# Patient Record
Sex: Female | Born: 1950 | Race: Black or African American | Hispanic: No | Marital: Married | Smoking: Never smoker
Health system: Southern US, Community
[De-identification: ages and names within clinical notes are randomized; demographics above are authoritative.]

## PROBLEM LIST (undated history)

## (undated) DIAGNOSIS — I10 Essential (primary) hypertension: Secondary | ICD-10-CM

## (undated) DIAGNOSIS — N179 Acute kidney failure, unspecified: Secondary | ICD-10-CM

## (undated) DIAGNOSIS — E7800 Pure hypercholesterolemia, unspecified: Secondary | ICD-10-CM

## (undated) HISTORY — DX: Acute kidney failure, unspecified: N17.9

## (undated) HISTORY — PX: TONSILLECTOMY: SUR1361

---

## 1997-11-06 ENCOUNTER — Encounter: Admission: RE | Admit: 1997-11-06 | Discharge: 1997-11-06 | Payer: Self-pay | Admitting: *Deleted

## 1998-03-23 ENCOUNTER — Encounter: Payer: Self-pay | Admitting: *Deleted

## 1998-03-23 ENCOUNTER — Emergency Department (HOSPITAL_COMMUNITY): Admission: EM | Admit: 1998-03-23 | Discharge: 1998-03-23 | Payer: Self-pay | Admitting: *Deleted

## 1998-10-08 ENCOUNTER — Encounter: Payer: Self-pay | Admitting: Emergency Medicine

## 1998-10-08 ENCOUNTER — Emergency Department (HOSPITAL_COMMUNITY): Admission: EM | Admit: 1998-10-08 | Discharge: 1998-10-08 | Payer: Self-pay | Admitting: Emergency Medicine

## 1999-05-30 ENCOUNTER — Emergency Department (HOSPITAL_COMMUNITY): Admission: EM | Admit: 1999-05-30 | Discharge: 1999-05-30 | Payer: Self-pay | Admitting: Emergency Medicine

## 1999-08-28 ENCOUNTER — Emergency Department (HOSPITAL_COMMUNITY): Admission: EM | Admit: 1999-08-28 | Discharge: 1999-08-28 | Payer: Self-pay | Admitting: *Deleted

## 2000-04-18 ENCOUNTER — Emergency Department (HOSPITAL_COMMUNITY): Admission: EM | Admit: 2000-04-18 | Discharge: 2000-04-18 | Payer: Self-pay | Admitting: Emergency Medicine

## 2000-04-28 ENCOUNTER — Encounter: Admission: RE | Admit: 2000-04-28 | Discharge: 2000-06-10 | Payer: Self-pay | Admitting: Orthopedic Surgery

## 2000-07-08 ENCOUNTER — Encounter: Admission: RE | Admit: 2000-07-08 | Discharge: 2000-08-17 | Payer: Self-pay | Admitting: Orthopedic Surgery

## 2000-07-28 ENCOUNTER — Encounter: Payer: Self-pay | Admitting: Orthopedic Surgery

## 2000-07-28 ENCOUNTER — Encounter: Admission: RE | Admit: 2000-07-28 | Discharge: 2000-07-28 | Payer: Self-pay | Admitting: Orthopedic Surgery

## 2000-09-16 ENCOUNTER — Encounter: Admission: RE | Admit: 2000-09-16 | Discharge: 2000-11-02 | Payer: Self-pay | Admitting: Orthopedic Surgery

## 2001-02-10 ENCOUNTER — Encounter: Payer: Self-pay | Admitting: Orthopedic Surgery

## 2001-02-10 ENCOUNTER — Encounter: Admission: RE | Admit: 2001-02-10 | Discharge: 2001-02-10 | Payer: Self-pay | Admitting: Orthopedic Surgery

## 2001-09-09 ENCOUNTER — Emergency Department (HOSPITAL_COMMUNITY): Admission: EM | Admit: 2001-09-09 | Discharge: 2001-09-09 | Payer: Self-pay

## 2001-10-29 ENCOUNTER — Encounter: Payer: Self-pay | Admitting: Chiropractic Medicine

## 2001-10-29 ENCOUNTER — Ambulatory Visit (HOSPITAL_COMMUNITY): Admission: RE | Admit: 2001-10-29 | Discharge: 2001-10-29 | Payer: Self-pay | Admitting: Chiropractic Medicine

## 2002-02-09 ENCOUNTER — Emergency Department (HOSPITAL_COMMUNITY): Admission: EM | Admit: 2002-02-09 | Discharge: 2002-02-09 | Payer: Self-pay | Admitting: Emergency Medicine

## 2002-03-28 ENCOUNTER — Emergency Department (HOSPITAL_COMMUNITY): Admission: EM | Admit: 2002-03-28 | Discharge: 2002-03-28 | Payer: Self-pay | Admitting: Emergency Medicine

## 2003-09-28 ENCOUNTER — Emergency Department (HOSPITAL_COMMUNITY): Admission: EM | Admit: 2003-09-28 | Discharge: 2003-09-28 | Payer: Self-pay | Admitting: Emergency Medicine

## 2004-03-13 ENCOUNTER — Emergency Department (HOSPITAL_COMMUNITY): Admission: EM | Admit: 2004-03-13 | Discharge: 2004-03-13 | Payer: Self-pay | Admitting: Emergency Medicine

## 2004-06-13 ENCOUNTER — Emergency Department (HOSPITAL_COMMUNITY): Admission: EM | Admit: 2004-06-13 | Discharge: 2004-06-13 | Payer: Self-pay | Admitting: *Deleted

## 2004-08-21 ENCOUNTER — Encounter: Admission: RE | Admit: 2004-08-21 | Discharge: 2004-08-21 | Payer: Self-pay | Admitting: Internal Medicine

## 2004-11-17 ENCOUNTER — Emergency Department (HOSPITAL_COMMUNITY): Admission: EM | Admit: 2004-11-17 | Discharge: 2004-11-17 | Payer: Self-pay | Admitting: Emergency Medicine

## 2005-11-17 ENCOUNTER — Emergency Department (HOSPITAL_COMMUNITY): Admission: EM | Admit: 2005-11-17 | Discharge: 2005-11-17 | Payer: Self-pay | Admitting: Emergency Medicine

## 2007-07-25 ENCOUNTER — Emergency Department (HOSPITAL_COMMUNITY): Admission: EM | Admit: 2007-07-25 | Discharge: 2007-07-25 | Payer: Self-pay | Admitting: Emergency Medicine

## 2007-08-31 ENCOUNTER — Encounter: Admission: RE | Admit: 2007-08-31 | Discharge: 2007-08-31 | Payer: Self-pay | Admitting: Internal Medicine

## 2007-12-23 ENCOUNTER — Emergency Department (HOSPITAL_COMMUNITY): Admission: EM | Admit: 2007-12-23 | Discharge: 2007-12-23 | Payer: Self-pay | Admitting: Emergency Medicine

## 2008-03-02 ENCOUNTER — Emergency Department (HOSPITAL_COMMUNITY): Admission: EM | Admit: 2008-03-02 | Discharge: 2008-03-02 | Payer: Self-pay | Admitting: Emergency Medicine

## 2009-07-02 ENCOUNTER — Emergency Department (HOSPITAL_COMMUNITY): Admission: EM | Admit: 2009-07-02 | Discharge: 2009-07-02 | Payer: Self-pay | Admitting: Emergency Medicine

## 2009-07-25 ENCOUNTER — Emergency Department (HOSPITAL_COMMUNITY): Admission: EM | Admit: 2009-07-25 | Discharge: 2009-07-25 | Payer: Self-pay | Admitting: Emergency Medicine

## 2010-05-29 ENCOUNTER — Emergency Department (HOSPITAL_COMMUNITY): Payer: BC Managed Care – PPO

## 2010-05-29 ENCOUNTER — Emergency Department (HOSPITAL_COMMUNITY)
Admission: EM | Admit: 2010-05-29 | Discharge: 2010-05-29 | Disposition: A | Discharge status: Home / Self Care | Payer: BC Managed Care – PPO | Attending: Emergency Medicine | Admitting: Emergency Medicine

## 2010-05-29 DIAGNOSIS — E7800 Pure hypercholesterolemia, unspecified: Secondary | ICD-10-CM | POA: Insufficient documentation

## 2010-05-29 DIAGNOSIS — R0789 Other chest pain: Secondary | ICD-10-CM | POA: Insufficient documentation

## 2010-05-29 DIAGNOSIS — I10 Essential (primary) hypertension: Secondary | ICD-10-CM | POA: Insufficient documentation

## 2010-05-29 DIAGNOSIS — E119 Type 2 diabetes mellitus without complications: Secondary | ICD-10-CM | POA: Insufficient documentation

## 2010-05-29 DIAGNOSIS — M25519 Pain in unspecified shoulder: Secondary | ICD-10-CM | POA: Insufficient documentation

## 2010-05-29 LAB — BASIC METABOLIC PANEL
BUN: 10 mg/dL (ref 6–23)
CO2: 29 mEq/L (ref 19–32)
Chloride: 105 mEq/L (ref 96–112)
Glucose, Bld: 104 mg/dL — ABNORMAL HIGH (ref 70–99)
Potassium: 4.3 mEq/L (ref 3.5–5.1)

## 2010-05-29 LAB — POCT CARDIAC MARKERS
Myoglobin, poc: 40 ng/mL (ref 12–200)
Troponin i, poc: <0.05 ng/mL (ref 0.00–0.09)

## 2010-05-29 LAB — DIFFERENTIAL
Basophils Absolute: 0 10*3/uL (ref 0.0–0.1)
Basophils Relative: 0 % (ref 0–1)
Eosinophils Absolute: 0.1 10*3/uL (ref 0.0–0.7)
Monocytes Absolute: 0.6 10*3/uL (ref 0.1–1.0)
Monocytes Relative: 8 % (ref 3–12)

## 2010-05-29 LAB — CBC
MCH: 26.9 pg (ref 26.0–34.0)
MCHC: 32.8 g/dL (ref 30.0–36.0)
Platelets: 272 10*3/uL (ref 150–400)
RDW: 15.7 % — ABNORMAL HIGH (ref 11.5–15.5)

## 2010-05-29 LAB — D-DIMER, QUANTITATIVE: D-Dimer, Quant: 0.24 ug/mL-FEU (ref 0.00–0.48)

## 2010-07-10 LAB — URINALYSIS, ROUTINE W REFLEX MICROSCOPIC
Bilirubin Urine: NEGATIVE
Hgb urine dipstick: NEGATIVE
Protein, ur: NEGATIVE mg/dL
Urobilinogen, UA: 1 mg/dL (ref 0.0–1.0)
pH: 7 (ref 5.0–8.0)

## 2010-07-12 LAB — URINALYSIS, ROUTINE W REFLEX MICROSCOPIC
Bilirubin Urine: NEGATIVE
Hgb urine dipstick: NEGATIVE
Ketones, ur: NEGATIVE mg/dL
Nitrite: NEGATIVE
Specific Gravity, Urine: 1.024 (ref 1.005–1.030)
pH: 6 (ref 5.0–8.0)

## 2010-07-12 LAB — CBC
HCT: 39.6 % (ref 36.0–46.0)
Hemoglobin: 12.9 g/dL (ref 12.0–15.0)
MCV: 84.5 fL (ref 78.0–100.0)
Platelets: 176 10*3/uL (ref 150–400)
WBC: 8.7 10*3/uL (ref 4.0–10.5)

## 2010-07-12 LAB — POCT I-STAT, CHEM 8
BUN: 17 mg/dL (ref 6–23)
Calcium, Ion: 1.14 mmol/L (ref 1.12–1.32)
Chloride: 102 mEq/L (ref 96–112)
Creatinine, Ser: 0.9 mg/dL (ref 0.4–1.2)
Glucose, Bld: 148 mg/dL — ABNORMAL HIGH (ref 70–99)
HCT: 43 % (ref 36.0–46.0)
Potassium: 3.6 mEq/L (ref 3.5–5.1)

## 2010-07-15 ENCOUNTER — Other Ambulatory Visit: Payer: Self-pay | Admitting: Internal Medicine

## 2010-07-15 ENCOUNTER — Other Ambulatory Visit: Payer: Self-pay | Admitting: Adult Health Nurse Practitioner

## 2010-07-15 ENCOUNTER — Ambulatory Visit
Admission: RE | Admit: 2010-07-15 | Discharge: 2010-07-15 | Disposition: A | Discharge status: Home / Self Care | Payer: BC Managed Care – PPO | Source: Ambulatory Visit | Attending: Internal Medicine | Admitting: Internal Medicine

## 2010-07-15 DIAGNOSIS — R202 Paresthesia of skin: Secondary | ICD-10-CM

## 2010-07-15 DIAGNOSIS — M545 Low back pain, unspecified: Secondary | ICD-10-CM

## 2010-07-15 DIAGNOSIS — Z1231 Encounter for screening mammogram for malignant neoplasm of breast: Secondary | ICD-10-CM

## 2010-07-23 ENCOUNTER — Ambulatory Visit
Admission: RE | Admit: 2010-07-23 | Discharge: 2010-07-23 | Disposition: A | Discharge status: Home / Self Care | Payer: BC Managed Care – PPO | Source: Ambulatory Visit | Attending: Internal Medicine | Admitting: Internal Medicine

## 2010-07-23 DIAGNOSIS — Z1231 Encounter for screening mammogram for malignant neoplasm of breast: Secondary | ICD-10-CM

## 2010-07-25 ENCOUNTER — Other Ambulatory Visit: Payer: Self-pay | Admitting: Internal Medicine

## 2010-07-25 DIAGNOSIS — R928 Other abnormal and inconclusive findings on diagnostic imaging of breast: Secondary | ICD-10-CM

## 2010-08-20 ENCOUNTER — Ambulatory Visit
Admission: RE | Admit: 2010-08-20 | Discharge: 2010-08-20 | Disposition: A | Discharge status: Home / Self Care | Payer: BC Managed Care – PPO | Source: Ambulatory Visit | Attending: Internal Medicine | Admitting: Internal Medicine

## 2010-08-20 DIAGNOSIS — R928 Other abnormal and inconclusive findings on diagnostic imaging of breast: Secondary | ICD-10-CM

## 2011-01-14 LAB — DIFFERENTIAL
Eosinophils Relative: 1
Lymphocytes Relative: 25
Lymphs Abs: 2.6
Monocytes Absolute: 0.7
Monocytes Relative: 7
Neutro Abs: 7.1

## 2011-01-14 LAB — POCT I-STAT, CHEM 8
BUN: 13
Calcium, Ion: 1.23
Chloride: 104
HCT: 41
Sodium: 141
TCO2: 29

## 2011-01-14 LAB — CBC
HCT: 38.4
Hemoglobin: 12.6
RBC: 4.7
WBC: 10.7 — ABNORMAL HIGH

## 2011-01-14 LAB — POCT CARDIAC MARKERS
CKMB, poc: 1.2
Troponin i, poc: <0.05

## 2011-01-22 LAB — CBC
HCT: 35.2 — ABNORMAL LOW
MCV: 83.2
Platelets: 267
RBC: 4.23
WBC: 10.4

## 2011-01-22 LAB — POCT I-STAT, CHEM 8
BUN: 18
Chloride: 105
Creatinine, Ser: 0.9
Sodium: 137

## 2011-01-22 LAB — D-DIMER, QUANTITATIVE: D-Dimer, Quant: <0.22

## 2011-01-22 LAB — POCT CARDIAC MARKERS
CKMB, poc: <1 — ABNORMAL LOW
Myoglobin, poc: 45.7
Troponin i, poc: <0.05

## 2011-01-22 LAB — DIFFERENTIAL
Eosinophils Absolute: 0.1
Lymphocytes Relative: 29
Lymphs Abs: 3
Monocytes Relative: 6
Neutrophils Relative %: 63

## 2011-04-14 ENCOUNTER — Emergency Department (HOSPITAL_COMMUNITY): Payer: BC Managed Care – PPO

## 2011-04-14 ENCOUNTER — Other Ambulatory Visit: Payer: Self-pay

## 2011-04-14 ENCOUNTER — Emergency Department (HOSPITAL_COMMUNITY)
Admission: EM | Admit: 2011-04-14 | Discharge: 2011-04-14 | Disposition: A | Discharge status: Home / Self Care | Payer: BC Managed Care – PPO | Attending: Emergency Medicine | Admitting: Emergency Medicine

## 2011-04-14 ENCOUNTER — Encounter (HOSPITAL_COMMUNITY): Payer: Self-pay | Admitting: Emergency Medicine

## 2011-04-14 ENCOUNTER — Emergency Department (HOSPITAL_COMMUNITY)
Admission: EM | Admit: 2011-04-14 | Discharge: 2011-04-14 | Disposition: A | Discharge status: Nursing Home (Includes ICF) | Payer: BC Managed Care – PPO | Source: Home / Self Care | Attending: Family Medicine | Admitting: Family Medicine

## 2011-04-14 ENCOUNTER — Encounter (HOSPITAL_COMMUNITY): Payer: Self-pay | Admitting: *Deleted

## 2011-04-14 DIAGNOSIS — R079 Chest pain, unspecified: Secondary | ICD-10-CM | POA: Insufficient documentation

## 2011-04-14 DIAGNOSIS — R0602 Shortness of breath: Secondary | ICD-10-CM | POA: Insufficient documentation

## 2011-04-14 DIAGNOSIS — Z79899 Other long term (current) drug therapy: Secondary | ICD-10-CM | POA: Insufficient documentation

## 2011-04-14 DIAGNOSIS — E119 Type 2 diabetes mellitus without complications: Secondary | ICD-10-CM | POA: Insufficient documentation

## 2011-04-14 DIAGNOSIS — Z7982 Long term (current) use of aspirin: Secondary | ICD-10-CM | POA: Insufficient documentation

## 2011-04-14 DIAGNOSIS — M546 Pain in thoracic spine: Secondary | ICD-10-CM | POA: Insufficient documentation

## 2011-04-14 DIAGNOSIS — I10 Essential (primary) hypertension: Secondary | ICD-10-CM | POA: Insufficient documentation

## 2011-04-14 HISTORY — DX: Essential (primary) hypertension: I10

## 2011-04-14 LAB — POCT I-STAT, CHEM 8
Calcium, Ion: 1.22 mmol/L (ref 1.12–1.32)
Creatinine, Ser: 0.7 mg/dL (ref 0.50–1.10)
Glucose, Bld: 103 mg/dL — ABNORMAL HIGH (ref 70–99)
HCT: 40 % (ref 36.0–46.0)
Hemoglobin: 13.6 g/dL (ref 12.0–15.0)
TCO2: 28 mmol/L (ref 0–100)

## 2011-04-14 LAB — TROPONIN I: Troponin I: <0.3 ng/mL (ref <0.30)

## 2011-04-14 LAB — CK TOTAL AND CKMB (NOT AT ARMC)
CK, MB: 1.7 ng/mL (ref 0.3–4.0)
Total CK: 85 U/L (ref 7–177)

## 2011-04-14 MED ORDER — HYDROCODONE-ACETAMINOPHEN 5-325 MG PO TABS
2.0000 | ORAL_TABLET | Freq: Once | ORAL | Status: AC
  Administered 2011-04-14: 2 via ORAL
  Filled 2011-04-14 (×2): qty 1

## 2011-04-14 MED ORDER — IOHEXOL 350 MG/ML SOLN
100.0000 mL | Freq: Once | INTRAVENOUS | Status: AC | PRN
  Administered 2011-04-14: 100 mL via INTRAVENOUS

## 2011-04-14 MED ORDER — HYDROCODONE-ACETAMINOPHEN 5-500 MG PO TABS: 1.0000 | ORAL_TABLET | Freq: Four times a day (QID) | ORAL | Status: AC | PRN

## 2011-04-14 NOTE — ED Provider Notes (Signed)
History     CSN: [id number]  Arrival date & time 04/14/11  1505   First MD Initiated Contact with Patient 04/14/11 1514      Chief Complaint  Patient presents with  . Chest Pain    (Consider location/radiation/quality/duration/timing/severity/associated sxs/prior treatment) HPI Comments: Christy Higgins presents for evaluation of RIGHT sided chest pain described as sharp, radiating to her RIGHT back. She states that it started last evening at rest. She also reports an episode of dizziness, and shortness of breath this morning requiring her to have to sit down. She denies any numbness, tingling, vision changes, nausea, sweating. She denies any hx of MI. She does have HTN and DM. She does not smoke.   Patient is a 60 y.o. female presenting with chest pain. The history is provided by the patient.  Chest Pain The chest pain began yesterday. Chest pain occurs intermittently. The chest pain is unchanged. The severity of the pain is mild. The quality of the pain is described as aching and sharp. The pain radiates to the upper back. Primary symptoms include shortness of breath and dizziness.     Past Medical History  Diagnosis Date  . Hypertension   . Diabetes mellitus     Past Surgical History  Procedure Date  . Tonsillectomy   . Cesarean section     History reviewed. No pertinent family history.  History  Substance Use Topics  . Smoking status: Never Smoker   . Smokeless tobacco: Not on file  . Alcohol Use: No    OB History    Grav Para Term Preterm Abortions TAB SAB Ect Mult Living                  Review of Systems  Constitutional: Negative.   HENT: Negative.   Eyes: Negative.   Respiratory: Positive for shortness of breath.   Cardiovascular: Positive for chest pain.  Gastrointestinal: Negative.   Genitourinary: Negative.   Musculoskeletal: Negative.   Skin: Negative.   Neurological: Positive for dizziness.    Allergies  Review of patient's allergies indicates no  known allergies.  Home Medications   Current Outpatient Rx  Name Route Sig Dispense Refill  . ASPIRIN EC 81 MG PO TBEC Oral Take 81 mg by mouth as needed. For pain....only takes when needed.     Marland Kitchen LISINOPRIL-HYDROCHLOROTHIAZIDE 10-12.5 MG PO TABS Oral Take 1 tablet by mouth daily.      Marland Kitchen METFORMIN HCL 500 MG PO TABS Oral Take 500 mg by mouth daily.        Pulse 108  Temp(Src) 98.3 F (36.8 C) (Oral)  Resp 18  SpO2 99%  Physical Exam  Nursing note and vitals reviewed. Constitutional: She is oriented to person, place, and time. She appears well-developed and well-nourished.  HENT:  Head: Normocephalic and atraumatic.  Eyes: EOM are normal.  Neck: Normal range of motion.  Cardiovascular: Normal rate, regular rhythm and normal heart sounds.   No murmur heard. Pulmonary/Chest: Effort normal and breath sounds normal.  Musculoskeletal: Normal range of motion.  Neurological: She is alert and oriented to person, place, and time.  Skin: Skin is warm and dry.  Psychiatric: Her behavior is normal.    ED Course  Procedures (including critical care time)  Labs Reviewed - No data to display No results found.   1. Chest pain       MDM  Transferred to [facility] ED for further evaluation        Richardo Priest, MD 04/27/11  1248 

## 2011-04-14 NOTE — ED Notes (Signed)
Patient sent from [facility] for further evaluation of her right sided chest pain that radiates to her back.  Symptoms started last pm.

## 2011-04-14 NOTE — ED Provider Notes (Signed)
History     CSN: [id number]  Arrival date & time 04/14/11  1625   First MD Initiated Contact with Patient 04/14/11 1712      Chief Complaint  Patient presents with  . Chest Pain    (Consider location/radiation/quality/duration/timing/severity/associated sxs/prior treatment) Patient is a 60 y.o. female presenting with chest pain. The history is provided by the patient. No language interpreter was used.  Chest Pain The chest pain began yesterday. Duration of episode(s) is 2 days. Chest pain occurs constantly. The chest pain is unchanged. Associated with: worse with breathing, constant all the time. At its most intense, the pain is at 10/10. The pain is currently at 8/10. The severity of the pain is moderate. The quality of the pain is described as sharp (Located in R chest). The pain radiates to the upper back. Primary symptoms include shortness of breath (mild). Pertinent negatives for primary symptoms include no fever, no cough, no wheezing, no nausea, no vomiting and no dizziness. She tried nothing for the symptoms.     Past Medical History  Diagnosis Date  . Hypertension   . Diabetes mellitus     Past Surgical History  Procedure Date  . Tonsillectomy   . Cesarean section     History reviewed. No pertinent family history.  History  Substance Use Topics  . Smoking status: Never Smoker   . Smokeless tobacco: Not on file  . Alcohol Use: No    OB History    Grav Para Term Preterm Abortions TAB SAB Ect Mult Living                  Review of Systems  Constitutional: Negative for fever.  Respiratory: Positive for shortness of breath (mild). Negative for cough and wheezing.   Cardiovascular: Positive for chest pain.  Gastrointestinal: Negative for nausea and vomiting.  Neurological: Negative for dizziness.  All other systems reviewed and are negative.    Allergies  Review of patient's allergies indicates no known allergies.  Home Medications   Current  Outpatient Rx  Name Route Sig Dispense Refill  . ASPIRIN EC 81 MG PO TBEC Oral Take 81 mg by mouth as needed. For pain....only takes when needed.     Marland Kitchen LISINOPRIL-HYDROCHLOROTHIAZIDE 10-12.5 MG PO TABS Oral Take 1 tablet by mouth daily.      Marland Kitchen METFORMIN HCL 500 MG PO TABS Oral Take 500 mg by mouth daily.        BP 116/87  Pulse 99  Temp(Src) 98.4 F (36.9 C) (Oral)  Resp 18  Ht 5' (1.524 m)  Wt 128 lb (58.06 kg)  BMI 25.00 kg/m2  SpO2 98%  Physical Exam  Nursing note and vitals reviewed. Constitutional: She is oriented to person, place, and time. She appears well-developed and well-nourished. No distress.  HENT:  Head: Normocephalic and atraumatic.  Eyes: EOM are normal. Pupils are equal, round, and reactive to light.  Neck: Normal range of motion. Neck supple.  Cardiovascular: Normal rate and regular rhythm.  Exam reveals no friction rub.   No murmur heard. Pulmonary/Chest: Effort normal and breath sounds normal. No respiratory distress. She has no wheezes. She has no rales.  Abdominal: Soft. She exhibits no distension. There is no tenderness. There is no rebound.  Musculoskeletal: Normal range of motion. She exhibits no edema.  Neurological: She is alert and oriented to person, place, and time.  Skin: She is not diaphoretic.    ED Course  Procedures (including critical care time)  Labs Reviewed  POCT I-STAT, CHEM 8 - Abnormal; Notable for the following:    Glucose, Bld 103 (*)    All other components within normal limits  TROPONIN I  CK TOTAL AND CKMB  PROTIME-INR  I-STAT, CHEM 8   Dg Chest 2 View  04/14/2011  *RADIOLOGY REPORT*  Clinical Data: Lambert Mody right-sided chest pain  CHEST - 2 VIEW  Comparison: Chest radiograph 05/29/2010  Findings: Normal mediastinum and heart silhouette.  There is a left retrocardiac opacity suggesting air space disease.  Low lung volumes on the lateral projection.  No pneumothorax.  IMPRESSION: Concern for  left lower lobe pneumonia versus  atelectasis.  Original Report Authenticated By: Genevive Bi, M.D.    Date: 04/14/2011  Rate: 113  Rhythm: sinus tachycardia  QRS Axis: normal  Intervals: normal  ST/T Wave abnormalities: nonspecific ST/T changes  Conduction Disutrbances:none  Narrative Interpretation:   Old EKG Reviewed: none available  Protime-INR (Final result)   Component (Lab Inquiry)      Result Time  Prothrombin Time  INR    04/14/11 1823  13.5  1.01         Troponin I (Final result)   Component (Lab Inquiry)      Result Time  TROPONIN I    04/14/11 1752  <0.30 Due to the release kinetics of cTnI, a negative result within the first hours of the onset of symptoms does not rule out myocardial infarction with certainty. If myocardial infarction is still suspected, repeat the test at appropriate intervals.         CK total and CKMB (Final result)   Component (Lab Inquiry)      Result Time  CK, TOTAL  CK, MB  Relative Index    04/14/11 1752  85  1.7  RELATIVE INDEX IS INVALID WHEN CK < 100 U/L          I-STAT, chem 8 (Final result)  Abnormal  Component (Lab Inquiry)      Result Time  NA  K  CL  BUN  Creatinine, Ser    04/14/11 1723  143  3.9  107  11  0.70           Result Time  GLUCOSE  CA ION  TCO2  HGB  HCT    04/14/11 1723  103 (H)  1.22  28  13.6  40.0          Imaging Results         CT Angio Chest W/Cm &/Or Wo Cm (Final result)   Result time:04/14/11 2010    Final result by Rad Results In Interface (04/14/11 20:10:51)    Narrative:   *RADIOLOGY REPORT*  Clinical Data: Concern pulmonary embolism. Short of breath.  CT ANGIOGRAPHY CHEST WITH CONTRAST  Technique: Multidetector CT imaging of the chest was performed using the standard protocol during bolus administration of intravenous contrast. Multiplanar CT image reconstructions including MIPs were obtained to evaluate the vascular anatomy.  Contrast: OMNIPAQUE IOHEXOL 350 MG/ML IV SOLN  Comparison: Chest  radiograph 04/14/2011  Findings: Exam is degraded by patient respiratory motion. No filling defects within the proximal segmental pulmonary arteries. The lower lobe pulmonary arteries are poorly evaluated. No acute findings of the aorta or great vessels. No pericardial fluid. Esophagus is normal.  The lungs are excretory. There is atelectasis in left and right lung base. There is mild peri bronchial thickening in the left lower lobe. Findings most suggestive of atelectasis or aspiration pneumonitis. No consolidation present. Airways are  normal.  Limited view of the upper abdomen is unremarkable.  Limited view of the skeleton demonstrates osteophytosis of the spine.  Review of the MIP images confirms the above findings.  IMPRESSION:  1. No evidence of acute pulmonary embolism in exam degraded by patient respiratory motion.  2. Atelectasis versus aspiration pneumonitis at the lung bases.  Original Report Authenticated By: Genevive Bi, M.D.            DG Chest 2 View (Final result)   Result time:04/14/11 1840    Final result by Rad Results In Interface (04/14/11 18:40:17)    Narrative:   *RADIOLOGY REPORT*  Clinical Data: Lambert Mody right-sided chest pain  CHEST - 2 VIEW  Comparison: Chest radiograph 05/29/2010  Findings: Normal mediastinum and heart silhouette. There is a left retrocardiac opacity suggesting air space disease. Low lung volumes on the lateral projection. No pneumothorax.  IMPRESSION: Concern for left lower lobe pneumonia versus atelectasis.  Original Report Authenticated By: Genevive Bi, M.D.         1. Chest pain       MDM  51F p/w chest pain since yesterday. R sided, radiating to back. Reported dyspnea in triage, however denied to me. Radiates to L side of back also. No alleviating/exacerbating/instigating factors. Constant pain. Seen initially at Urgent Care, sent here for further eval. Hx of HTN and DM.  Afebrile, sinus  tachycardia on the monitor with rate 100-110. Normotensive. Pulse ox with good waveform between 97 and 100% on room air. Exam benign, lungs clear, abdomen soft. No peripheral edema. Concern for ACS vs PE. Initial troponin negative, no need for another since over 24 hours of pain, should have positive troponin if myocardial damage present. EKG shows sinus tach with mild ST/T wave changes, nonspecific. Large QRS complexes c/w LVH pattern. CXR reviewed by me, possible LLL pneumonia vs atelectasis. No reproducible chest wall tenderness, and chest pain pleuritic at times; moderate risk for PE by Wells Criteria. CT PE scan ordered. PE scan, reviewed by me, read as no PE, concern for possible atelectasis vs aspiration pneumonitis on CT. Clinically no PNA, findings likely atelectasis. Instructed to f/u with regular doctor for chest pain and given Vicodin for chest pain. Given numbers for primary doctors within the community to f/u with.       Elwin Mocha, MD 04/14/11 2035

## 2011-04-14 NOTE — ED Notes (Signed)
Right chest pain that radiates to her back, sob and dizziness.  Pain in right chest is sharp.  Onset of -pain yesterday , continuous.  Associated with intermittent episodes of sob and dizziness.  Denies nausea, vomiting.  Pain in chest is worse with movement of right arm.  Pain worse with lying down, unable to get comfortable.  Has been sleeping reclined

## 2011-04-14 NOTE — ED Notes (Signed)
;  pt ambualted with a steady gait; VSS; no signs of distress; respirations even and unlabored; skin warm and dry; no questions at this time; husband to drive her home.

## 2011-04-14 NOTE — ED Notes (Signed)
Patient transported to CT 

## 2011-04-14 NOTE — ED Notes (Signed)
Pt returned from CT angio. Back on monitor.

## 2011-04-15 NOTE — ED Provider Notes (Signed)
The patient is partially reproducible right upper chest wall tenderness  I saw and evaluated the patient, reviewed the resident's note and I agree with the findings and plan including the ECG interpretation.    Hurman Horn, MD 04/15/11 [phone]

## 2011-09-08 ENCOUNTER — Other Ambulatory Visit (HOSPITAL_COMMUNITY): Payer: Self-pay | Admitting: Nurse Practitioner

## 2011-09-08 DIAGNOSIS — Z1231 Encounter for screening mammogram for malignant neoplasm of breast: Secondary | ICD-10-CM

## 2011-09-10 ENCOUNTER — Ambulatory Visit (HOSPITAL_COMMUNITY)
Admission: RE | Admit: 2011-09-10 | Discharge: 2011-09-10 | Disposition: A | Discharge status: Home / Self Care | Payer: BC Managed Care – PPO | Source: Ambulatory Visit | Attending: Nurse Practitioner | Admitting: Nurse Practitioner

## 2011-09-10 DIAGNOSIS — Z1231 Encounter for screening mammogram for malignant neoplasm of breast: Secondary | ICD-10-CM | POA: Insufficient documentation

## 2011-09-18 ENCOUNTER — Other Ambulatory Visit: Payer: Self-pay | Admitting: Nurse Practitioner

## 2011-09-18 DIAGNOSIS — R928 Other abnormal and inconclusive findings on diagnostic imaging of breast: Secondary | ICD-10-CM

## 2011-10-07 ENCOUNTER — Ambulatory Visit
Admission: RE | Admit: 2011-10-07 | Discharge: 2011-10-07 | Disposition: A | Discharge status: Home / Self Care | Payer: BC Managed Care – PPO | Source: Ambulatory Visit | Attending: Nurse Practitioner | Admitting: Nurse Practitioner

## 2011-10-07 DIAGNOSIS — R928 Other abnormal and inconclusive findings on diagnostic imaging of breast: Secondary | ICD-10-CM

## 2011-12-01 ENCOUNTER — Encounter (HOSPITAL_COMMUNITY): Payer: Self-pay | Admitting: Emergency Medicine

## 2011-12-01 ENCOUNTER — Emergency Department (HOSPITAL_COMMUNITY)
Admission: EM | Admit: 2011-12-01 | Discharge: 2011-12-01 | Disposition: A | Discharge status: Home / Self Care | Payer: BC Managed Care – PPO | Attending: Emergency Medicine | Admitting: Emergency Medicine

## 2011-12-01 DIAGNOSIS — X58XXXA Exposure to other specified factors, initial encounter: Secondary | ICD-10-CM | POA: Insufficient documentation

## 2011-12-01 DIAGNOSIS — T464X5A Adverse effect of angiotensin-converting-enzyme inhibitors, initial encounter: Secondary | ICD-10-CM

## 2011-12-01 DIAGNOSIS — E119 Type 2 diabetes mellitus without complications: Secondary | ICD-10-CM | POA: Insufficient documentation

## 2011-12-01 DIAGNOSIS — I10 Essential (primary) hypertension: Secondary | ICD-10-CM | POA: Insufficient documentation

## 2011-12-01 DIAGNOSIS — T783XXA Angioneurotic edema, initial encounter: Secondary | ICD-10-CM | POA: Insufficient documentation

## 2011-12-01 DIAGNOSIS — E7800 Pure hypercholesterolemia, unspecified: Secondary | ICD-10-CM | POA: Insufficient documentation

## 2011-12-01 HISTORY — DX: Pure hypercholesterolemia, unspecified: E78.00

## 2011-12-01 MED ORDER — FAMOTIDINE 20 MG PO TABS
20.0000 mg | ORAL_TABLET | Freq: Once | ORAL | Status: AC
  Administered 2011-12-01: 20 mg via ORAL
  Filled 2011-12-01: qty 1

## 2011-12-01 MED ORDER — METHYLPREDNISOLONE SODIUM SUCC 125 MG IJ SOLR
125.0000 mg | Freq: Once | INTRAMUSCULAR | Status: AC
  Administered 2011-12-01: 125 mg via INTRAVENOUS
  Filled 2011-12-01: qty 2

## 2011-12-01 MED ORDER — DIPHENHYDRAMINE HCL 50 MG/ML IJ SOLN
25.0000 mg | Freq: Once | INTRAMUSCULAR | Status: AC
  Administered 2011-12-01: 25 mg via INTRAVENOUS
  Filled 2011-12-01: qty 1

## 2011-12-01 MED ORDER — PREDNISONE 20 MG PO TABS: 60.0000 mg | ORAL_TABLET | Freq: Every day | ORAL | Status: AC

## 2011-12-01 MED ORDER — FAMOTIDINE 20 MG PO TABS: 20.0000 mg | ORAL_TABLET | Freq: Two times a day (BID) | ORAL | Status: DC

## 2011-12-01 MED ORDER — DIPHENHYDRAMINE HCL 25 MG PO TABS: 25.0000 mg | ORAL_TABLET | Freq: Four times a day (QID) | ORAL | Status: DC

## 2011-12-01 NOTE — ED Notes (Signed)
C/o swelling to L side of tongue x 1 hour.  Speaking in complete sentences.  NAD.

## 2011-12-01 NOTE — ED Provider Notes (Signed)
History     CSN: [id number]  Arrival date & time 12/01/11  0147   First MD Initiated Contact with Patient 12/01/11 0222      Chief Complaint  Patient presents with  . Oral Swelling    (Consider location/radiation/quality/duration/timing/severity/associated sxs/prior treatment) HPI History provided by patient. Left-sided tongue swelling started tonight while at home. No history of same. No throat swelling or difficulty breathing. Has been taking lisinopril for years without any history of angioedema. No family history of angioedema. No chest pain. No rash. No itching. Mild to moderate in severity. No new medications or new known exposures. Past Medical History  Diagnosis Date  . Hypertension   . Diabetes mellitus   . High cholesterol     Past Surgical History  Procedure Date  . Tonsillectomy   . Cesarean section     No family history on file.  History  Substance Use Topics  . Smoking status: Never Smoker   . Smokeless tobacco: Not on file  . Alcohol Use: No    OB History    Grav Para Term Preterm Abortions TAB SAB Ect Mult Living                  Review of Systems  Constitutional: Negative for fever and chills.  HENT: Negative for neck pain and neck stiffness.   Eyes: Negative for pain.  Respiratory: Negative for shortness of breath.   Cardiovascular: Negative for chest pain.  Gastrointestinal: Negative for abdominal pain.  Genitourinary: Negative for dysuria.  Musculoskeletal: Negative for back pain.  Skin: Negative for rash.  Neurological: Negative for headaches.  All other systems reviewed and are negative.    Allergies  Review of patient's allergies indicates no known allergies.  Home Medications   Current Outpatient Rx  Name Route Sig Dispense Refill  . ASPIRIN EC 81 MG PO TBEC Oral Take 81 mg by mouth as needed. For pain....only takes when needed.     Marland Kitchen LISINOPRIL-HYDROCHLOROTHIAZIDE 10-12.5 MG PO TABS Oral Take 1 tablet by mouth daily.      Marland Kitchen  METFORMIN HCL 500 MG PO TABS Oral Take 500 mg by mouth daily.        BP 132/89  Pulse 100  Temp 97.6 F (36.4 C) (Oral)  Resp 16  SpO2 100%  Physical Exam  Constitutional: She is oriented to person, place, and time. She appears well-developed and well-nourished.  HENT:  Head: Normocephalic and atraumatic.       Mild left tongue angioedema. View of midline. No lip or oral swelling otherwise.  Eyes: Conjunctivae and EOM are normal. Pupils are equal, round, and reactive to light.  Neck: Trachea normal. Neck supple. No thyromegaly present.  Cardiovascular: Normal rate, regular rhythm, S1 normal, S2 normal and normal pulses.     No systolic murmur is present   No diastolic murmur is present  Pulses:      Radial pulses are 2+ on the right side, and 2+ on the left side.  Pulmonary/Chest: Effort normal and breath sounds normal. No stridor. She has no wheezes. She has no rhonchi.  Abdominal: Soft. Normal appearance and bowel sounds are normal. There is no tenderness. There is no CVA tenderness and negative Murphy's sign.  Musculoskeletal:       BLE:s Calves nontender, no cords or erythema, negative Homans sign  Neurological: She is alert and oriented to person, place, and time. She has normal strength. No cranial nerve deficit or sensory deficit. GCS eye subscore is 4.  GCS verbal subscore is 5. GCS motor subscore is 6.  Skin: Skin is warm and dry. No rash noted. She is not diaphoretic.  Psychiatric: Her speech is normal.       Cooperative and appropriate    ED Course  Procedures (including critical care time)  IV Solu-Medrol, , Benadryl. Pepcid.  Serial evaluations with improving condition.  Mild angioedema. Plan stop lisinopril and followup primary care physician for medication recommendations. Reliable historian in stable for discharge home, states understanding strict return precautions. Return precautions provided.  MDM   Nursing notes reviewed. Vital signs reviewed. Room air  pulse ox 100% is adequate. Medications provided as above. Angioedema precautions provided        Sunnie Nielsen, MD 12/01/11 [phone]

## 2011-12-01 NOTE — ED Notes (Signed)
Pt states swelling of left side of tongue onset 0100 today. Denies difficulty swallowing,SOB.   Has been taking apresoline  For many years for BP.

## 2013-04-27 ENCOUNTER — Ambulatory Visit (INDEPENDENT_AMBULATORY_CARE_PROVIDER_SITE_OTHER): Payer: BC Managed Care – PPO | Admitting: Podiatry

## 2013-04-27 ENCOUNTER — Encounter: Payer: Self-pay | Admitting: Podiatry

## 2013-04-27 ENCOUNTER — Ambulatory Visit (INDEPENDENT_AMBULATORY_CARE_PROVIDER_SITE_OTHER): Payer: BC Managed Care – PPO

## 2013-04-27 VITALS — BP 102/61 | HR 97 | Resp 12 | Ht 60.0 in | Wt 135.0 lb

## 2013-04-27 DIAGNOSIS — S93609A Unspecified sprain of unspecified foot, initial encounter: Secondary | ICD-10-CM

## 2013-04-27 DIAGNOSIS — S92919A Unspecified fracture of unspecified toe(s), initial encounter for closed fracture: Secondary | ICD-10-CM

## 2013-04-27 DIAGNOSIS — R52 Pain, unspecified: Secondary | ICD-10-CM

## 2013-04-27 NOTE — Progress Notes (Signed)
N-SORE L-LT FOOT 3RD TOE D-2 WEEKS O-SUDDENLY C-BETTER A-PRESSURE T-IBUPROFEN, SOAK

## 2013-04-28 NOTE — Progress Notes (Signed)
Subjective:     Patient ID: Christy Higgins, female   DOB: 06/05/1950, 63 y.o.   MRN: [id number]  HPI patient presents stating my left third toe is swollen and painful after I traumatized it 2 weeks ago and I am worried I may have broken it   Review of Systems     Objective:   Physical Exam Neurovascular status intact with no health history changes noted and there is mild edema in the third toe left foot with pain at the proximal interphalangeal joint    Assessment:     Possible fracture left third toe versus contusion    Plan:     X-ray and H&P reviewed and today I advised that this will take 6-8 weeks to heal and if not better we will consider injection treatment

## 2014-03-13 ENCOUNTER — Other Ambulatory Visit: Payer: Self-pay

## 2014-03-13 DIAGNOSIS — Z1231 Encounter for screening mammogram for malignant neoplasm of breast: Secondary | ICD-10-CM

## 2014-03-30 ENCOUNTER — Ambulatory Visit
Admission: RE | Admit: 2014-03-30 | Discharge: 2014-03-30 | Disposition: A | Discharge status: Home / Self Care | Payer: BC Managed Care – PPO | Source: Ambulatory Visit

## 2014-03-30 DIAGNOSIS — Z1231 Encounter for screening mammogram for malignant neoplasm of breast: Secondary | ICD-10-CM

## 2016-01-13 ENCOUNTER — Encounter (HOSPITAL_COMMUNITY): Payer: Self-pay | Admitting: *Deleted

## 2016-01-13 ENCOUNTER — Emergency Department (HOSPITAL_COMMUNITY): Payer: BC Managed Care – PPO

## 2016-01-13 ENCOUNTER — Emergency Department (HOSPITAL_COMMUNITY)
Admission: EM | Admit: 2016-01-13 | Discharge: 2016-01-13 | Disposition: A | Discharge status: Home / Self Care | Payer: BC Managed Care – PPO | Attending: Emergency Medicine | Admitting: Emergency Medicine

## 2016-01-13 DIAGNOSIS — Y999 Unspecified external cause status: Secondary | ICD-10-CM | POA: Diagnosis not present

## 2016-01-13 DIAGNOSIS — T07XXXA Unspecified multiple injuries, initial encounter: Secondary | ICD-10-CM

## 2016-01-13 DIAGNOSIS — Y939 Activity, unspecified: Secondary | ICD-10-CM | POA: Insufficient documentation

## 2016-01-13 DIAGNOSIS — S6991XA Unspecified injury of right wrist, hand and finger(s), initial encounter: Secondary | ICD-10-CM | POA: Diagnosis not present

## 2016-01-13 DIAGNOSIS — I10 Essential (primary) hypertension: Secondary | ICD-10-CM | POA: Insufficient documentation

## 2016-01-13 DIAGNOSIS — S299XXA Unspecified injury of thorax, initial encounter: Secondary | ICD-10-CM | POA: Diagnosis present

## 2016-01-13 DIAGNOSIS — S0081XA Abrasion of other part of head, initial encounter: Secondary | ICD-10-CM | POA: Diagnosis not present

## 2016-01-13 DIAGNOSIS — Y92410 Unspecified street and highway as the place of occurrence of the external cause: Secondary | ICD-10-CM | POA: Insufficient documentation

## 2016-01-13 DIAGNOSIS — Z7984 Long term (current) use of oral hypoglycemic drugs: Secondary | ICD-10-CM | POA: Insufficient documentation

## 2016-01-13 DIAGNOSIS — S59901A Unspecified injury of right elbow, initial encounter: Secondary | ICD-10-CM | POA: Insufficient documentation

## 2016-01-13 DIAGNOSIS — S20319A Abrasion of unspecified front wall of thorax, initial encounter: Secondary | ICD-10-CM | POA: Insufficient documentation

## 2016-01-13 DIAGNOSIS — E119 Type 2 diabetes mellitus without complications: Secondary | ICD-10-CM | POA: Diagnosis not present

## 2016-01-13 MED ORDER — NAPROXEN 250 MG PO TABS
500.0000 mg | ORAL_TABLET | Freq: Once | ORAL | Status: AC
  Administered 2016-01-13: 500 mg via ORAL
  Filled 2016-01-13: qty 2

## 2016-01-13 MED ORDER — NAPROXEN 500 MG PO TABS: 500.0000 mg | ORAL_TABLET | Freq: Two times a day (BID) | ORAL | 0 refills | Status: AC

## 2016-01-13 MED ORDER — BACITRACIN ZINC 500 UNIT/GM EX OINT
TOPICAL_OINTMENT | Freq: Once | CUTANEOUS | Status: AC
  Administered 2016-01-13: 1 via TOPICAL
  Filled 2016-01-13: qty 0.9

## 2016-01-13 NOTE — ED Notes (Signed)
Patient went to xray 
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
## 2016-03-06 ENCOUNTER — Emergency Department (HOSPITAL_COMMUNITY): Payer: BC Managed Care – PPO

## 2016-03-06 ENCOUNTER — Other Ambulatory Visit: Payer: Self-pay

## 2016-03-06 ENCOUNTER — Emergency Department (HOSPITAL_COMMUNITY)
Admission: EM | Admit: 2016-03-06 | Discharge: 2016-03-06 | Disposition: A | Discharge status: Home / Self Care | Payer: BC Managed Care – PPO | Attending: Emergency Medicine | Admitting: Emergency Medicine

## 2016-03-06 ENCOUNTER — Encounter (HOSPITAL_COMMUNITY): Payer: Self-pay | Admitting: Emergency Medicine

## 2016-03-06 DIAGNOSIS — Z7982 Long term (current) use of aspirin: Secondary | ICD-10-CM | POA: Insufficient documentation

## 2016-03-06 DIAGNOSIS — Y939 Activity, unspecified: Secondary | ICD-10-CM | POA: Diagnosis not present

## 2016-03-06 DIAGNOSIS — I10 Essential (primary) hypertension: Secondary | ICD-10-CM | POA: Diagnosis not present

## 2016-03-06 DIAGNOSIS — Y929 Unspecified place or not applicable: Secondary | ICD-10-CM | POA: Insufficient documentation

## 2016-03-06 DIAGNOSIS — Y990 Civilian activity done for income or pay: Secondary | ICD-10-CM | POA: Insufficient documentation

## 2016-03-06 DIAGNOSIS — M542 Cervicalgia: Secondary | ICD-10-CM | POA: Diagnosis present

## 2016-03-06 DIAGNOSIS — W19XXXA Unspecified fall, initial encounter: Secondary | ICD-10-CM | POA: Insufficient documentation

## 2016-03-06 DIAGNOSIS — Z7984 Long term (current) use of oral hypoglycemic drugs: Secondary | ICD-10-CM | POA: Diagnosis not present

## 2016-03-06 DIAGNOSIS — R51 Headache: Secondary | ICD-10-CM | POA: Insufficient documentation

## 2016-03-06 DIAGNOSIS — E119 Type 2 diabetes mellitus without complications: Secondary | ICD-10-CM | POA: Diagnosis not present

## 2016-03-06 LAB — CBC WITH DIFFERENTIAL/PLATELET
BASOS ABS: 0 10*3/uL (ref 0.0–0.1)
BASOS PCT: 0 %
Eosinophils Absolute: 0.1 10*3/uL (ref 0.0–0.7)
Eosinophils Relative: 1 %
HEMATOCRIT: 38.7 % (ref 36.0–46.0)
Hemoglobin: 12.2 g/dL (ref 12.0–15.0)
Lymphocytes Relative: 21 %
Lymphs Abs: 2.1 10*3/uL (ref 0.7–4.0)
MCH: 26.8 pg (ref 26.0–34.0)
MCHC: 31.5 g/dL (ref 30.0–36.0)
MCV: 85.1 fL (ref 78.0–100.0)
MONO ABS: 0.9 10*3/uL (ref 0.1–1.0)
Monocytes Relative: 9 %
NEUTROS ABS: 6.8 10*3/uL (ref 1.7–7.7)
NEUTROS PCT: 69 %
Platelets: 208 10*3/uL (ref 150–400)
RBC: 4.55 MIL/uL (ref 3.87–5.11)
RDW: 15.8 % — AB (ref 11.5–15.5)
WBC: 9.8 10*3/uL (ref 4.0–10.5)

## 2016-03-06 LAB — HEPATIC FUNCTION PANEL
ALT: 8 U/L — AB (ref 14–54)
AST: 18 U/L (ref 15–41)
Albumin: 3.9 g/dL (ref 3.5–5.0)
Alkaline Phosphatase: 67 U/L (ref 38–126)
BILIRUBIN INDIRECT: 0.4 mg/dL (ref 0.3–0.9)
Bilirubin, Direct: 0.1 mg/dL (ref 0.1–0.5)
TOTAL PROTEIN: 7.8 g/dL (ref 6.5–8.1)
Total Bilirubin: 0.5 mg/dL (ref 0.3–1.2)

## 2016-03-06 LAB — BASIC METABOLIC PANEL
ANION GAP: 10 (ref 5–15)
BUN: 10 mg/dL (ref 6–20)
CALCIUM: 9.9 mg/dL (ref 8.9–10.3)
CO2: 25 mmol/L (ref 22–32)
Chloride: 106 mmol/L (ref 101–111)
Creatinine, Ser: 0.73 mg/dL (ref 0.44–1.00)
GLUCOSE: 98 mg/dL (ref 65–99)
Potassium: 3.6 mmol/L (ref 3.5–5.1)
SODIUM: 141 mmol/L (ref 135–145)

## 2016-03-06 LAB — SEDIMENTATION RATE: Sed Rate: 26 mm/hr — ABNORMAL HIGH (ref 0–22)

## 2016-03-06 LAB — C-REACTIVE PROTEIN: CRP: 5 mg/dL — AB (ref <1.0)

## 2016-03-06 MED ORDER — METHOCARBAMOL 500 MG PO TABS
750.0000 mg | ORAL_TABLET | Freq: Once | ORAL | Status: AC
  Administered 2016-03-06: 750 mg via ORAL
  Filled 2016-03-06: qty 2

## 2016-03-06 MED ORDER — METHOCARBAMOL 500 MG PO TABS: 500.0000 mg | ORAL_TABLET | Freq: Two times a day (BID) | ORAL | 0 refills | Status: DC

## 2016-03-06 MED ORDER — TRAMADOL HCL 50 MG PO TABS: 50.0000 mg | ORAL_TABLET | Freq: Once | ORAL | Status: DC
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
## 2016-03-06 NOTE — ED Notes (Signed)
Pt state she drove herself here but would be able to get a ride at discharge if she wanted to take Robaxin while here.

## 2016-08-04 ENCOUNTER — Emergency Department (HOSPITAL_COMMUNITY): Payer: BC Managed Care – PPO

## 2016-08-04 ENCOUNTER — Encounter (HOSPITAL_COMMUNITY): Payer: Self-pay | Admitting: Emergency Medicine

## 2016-08-04 ENCOUNTER — Emergency Department (HOSPITAL_COMMUNITY)
Admission: EM | Admit: 2016-08-04 | Discharge: 2016-08-04 | Disposition: A | Discharge status: Home / Self Care | Payer: BC Managed Care – PPO | Attending: Emergency Medicine | Admitting: Emergency Medicine

## 2016-08-04 DIAGNOSIS — Z7982 Long term (current) use of aspirin: Secondary | ICD-10-CM | POA: Diagnosis not present

## 2016-08-04 DIAGNOSIS — R200 Anesthesia of skin: Secondary | ICD-10-CM | POA: Diagnosis not present

## 2016-08-04 DIAGNOSIS — M5412 Radiculopathy, cervical region: Secondary | ICD-10-CM | POA: Diagnosis not present

## 2016-08-04 DIAGNOSIS — Z7984 Long term (current) use of oral hypoglycemic drugs: Secondary | ICD-10-CM | POA: Insufficient documentation

## 2016-08-04 DIAGNOSIS — I10 Essential (primary) hypertension: Secondary | ICD-10-CM | POA: Insufficient documentation

## 2016-08-04 DIAGNOSIS — E119 Type 2 diabetes mellitus without complications: Secondary | ICD-10-CM | POA: Insufficient documentation

## 2016-08-04 DIAGNOSIS — M542 Cervicalgia: Secondary | ICD-10-CM | POA: Diagnosis present

## 2016-08-04 DIAGNOSIS — Z79899 Other long term (current) drug therapy: Secondary | ICD-10-CM | POA: Diagnosis not present

## 2016-08-04 LAB — BASIC METABOLIC PANEL
Anion gap: 10 (ref 5–15)
BUN: 12 mg/dL (ref 6–20)
CHLORIDE: 101 mmol/L (ref 101–111)
CO2: 27 mmol/L (ref 22–32)
CREATININE: 0.68 mg/dL (ref 0.44–1.00)
Calcium: 9.2 mg/dL (ref 8.9–10.3)
GFR calc Af Amer: >60 mL/min (ref >60)
GFR calc non Af Amer: >60 mL/min (ref >60)
GLUCOSE: 104 mg/dL — AB (ref 65–99)
Potassium: 3.6 mmol/L (ref 3.5–5.1)
Sodium: 138 mmol/L (ref 135–145)

## 2016-08-04 LAB — CBC WITH DIFFERENTIAL/PLATELET
Basophils Absolute: 0 10*3/uL (ref 0.0–0.1)
Basophils Relative: 0 %
EOS ABS: 0 10*3/uL (ref 0.0–0.7)
Eosinophils Relative: 0 %
HEMATOCRIT: 38.2 % (ref 36.0–46.0)
Hemoglobin: 12.1 g/dL (ref 12.0–15.0)
LYMPHS ABS: 1.3 10*3/uL (ref 0.7–4.0)
Lymphocytes Relative: 11 %
MCH: 26.9 pg (ref 26.0–34.0)
MCHC: 31.7 g/dL (ref 30.0–36.0)
MCV: 84.9 fL (ref 78.0–100.0)
MONO ABS: 0.5 10*3/uL (ref 0.1–1.0)
MONOS PCT: 4 %
NEUTROS PCT: 85 %
Neutro Abs: 9.9 10*3/uL — ABNORMAL HIGH (ref 1.7–7.7)
PLATELETS: 183 10*3/uL (ref 150–400)
RBC: 4.5 MIL/uL (ref 3.87–5.11)
RDW: 15 % (ref 11.5–15.5)
WBC: 11.7 10*3/uL — ABNORMAL HIGH (ref 4.0–10.5)

## 2016-08-04 LAB — TROPONIN I: Troponin I: <0.03 ng/mL (ref <0.03)

## 2016-08-04 MED ORDER — TRAMADOL HCL 50 MG PO TABS: 50.0000 mg | ORAL_TABLET | Freq: Four times a day (QID) | ORAL | 0 refills | Status: DC | PRN

## 2016-08-04 MED ORDER — PREDNISONE 20 MG PO TABS: 40.0000 mg | ORAL_TABLET | Freq: Every day | ORAL | 0 refills | Status: DC

## 2016-08-04 MED ORDER — HYDROCODONE-ACETAMINOPHEN 5-325 MG PO TABS
1.0000 | ORAL_TABLET | Freq: Once | ORAL | Status: AC
  Administered 2016-08-04: 1 via ORAL
  Filled 2016-08-04: qty 1

## 2016-08-04 NOTE — Discharge Instructions (Signed)
Alternate ice and heat to your neck. Call the neurology provider listed to arrange a follow-up appt.  Monitor your blood sugar closely while taking the prednisone and stop if your sugars become elevated.
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
## 2016-08-04 NOTE — ED Notes (Signed)
Pt voices understanding of discharge instructions and follow up information. NAD. A/o x4. Ambulatory at departure.

## 2016-08-04 NOTE — ED Notes (Signed)
Pt transported to CT ?
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
## 2018-07-25 NOTE — Progress Notes (Signed)
Subjective:   Christy Higgins, female    DOB: [birth year], 68 y.o.   MRN: [id number]  [facility]:  Chief Complaint  Patient presents with  . Hypertension  . Follow-up    6 mth   This visit type was conducted due to national recommendations for restrictions regarding the COVID-19 Pandemic (e.g. social distancing).  This format is felt to be most appropriate for this patient at this time.  All issues noted in this document were discussed and addressed.  No physical exam was performed (except for noted visual exam findings with Telehealth visits).  The patient has consented to conduct a Telehealth visit and understands insurance will be billed.   I discussed the limitations of evaluation and management by telemedicine and the availability of in person appointments. The patient expressed understanding and agreed to proceed.  Virtual Visit via Video Note is as below  I connected with Christy Higgins, on 07/26/2018 at 1430 by telephone and verified that I am speaking with the correct person using two identifiers. Unable to do video visit as patient did not have the equipment for this available to her.    I have discussed with her regarding the safety during COVID Pandemic and steps and precautions including social distancing with the patient.    HPI: Christy Higgins  is a 68 y.o. female  with diabetes, hypertension, osteoarthritis, found to have hyperdynamic LV on echocardiogram in September 2019 that was felt to be source of systolic murmur.    Tachycardia and blood pressure have improved with diltiazem. She reports that blood pressure consistently runs in the 353'I systolic. Reports being out of diltiazem for the last 1 month. She now presents for follow up. No new complaints today.  She reports diabetes is well controlled with metformin. Denies any smoking history. Her husband has had recurrence of prostate cancer, but has completed radiation therapy and is now  doing much better. States that she is currently out of work for the next 1 month at least.   Past Medical History:  Diagnosis Date  . Diabetes mellitus   . High cholesterol   . Hypertension     Past Surgical History:  Procedure Laterality Date  . CESAREAN SECTION    . TONSILLECTOMY      Family History  Problem Relation Age of Onset  . Hypertension Mother   . Diabetes Father   . Hypertension Father   . Hypertension Sister   . Diabetes Sister   . Hypertension Sister   . Diabetes Sister   . Hypertension Sister   . Diabetes Sister     Social History   Socioeconomic History  . Marital status: Married    Spouse name: Not on file  . Number of children: 1  . Years of education: Not on file  . Highest education level: Not on file  Occupational History  . Not on file  Social Needs  . Financial resource strain: Not on file  . Food insecurity:    Worry: Not on file    Inability: Not on file  . Transportation needs:    Medical: Not on file    Non-medical: Not on file  Tobacco Use  . Smoking status: Never Smoker  . Smokeless tobacco: Never Used  Substance and Sexual Activity  . Alcohol use: No  . Drug use: No  . Sexual activity: Not on file  Lifestyle  . Physical activity:    Days per week: Not on file  Minutes per session: Not on file  . Stress: Not on file  Relationships  . Social connections:    Talks on phone: Not on file    Gets together: Not on file    Attends religious service: Not on file    Active member of club or organization: Not on file    Attends meetings of clubs or organizations: Not on file    Relationship status: Not on file  . Intimate partner violence:    Fear of current or ex partner: Not on file    Emotionally abused: Not on file    Physically abused: Not on file    Forced sexual activity: Not on file  Other Topics Concern  . Not on file  Social History Narrative  . Not on file    Current Meds  Medication Sig  .  acetaminophen-codeine (TYLENOL #3) 300-30 MG per tablet Take 1-2 tablets by mouth every 6 (six) hours as needed for moderate pain.   Marland Kitchen losartan (COZAAR) 50 MG tablet Take 50 mg by mouth daily.   . metFORMIN (GLUCOPHAGE) 500 MG tablet Take 500 mg by mouth daily.    . predniSONE (DELTASONE) 20 MG tablet Take 2 tablets (40 mg total) by mouth daily.  . traMADol (ULTRAM) 50 MG tablet Take 1 tablet (50 mg total) by mouth every 6 (six) hours as needed.     Review of Systems  Constitution: Negative for decreased appetite, malaise/fatigue, weight gain and weight loss.  Eyes: Negative for visual disturbance.  Cardiovascular: Negative for chest pain, claudication, dyspnea on exertion, leg swelling, orthopnea, palpitations and syncope.  Respiratory: Negative for hemoptysis and wheezing.   Endocrine: Negative for cold intolerance and heat intolerance.  Hematologic/Lymphatic: Does not bruise/bleed easily.  Skin: Negative for nail changes.  Musculoskeletal: Negative for muscle weakness and myalgias.  Gastrointestinal: Negative for abdominal pain, change in bowel habit, nausea and vomiting.  Neurological: Negative for difficulty with concentration, dizziness, focal weakness and headaches.  Psychiatric/Behavioral: Negative for altered mental status and suicidal ideas.  All other systems reviewed and are negative.      Objective:     Blood pressure 136/75, height 5' (1.524 m), weight 119 lb (54 kg).  Cardiac studies:  Echocardiogram 12/11/2017: Left ventricle cavity is normal in size. Visual EF is 60-65%. Mild intraventricular pressure gradient suspected due to dynamic LV systolic function. Doppler evidence of grade I (impaired) diastolic dysfunction, elevated LAP. Left ventricle regional wall motion findings: No wall motion abnormalities. Trace tricuspid regurgitation. Trace pulmonic regurgitation.   Recent Labs: 10/18/2017: BNP 10.9. Hemoglobin A1c 6%. Creatinine 0.68, EGFR 91/105, sodium 147,  potassium 5.5, calcium 2.4, CMP otherwise normal. CBC normal.          Assessment & Recommendations:  1. Essential hypertension Blood pressure has been stable in the 099'I systolic. Continue to feel that she would benefit from diltiazem for her hyperdynamic LV and she is also slightly hypertensive. I have refilled her previous prescription as she tolerated this well. Encouraged continued diet modifications to help with BP control and also regular exercise.   2. Tachycardia Likely related to stress and anxiety as it seems to have resolved at this time. Will continue with diltiazem as stated above.    Plan: I will see her back in the office in 6 months for follow up on hypertension and hyperdynamic LV.   Jeri Lager, MSN, APRN, FNP-C [facility], [place] Office: [phone] Fax: [phone]

## 2018-07-26 ENCOUNTER — Other Ambulatory Visit: Payer: Self-pay

## 2018-07-26 ENCOUNTER — Encounter: Payer: Self-pay | Admitting: Cardiology

## 2018-07-26 ENCOUNTER — Ambulatory Visit: Payer: BC Managed Care – PPO | Admitting: Cardiology

## 2018-07-26 VITALS — BP 136/75 | Ht 60.0 in | Wt 119.0 lb

## 2018-07-26 DIAGNOSIS — R000 Tachycardia, unspecified: Secondary | ICD-10-CM | POA: Diagnosis not present

## 2018-07-26 DIAGNOSIS — I10 Essential (primary) hypertension: Secondary | ICD-10-CM | POA: Diagnosis not present

## 2018-07-26 MED ORDER — DILTIAZEM HCL ER COATED BEADS 180 MG PO CP24: 180.0000 mg | ORAL_CAPSULE | Freq: Every day | ORAL | 3 refills | Status: DC

## 2018-07-27 ENCOUNTER — Encounter: Payer: Self-pay | Admitting: Cardiology

## 2019-01-24 ENCOUNTER — Ambulatory Visit: Payer: BC Managed Care – PPO | Admitting: Cardiology

## 2019-01-24 ENCOUNTER — Other Ambulatory Visit: Payer: Self-pay

## 2019-01-24 ENCOUNTER — Encounter: Payer: Self-pay | Admitting: Cardiology

## 2019-01-24 VITALS — BP 138/80 | HR 89 | Temp 96.9°F | Ht 60.0 in | Wt 124.8 lb

## 2019-01-24 DIAGNOSIS — I10 Essential (primary) hypertension: Secondary | ICD-10-CM

## 2019-01-24 DIAGNOSIS — R000 Tachycardia, unspecified: Secondary | ICD-10-CM | POA: Diagnosis not present

## 2019-01-24 NOTE — Progress Notes (Signed)
Primary Physician:  Shanon Rosser, PA-C   Patient ID: Christy Higgins, female    DOB: 1950-12-24, 68 y.o.   MRN: [id number]  Subjective:    Chief Complaint  Patient presents with  . Hypertension    HPI: Jennifer Payes  is a 68 y.o. female  with diabetes, hypertension, osteoarthritis, found to have hyperdynamic LV on echocardiogram in September 2019 that was felt to be source of systolic murmur.    Tachycardia and blood pressure have improved with diltiazem. She is here for 6 month follow up. No new complaints today. Reports having issues with neck and right shoulder pain that improves with OTC analgesics and Ibuprofen.   She reports diabetes is well controlled with metformin. Denies any smoking history. Her husband has had recurrence of prostate cancer, but has completed radiation therapy and is now doing much better. States that she is currently out of work for the next 1 month at least.  Past Medical History:  Diagnosis Date  . Diabetes mellitus   . High cholesterol   . Hypertension     Past Surgical History:  Procedure Laterality Date  . CESAREAN SECTION    . TONSILLECTOMY      Social History   Socioeconomic History  . Marital status: Married    Spouse name: Not on file  . Number of children: 1  . Years of education: Not on file  . Highest education level: Not on file  Occupational History  . Not on file  Social Needs  . Financial resource strain: Not on file  . Food insecurity    Worry: Not on file    Inability: Not on file  . Transportation needs    Medical: Not on file    Non-medical: Not on file  Tobacco Use  . Smoking status: Never Smoker  . Smokeless tobacco: Never Used  Substance and Sexual Activity  . Alcohol use: No  . Drug use: No  . Sexual activity: Not on file  Lifestyle  . Physical activity    Days per week: Not on file    Minutes per session: Not on file  . Stress: Not on file  Relationships  . Social Herbalist on phone: Not  on file    Gets together: Not on file    Attends religious service: Not on file    Active member of club or organization: Not on file    Attends meetings of clubs or organizations: Not on file    Relationship status: Not on file  . Intimate partner violence    Fear of current or ex partner: Not on file    Emotionally abused: Not on file    Physically abused: Not on file    Forced sexual activity: Not on file  Other Topics Concern  . Not on file  Social History Narrative  . Not on file    Review of Systems  Constitution: Negative for decreased appetite, malaise/fatigue, weight gain and weight loss.  Eyes: Negative for visual disturbance.  Cardiovascular: Negative for chest pain, claudication, dyspnea on exertion, leg swelling, orthopnea, palpitations and syncope.  Respiratory: Negative for hemoptysis and wheezing.   Endocrine: Negative for cold intolerance and heat intolerance.  Hematologic/Lymphatic: Does not bruise/bleed easily.  Skin: Negative for nail changes.  Musculoskeletal: Positive for joint pain (right shoulder) and neck pain. Negative for muscle weakness and myalgias.  Gastrointestinal: Negative for abdominal pain, change in bowel habit, nausea and vomiting.  Neurological: Negative for difficulty with  concentration, dizziness, focal weakness and headaches.  Psychiatric/Behavioral: Negative for altered mental status and suicidal ideas.  All other systems reviewed and are negative.     Objective:  Blood pressure 138/80, pulse 89, temperature (!) 96.9 F (36.1 C), height 5' (1.524 m), weight 124 lb 12.8 oz (56.6 kg), SpO2 96 %. Body mass index is 24.37 kg/m.    Physical Exam  Constitutional: She is oriented to person, place, and time. Vital signs are normal. She appears well-developed and well-nourished.  HENT:  Head: Normocephalic and atraumatic.  Neck: Normal range of motion.  Cardiovascular: Normal rate, regular rhythm, normal heart sounds and intact distal pulses.   Pulmonary/Chest: Effort normal and breath sounds normal. No accessory muscle usage. No respiratory distress.  Abdominal: Soft. Bowel sounds are normal.  Musculoskeletal: Normal range of motion.  Neurological: She is alert and oriented to person, place, and time.  Skin: Skin is warm and dry.  Vitals reviewed.  Radiology: No results found.  Laboratory examination:    Recent Labs: 10/18/2017: BNP 10.9. Hemoglobin A1c 6%. Creatinine 0.68, EGFR 91/105, sodium 147, potassium 5.5, calcium 2.4, CMP otherwise normal. CBC normal.  CMP Latest Ref Rng & Units 08/04/2016 03/06/2016 04/14/2011  Glucose 65 - 99 mg/dL 104(H) 98 103(H)  BUN 6 - 20 mg/dL '12 10 11  '$ Creatinine 0.44 - 1.00 mg/dL 0.68 0.73 0.70  Sodium 135 - 145 mmol/L 138 141 143  Potassium 3.5 - 5.1 mmol/L 3.6 3.6 3.9  Chloride 101 - 111 mmol/L 101 106 107  CO2 22 - 32 mmol/L 27 25 -  Calcium 8.9 - 10.3 mg/dL 9.2 9.9 -  Total Protein 6.5 - 8.1 g/dL - 7.8 -  Total Bilirubin 0.3 - 1.2 mg/dL - 0.5 -  Alkaline Phos 38 - 126 U/L - 67 -  AST 15 - 41 U/L - 18 -  ALT 14 - 54 U/L - 8(L) -   CBC Latest Ref Rng & Units 08/04/2016 03/06/2016 04/14/2011  WBC 4.0 - 10.5 K/uL 11.7(H) 9.8 -  Hemoglobin 12.0 - 15.0 g/dL 12.1 12.2 13.6  Hematocrit 36.0 - 46.0 % 38.2 38.7 40.0  Platelets 150 - 400 K/uL 183 208 -   Lipid Panel  No results found for: CHOL, TRIG, HDL, CHOLHDL, VLDL, LDLCALC, LDLDIRECT HEMOGLOBIN A1C No results found for: HGBA1C, MPG TSH No results for input(s): TSH in the last 8760 hours.  PRN Meds:. Medications Discontinued During This Encounter  Medication Reason  . predniSONE (DELTASONE) 20 MG tablet Completed Course  . traMADol (ULTRAM) 50 MG tablet Completed Course  . metFORMIN (GLUCOPHAGE) 500 MG tablet Dose change  . losartan (COZAAR) 50 MG tablet Dose change   Current Meds  Medication Sig  . acetaminophen-codeine (TYLENOL #3) 300-30 MG per tablet Take 1-2 tablets by mouth every 6 (six) hours as needed for moderate  pain.   Marland Kitchen aspirin EC 81 MG tablet Take by mouth daily.  Marland Kitchen diltiazem (CARDIZEM CD) 180 MG 24 hr capsule Take 1 capsule (180 mg total) by mouth daily.  Marland Kitchen losartan (COZAAR) 100 MG tablet daily.  . metFORMIN (GLUCOPHAGE-XR) 750 MG 24 hr tablet TK 1 T PO WITH THE EVE MEAL UTD  . Vitamin D, Ergocalciferol, (DRISDOL) 1.25 MG (50000 UT) CAPS capsule once a week.  . [DISCONTINUED] losartan (COZAAR) 50 MG tablet Take 50 mg by mouth daily.   . [DISCONTINUED] metFORMIN (GLUCOPHAGE) 500 MG tablet Take 750 mg by mouth daily.     Cardiac Studies:   Echocardiogram 12/11/2017: Left ventricle cavity  is normal in size. Visual EF is 60-65%. Mild intraventricular pressure gradient suspected due to dynamic LV systolic function. Doppler evidence of grade I (impaired) diastolic dysfunction, elevated LAP. Left ventricle regional wall motion findings: No wall motion abnormalities. Trace tricuspid regurgitation. Trace pulmonic regurgitation.  Assessment:   Tachycardia - Plan: EKG 12-Lead  Essential hypertension  EKG 01/24/2019: Normal sinus rhythm at 89 bpm, left atrial enlargement, LVH. No evidence of ischemia.   Recommendations:   Patient is here for 7-monthoffice visit and follow-up for tachycardia and hypertension.  Heart rate remains well controlled with diltiazem.  Blood pressure was initially elevated in our office, but on recheck had significantly improved.  We will continue with her current medications.  Except for neck and shoulder pain, she is asymptomatic today.  Encouraged her to follow-up with her PCP regarding arthritic pain.  I will plan to see her back in 1 year or sooner if problems.  AMiquel Dunn MSN, APRN, FNP-C [facility]. P[place]Office: [phone]: [phone]

## 2019-07-07 ENCOUNTER — Ambulatory Visit: Payer: BC Managed Care – PPO | Attending: Internal Medicine

## 2019-07-07 ENCOUNTER — Ambulatory Visit: Payer: BC Managed Care – PPO

## 2019-07-07 DIAGNOSIS — Z23 Encounter for immunization: Secondary | ICD-10-CM

## 2019-07-07 NOTE — Progress Notes (Signed)
   Covid-19 Vaccination Clinic  Name:  Christy Higgins    MRN: [id number] DOB: 04/16/1951  07/07/2019  [name] was observed post Covid-19 immunization for 30 minutes based on pre-vaccination screening without incident. She was provided with Vaccine Information Sheet and instruction to access the V-Safe system.   [name] was instructed to call 911 with any severe reactions post vaccine: Marland Kitchen Difficulty breathing  . Swelling of face and throat  . A fast heartbeat  . A bad rash all over body  . Dizziness and weakness   Immunizations Administered    Name Date Dose VIS Date Route   Pfizer COVID-19 Vaccine 07/07/2019  8:13 AM 0.3 mL 04/01/2019 Intramuscular   Manufacturer: [employer], [employer]   Lot: [id number]   NDC: [address]-7

## 2019-08-01 ENCOUNTER — Ambulatory Visit: Payer: BC Managed Care – PPO | Attending: Internal Medicine

## 2019-08-01 DIAGNOSIS — Z23 Encounter for immunization: Secondary | ICD-10-CM

## 2019-08-01 NOTE — Progress Notes (Signed)
   Covid-19 Vaccination Clinic  Name:  Christy Higgins    MRN: [id number] DOB: Dec 15, 1950  08/01/2019  Christy Higgins was observed post Covid-19 immunization for 30 minutes based on pre-vaccination screening without incident. She was provided with Vaccine Information Sheet and instruction to access the V-Safe system.   Christy Higgins was instructed to call 911 with any severe reactions post vaccine: Marland Kitchen Difficulty breathing  . Swelling of face and throat  . A fast heartbeat  . A bad rash all over body  . Dizziness and weakness   Immunizations Administered    Name Date Dose VIS Date Route   Pfizer COVID-19 Vaccine 08/01/2019  8:09 AM 0.3 mL 04/01/2019 Intramuscular   Manufacturer: [employer], [employer]   Lot: [id number]   NDC: [address]-8

## 2020-01-24 ENCOUNTER — Ambulatory Visit: Payer: BC Managed Care – PPO | Admitting: Cardiology

## 2020-06-14 ENCOUNTER — Other Ambulatory Visit: Payer: Self-pay | Admitting: Nurse Practitioner

## 2020-06-14 DIAGNOSIS — R5381 Other malaise: Secondary | ICD-10-CM

## 2020-06-14 DIAGNOSIS — Z1231 Encounter for screening mammogram for malignant neoplasm of breast: Secondary | ICD-10-CM

## 2021-02-03 ENCOUNTER — Emergency Department (HOSPITAL_COMMUNITY)
Admission: EM | Admit: 2021-02-03 | Discharge: 2021-02-04 | Disposition: A | Discharge status: Home / Self Care | Payer: BC Managed Care – PPO | Source: Home / Self Care | Attending: Emergency Medicine | Admitting: Emergency Medicine

## 2021-02-03 ENCOUNTER — Emergency Department (HOSPITAL_COMMUNITY): Payer: BC Managed Care – PPO

## 2021-02-03 ENCOUNTER — Other Ambulatory Visit: Payer: Self-pay

## 2021-02-03 DIAGNOSIS — I10 Essential (primary) hypertension: Secondary | ICD-10-CM | POA: Insufficient documentation

## 2021-02-03 DIAGNOSIS — Z7982 Long term (current) use of aspirin: Secondary | ICD-10-CM | POA: Insufficient documentation

## 2021-02-03 DIAGNOSIS — E119 Type 2 diabetes mellitus without complications: Secondary | ICD-10-CM | POA: Insufficient documentation

## 2021-02-03 DIAGNOSIS — M436 Torticollis: Secondary | ICD-10-CM | POA: Insufficient documentation

## 2021-02-03 DIAGNOSIS — Z79899 Other long term (current) drug therapy: Secondary | ICD-10-CM | POA: Insufficient documentation

## 2021-02-03 DIAGNOSIS — Z7984 Long term (current) use of oral hypoglycemic drugs: Secondary | ICD-10-CM | POA: Insufficient documentation

## 2021-02-03 DIAGNOSIS — L0390 Cellulitis, unspecified: Secondary | ICD-10-CM

## 2021-02-03 DIAGNOSIS — M009 Pyogenic arthritis, unspecified: Secondary | ICD-10-CM | POA: Diagnosis not present

## 2021-02-03 DIAGNOSIS — L03113 Cellulitis of right upper limb: Secondary | ICD-10-CM | POA: Insufficient documentation

## 2021-02-03 DIAGNOSIS — L03119 Cellulitis of unspecified part of limb: Secondary | ICD-10-CM

## 2021-02-03 DIAGNOSIS — A419 Sepsis, unspecified organism: Secondary | ICD-10-CM | POA: Diagnosis not present

## 2021-02-03 DIAGNOSIS — Z20822 Contact with and (suspected) exposure to covid-19: Secondary | ICD-10-CM | POA: Insufficient documentation

## 2021-02-03 LAB — COMPREHENSIVE METABOLIC PANEL
ALT: 37 U/L (ref 0–44)
AST: 41 U/L (ref 15–41)
Albumin: 2.9 g/dL — ABNORMAL LOW (ref 3.5–5.0)
Alkaline Phosphatase: 90 U/L (ref 38–126)
Anion gap: 11 (ref 5–15)
BUN: 17 mg/dL (ref 8–23)
CO2: 24 mmol/L (ref 22–32)
Calcium: 9.1 mg/dL (ref 8.9–10.3)
Chloride: 101 mmol/L (ref 98–111)
Creatinine, Ser: 0.81 mg/dL (ref 0.44–1.00)
GFR, Estimated: >60 mL/min (ref >60)
Glucose, Bld: 101 mg/dL — ABNORMAL HIGH (ref 70–99)
Potassium: 3.9 mmol/L (ref 3.5–5.1)
Sodium: 136 mmol/L (ref 135–145)
Total Bilirubin: 1.1 mg/dL (ref 0.3–1.2)
Total Protein: 7.7 g/dL (ref 6.5–8.1)

## 2021-02-03 LAB — CBC WITH DIFFERENTIAL/PLATELET
Abs Immature Granulocytes: 0.12 10*3/uL — ABNORMAL HIGH (ref 0.00–0.07)
Basophils Absolute: 0.1 10*3/uL (ref 0.0–0.1)
Basophils Relative: 0 %
Eosinophils Absolute: 0 10*3/uL (ref 0.0–0.5)
Eosinophils Relative: 0 %
HCT: 34.4 % — ABNORMAL LOW (ref 36.0–46.0)
Hemoglobin: 10.9 g/dL — ABNORMAL LOW (ref 12.0–15.0)
Immature Granulocytes: 1 %
Lymphocytes Relative: 7 %
Lymphs Abs: 1.4 10*3/uL (ref 0.7–4.0)
MCH: 27.5 pg (ref 26.0–34.0)
MCHC: 31.7 g/dL (ref 30.0–36.0)
MCV: 86.9 fL (ref 80.0–100.0)
Monocytes Absolute: 1.6 10*3/uL — ABNORMAL HIGH (ref 0.1–1.0)
Monocytes Relative: 9 %
Neutro Abs: 15.3 10*3/uL — ABNORMAL HIGH (ref 1.7–7.7)
Neutrophils Relative %: 83 %
Platelets: 219 10*3/uL (ref 150–400)
RBC: 3.96 MIL/uL (ref 3.87–5.11)
RDW: 14.7 % (ref 11.5–15.5)
WBC: 18.4 10*3/uL — ABNORMAL HIGH (ref 4.0–10.5)
nRBC: 0 % (ref 0.0–0.2)

## 2021-02-03 LAB — LACTIC ACID, PLASMA: Lactic Acid, Venous: 1.3 mmol/L (ref 0.5–1.9)

## 2021-02-03 MED ORDER — ACETAMINOPHEN 500 MG PO TABS
1000.0000 mg | ORAL_TABLET | Freq: Once | ORAL | Status: AC
  Administered 2021-02-03: 1000 mg via ORAL
  Filled 2021-02-03: qty 2

## 2021-02-03 NOTE — ED Provider Notes (Signed)
[facility] EMERGENCY DEPARTMENT Provider Note  CSN: [id number] Arrival date & time: 02/03/21 1541  Chief Complaint(s) Neck Pain and Hand Problem  HPI Christy Higgins is a 70 y.o. female here for 1 week of right wrist pain.  Gradual onset.  Aching/burning sensation.  Worse with movement and palpation.  Denies any trauma.  Reports intermittent fevers.  Woke up today with right-sided neck pain (similar to torticollis from the past).  No headache, nausea, vomiting, cough, congestion, chest pain, abdominal pain, urinary symptoms.  Patient denies any recent instrumentation. She has a history of diabetes but is on metformin.  No insulin use.  Denies any IV drug use.  No cuts or wounds.  No recent infections. No prior history of gout.  The history is provided by the patient.   Past Medical History Past Medical History:  Diagnosis Date   Diabetes mellitus    High cholesterol    Hypertension    Patient Active Problem List   Diagnosis Date Noted   Cellulitis 02/04/2021   Home Medication(s) Prior to Admission medications   Medication Sig Start Date End Date Taking? Authorizing Provider  acetaminophen-codeine (TYLENOL #3) 300-30 MG per tablet Take 2 tablets by mouth 2 (two) times daily as needed for moderate pain. 03/31/13  Yes [provider]  aspirin EC 81 MG tablet Take 81 mg by mouth daily.   Yes [provider]  HYDROcodone-acetaminophen (NORCO/VICODIN) 5-325 MG tablet Take 1 tablet by mouth every 8 (eight) hours as needed for up to 5 days for severe pain (That is not improved by your scheduled acetaminophen regimen). Please do not exceed 4000 mg of acetaminophen (Tylenol) a 24-hour period. Please note that he may be prescribed additional medicine that contains acetaminophen. 02/04/21 02/09/21 Yes Vanesa Renier, Amadeo Garnet, MD  losartan (COZAAR) 100 MG tablet Take 100 mg by mouth daily. 12/14/18  Yes [provider]  meloxicam (MOBIC) 7.5 MG tablet  Take 7.5 mg by mouth daily. 01/31/21  Yes [provider]  metFORMIN (GLUCOPHAGE-XR) 750 MG 24 hr tablet Take 750 mg by mouth every evening. 12/14/18  Yes [provider]  sulfamethoxazole-trimethoprim (BACTRIM DS) 800-160 MG tablet Take 1 tablet by mouth 2 (two) times daily for 14 days. 02/04/21 02/18/21 Yes Charlee Squibb, Amadeo Garnet, MD  traMADol (ULTRAM) 50 MG tablet Take 50 mg by mouth every 6 (six) hours as needed. 01/31/21  Yes [provider]  Vitamin D, Ergocalciferol, (DRISDOL) 1.25 MG (50000 UT) CAPS capsule Take 50,000 Units by mouth every Friday. 10/13/18  Yes [provider]  diltiazem (CARDIZEM CD) 180 MG 24 hr capsule Take 1 capsule (180 mg total) by mouth daily. Patient not taking: Reported on 02/04/2021 07/26/18 01/24/19  Toniann Fail, NP  Past Surgical History Past Surgical History:  Procedure Laterality Date   CESAREAN SECTION     TONSILLECTOMY     Family History Family History  Problem Relation Age of Onset   Hypertension Mother    Diabetes Father    Hypertension Father    Hypertension Sister    Diabetes Sister    Hypertension Sister    Diabetes Sister    Hypertension Sister    Diabetes Sister     Social History Social History   Tobacco Use   Smoking status: Never   Smokeless tobacco: Never  Substance Use Topics   Alcohol use: No   Drug use: No   Allergies Patient has no known allergies.  Review of Systems Review of Systems All other systems are reviewed and are negative for acute change except as noted in the HPI  Physical Exam Vital Signs  I have reviewed the triage vital signs BP (!) 111/49   Pulse 66   Temp 97.9 F (36.6 C)   Resp 16   SpO2 99%   Physical Exam Vitals reviewed.  Constitutional:      General: She is not in acute distress.    Appearance: She is  well-developed. She is not diaphoretic.  HENT:     Head: Normocephalic and atraumatic.     Nose: Nose normal.  Eyes:     General: No scleral icterus.       Right eye: No discharge.        Left eye: No discharge.     Conjunctiva/sclera: Conjunctivae normal.     Pupils: Pupils are equal, round, and reactive to light.  Cardiovascular:     Rate and Rhythm: Normal rate and regular rhythm.     Heart sounds: No murmur heard.   No friction rub. No gallop.  Pulmonary:     Effort: Pulmonary effort is normal. No respiratory distress.     Breath sounds: Normal breath sounds. No stridor. No rales.  Abdominal:     General: There is no distension.     Palpations: Abdomen is soft.     Tenderness: There is no abdominal tenderness.  Musculoskeletal:     Right wrist: Swelling and tenderness present. Decreased range of motion. Normal pulse.     Left wrist: Normal pulse.       Arms:     Cervical back: Normal range of motion and neck supple. Torticollis present. Muscular tenderness present.  Skin:    General: Skin is warm and dry.     Findings: No erythema or rash.  Neurological:     Mental Status: She is alert and oriented to person, place, and time.    ED Results and Treatments Labs (all labs ordered are listed, but only abnormal results are displayed) Labs Reviewed  CBC WITH DIFFERENTIAL/PLATELET - Abnormal; Notable for the following components:      Result Value   WBC 18.4 (*)    Hemoglobin 10.9 (*)    HCT 34.4 (*)    Neutro Abs 15.3 (*)    Monocytes Absolute 1.6 (*)    Abs Immature Granulocytes 0.12 (*)    All other components within normal limits  COMPREHENSIVE METABOLIC PANEL - Abnormal; Notable for the following components:   Glucose, Bld 101 (*)    Albumin 2.9 (*)    All other components within normal limits  SYNOVIAL CELL COUNT + DIFF, W/ CRYSTALS - Abnormal; Notable for the following components:   Color, Synovial STRAW (*)    Appearance-Synovial TURBID (*)  WBC, Synovial  1,245 (*)    Neutrophil, Synovial 88 (*)    Monocyte-Macrophage-Synovial Fluid 7 (*)    All other components within normal limits  RESP PANEL BY RT-PCR (FLU A&B, COVID) ARPGX2  LACTIC ACID, PLASMA  LACTIC ACID, PLASMA  URINALYSIS, ROUTINE W REFLEX MICROSCOPIC                                                                                                                         EKG  EKG Interpretation  Date/Time:    Ventricular Rate:    PR Interval:    QRS Duration:   QT Interval:    QTC Calculation:   R Axis:     Text Interpretation:         Radiology DG Wrist Complete Right  Result Date: 02/03/2021 CLINICAL DATA:  Pain, swelling, fever EXAM: RIGHT WRIST - COMPLETE 3+ VIEW COMPARISON:  01/13/2016 FINDINGS: Degenerative changes in the radiocarpal joint with joint space narrowing, subchondral sclerosis and cyst formation. Diffuse soft tissue swelling. No acute bony abnormality. Specifically, no fracture, subluxation, or dislocation. IMPRESSION: Degenerative changes in the radiocarpal joint. Diffuse soft tissue swelling. No acute bony abnormality. Electronically Signed   By: Charlett Nose M.D.   On: 02/03/2021 17:18   CT Soft Tissue Neck W Contrast  Result Date: 02/04/2021 CLINICAL DATA:  Lymphadenopathy and fever EXAM: CT NECK WITH CONTRAST TECHNIQUE: Multidetector CT imaging of the neck was performed using the standard protocol following the bolus administration of intravenous contrast. CONTRAST:  75mL OMNIPAQUE IOHEXOL 350 MG/ML SOLN COMPARISON:  None. FINDINGS: PHARYNX AND LARYNX: The nasopharynx, oropharynx and larynx are normal. Visible portions of the oral cavity, tongue base and floor of mouth are normal. Normal epiglottis, vallecula and pyriform sinuses. The larynx is normal. No retropharyngeal abscess, effusion or lymphadenopathy. SALIVARY GLANDS: Normal parotid, submandibular and sublingual glands. THYROID: Normal. LYMPH NODES: No enlarged or abnormal density lymph nodes.  VASCULAR: Mild atherosclerotic calcification in the right carotid bifurcation. LIMITED INTRACRANIAL: Normal. VISUALIZED ORBITS: Normal. MASTOIDS AND VISUALIZED PARANASAL SINUSES: No fluid levels or advanced mucosal thickening. No mastoid effusion. SKELETON: No bony spinal canal stenosis. No lytic or blastic lesions. Multilevel degenerative disc disease. UPPER CHEST: Clear. OTHER: None. IMPRESSION: 1. No cervical lymphadenopathy. 2. Normal pharynx and larynx. 3. Mild atherosclerotic calcification at the right carotid bifurcation. Electronically Signed   By: Deatra Robinson M.D.   On: 02/04/2021 02:14    Pertinent labs & imaging results that were available during my care of the patient were reviewed by me and considered in my medical decision making (see MDM for details).  Medications Ordered in ED Medications  acetaminophen (TYLENOL) tablet 1,000 mg (1,000 mg Oral Given 02/03/21 1652)  HYDROmorphone (DILAUDID) injection 0.5 mg (0.5 mg Intravenous Given 02/04/21 0105)  sodium chloride 0.9 % bolus 1,000 mL (0 mLs Intravenous Stopped 02/04/21 0259)  lidocaine (PF) (XYLOCAINE) 1 % injection 5 mL (5 mLs Intradermal Given 02/04/21 0137)  iohexol (OMNIPAQUE) 350 MG/ML injection 75 mL (  75 mLs Intravenous Contrast Given 02/04/21 0203)  ketorolac (TORADOL) 15 MG/ML injection 7.5 mg (7.5 mg Intravenous Given 02/04/21 0259)  HYDROmorphone (DILAUDID) injection 1 mg (1 mg Intravenous Given 02/04/21 0300)  sulfamethoxazole-trimethoprim (BACTRIM) 160 mg in dextrose 5 % 250 mL IVPB (0 mg Intravenous Stopped 02/04/21 0611)  HYDROcodone-acetaminophen (NORCO/VICODIN) 5-325 MG per tablet 1 tablet (1 tablet Oral Given 02/04/21 0459)                                                                                                                                     Procedures Ultrasound ED Soft Tissue  Date/Time: 02/04/2021 4:30 AM Performed by: Nira Conn, MD Authorized by: Nira Conn, MD    Procedure details:    Indications: limb pain and evaluate for cellulitis     Transverse view:  Visualized   Longitudinal view:  Visualized   Images: archived     Limitations:  Body habitus Location:    Location: upper extremity     Side:  Right Findings:     no abscess present    cellulitis present .Joint Aspiration/Arthrocentesis  Date/Time: 02/04/2021 4:31 AM Performed by: Nira Conn, MD Authorized by: Nira Conn, MD   Consent:    Consent obtained:  Verbal   Consent given by:  Patient   Risks discussed:  Infection and nerve damage   Alternatives discussed:  No treatment and delayed treatment Universal protocol:    Immediately prior to procedure, a time out was called: yes     Patient identity confirmed:  Verbally with patient and arm band Location:    Location:  Wrist   Wrist:  R radiocarpal Anesthesia:    Anesthesia method:  Local infiltration   Local anesthetic:  Lidocaine 1% w/o epi Procedure details:    Preparation: Patient was prepped and draped in usual sterile fashion     Needle gauge: 21g.   Approach:  Medial   Aspirate amount:  1cc   Aspirate characteristics:  Blood-tinged   Specimen collected: yes   Post-procedure details:    Dressing:  Adhesive bandage   Procedure completion:  Tolerated  (including critical care time)  Medical Decision Making / ED Course I have reviewed the nursing notes for this encounter and the patient's prior records (if available in EHR or on provided paperwork).  Christy Higgins was evaluated in Emergency Department on 02/04/2021 for the symptoms described in the history of present illness. She was evaluated in the context of the global COVID-19 pandemic, which necessitated consideration that the patient might be at risk for infection with the SARS-CoV-2 virus that causes COVID-19. Institutional protocols and algorithms that pertain to the evaluation of patients at risk for COVID-19 are in a state of rapid  change based on information released by regulatory bodies including the CDC and federal and state organizations. These policies and algorithms were followed during the patient's care in the ED.  Right hand pain and swelling. Overlying erythema. Decreased range of motion. Bedside ultrasound notable for cellulitic changes in the ulnar distribution of the right hand, wrist and distal forearm. Wrist arthrocentesis performed to rule out septic arthritis versus inflammatory arthritis such as gout. Synovial fluid without crystals. Not consistent with septic arthritis. Likely reactive from cellulitic changes. Patient febrile and with leukocytosis.  Rest of the labs not consistent with sepsis. Does not want to be admitted for IV antibiotics. Patient is not a candidate for Dalvance given location. Given IV Bactrim.  Neck pain. Reports similar to prior torticollis. No evidence of pharyngitis on exam. Given fever and leukocytosis CT scan obtained and negative for any deep tissue infection.  Pertinent labs & imaging results that were available during my care of the patient were reviewed by me and considered in my medical decision making:    Final Clinical Impression(s) / ED Diagnoses Final diagnoses:  Torticollis  Cellulitis of hand   The patient appears reasonably screened and/or stabilized for discharge and I doubt any other medical condition or other [facility] requiring further screening, evaluation, or treatment in the ED at this time prior to discharge. Safe for discharge with strict return precautions.  Disposition: Discharge  Condition: Good  I have discussed the results, Dx and Tx plan with the patient/family who expressed understanding and agree(s) with the plan. Discharge instructions discussed at length. The patient/family was given strict return precautions who verbalized understanding of the instructions. No further questions at time of discharge.    ED Discharge Orders           Ordered    Ambulatory referral to Infectious Disease       Comments: Cellulitis patient:  Received dalbavancin on 02/04/2021.   02/04/21 0406    HYDROcodone-acetaminophen (NORCO/VICODIN) 5-325 MG tablet  Every 8 hours PRN        02/04/21 0630    sulfamethoxazole-trimethoprim (BACTRIM DS) 800-160 MG tablet  2 times daily        02/04/21 0630            Follow Up: Lindaann Pascal, PA-C [address] [place] [id number] [phone]  Call  in 3-5 days, if symptoms do not improve or  worsen     This chart was dictated using voice recognition software.  Despite best efforts to proofread,  errors can occur which can change the documentation meaning.    Nira Conn, MD 02/04/21 [phone]

## 2021-02-03 NOTE — ED Triage Notes (Signed)
Pt. Stated, I have a neck pain and rt. Hand swelling and painful for a week. Went to another UC sent here.

## 2021-02-03 NOTE — ED Provider Notes (Signed)
Emergency Medicine Provider Triage Evaluation Note  Christy Higgins , a 71 y.o. female  was evaluated in triage.  Pt complains of gradual onset, constant, atraumatic, R wrist pain and swelling x 1 week. Also complains of intermittent fevers and right sided neck pain. She went to [facility] today and was sent here with concern for possible septic arthritis vs cellulitis. She did have a uric acid level drawn which was within normal limits. Pt denies hx IVDA. No recent tick exposures. No URI like symptoms.  Review of Systems  Positive: + R wrist pain/swelling, fevers, neck pain Negative: - URI symptoms, abd pain ,nausea, vomiting  Physical Exam  BP 130/79 (BP Location: Left Arm)   Pulse 75   Temp (!) 102.2 F (39 C) (Oral)   Resp 17   SpO2 99%  Gen:   Awake, no distress   Resp:  Normal effort  MSK:   Moves extremities without difficulty  Other:  R wrist swollen circumferentially with erythema and increased warmth to the touch. ROM limited s/2 swelling and pain. No tenderness to mid forearm, elbow, hand. Able to wiggle fingers without difficulty. Cap refill < 2 seconds to all fingers. + right lateral neck pain. No midline neck pain. No neck stiffness.   Medical Decision Making  Medically screening exam initiated at 4:48 PM.  Appropriate orders placed.  Christy Higgins was informed that the remainder of the evaluation will be completed by another provider, this initial triage assessment does not replace that evaluation, and the importance of remaining in the ED until their evaluation is complete.     Tanda Rockers, PA-C 02/03/21 1651    Benjiman Core, MD 02/03/21 2117

## 2021-02-04 ENCOUNTER — Emergency Department (HOSPITAL_COMMUNITY): Payer: BC Managed Care – PPO

## 2021-02-04 DIAGNOSIS — L0390 Cellulitis, unspecified: Secondary | ICD-10-CM | POA: Diagnosis present

## 2021-02-04 LAB — SYNOVIAL CELL COUNT + DIFF, W/ CRYSTALS
Crystals, Fluid: NONE SEEN
Lymphocytes-Synovial Fld: 5 % (ref 0–20)
Monocyte-Macrophage-Synovial Fluid: 7 % — ABNORMAL LOW (ref 50–90)
Neutrophil, Synovial: 88 % — ABNORMAL HIGH (ref 0–25)
WBC, Synovial: 1245 /mm3 — ABNORMAL HIGH (ref 0–200)

## 2021-02-04 LAB — RESP PANEL BY RT-PCR (FLU A&B, COVID) ARPGX2
Influenza A by PCR: NEGATIVE
Influenza B by PCR: NEGATIVE
SARS Coronavirus 2 by RT PCR: NEGATIVE

## 2021-02-04 MED ORDER — HYDROMORPHONE HCL 1 MG/ML IJ SOLN
1.0000 mg | Freq: Once | INTRAMUSCULAR | Status: AC
  Administered 2021-02-04: 1 mg via INTRAVENOUS
  Filled 2021-02-04: qty 1

## 2021-02-04 MED ORDER — SULFAMETHOXAZOLE-TRIMETHOPRIM 800-160 MG PO TABS: 1.0000 | ORAL_TABLET | Freq: Two times a day (BID) | ORAL | 0 refills | Status: DC

## 2021-02-04 MED ORDER — IOHEXOL 350 MG/ML SOLN
75.0000 mL | Freq: Once | INTRAVENOUS | Status: AC | PRN
  Administered 2021-02-04: 75 mL via INTRAVENOUS

## 2021-02-04 MED ORDER — HYDROCODONE-ACETAMINOPHEN 5-325 MG PO TABS
1.0000 | ORAL_TABLET | Freq: Once | ORAL | Status: AC
  Administered 2021-02-04: 1 via ORAL
  Filled 2021-02-04: qty 1

## 2021-02-04 MED ORDER — DEXTROSE 5 % IV SOLN
1500.0000 mg | Freq: Once | INTRAVENOUS | Status: DC
  Filled 2021-02-04: qty 75

## 2021-02-04 MED ORDER — KETOROLAC TROMETHAMINE 15 MG/ML IJ SOLN
7.5000 mg | Freq: Once | INTRAMUSCULAR | Status: AC
  Administered 2021-02-04: 7.5 mg via INTRAVENOUS
  Filled 2021-02-04: qty 1

## 2021-02-04 MED ORDER — LIDOCAINE HCL (PF) 1 % IJ SOLN
5.0000 mL | Freq: Once | INTRAMUSCULAR | Status: AC
  Administered 2021-02-04: 5 mL via INTRADERMAL
  Filled 2021-02-04: qty 5

## 2021-02-04 MED ORDER — SULFAMETHOXAZOLE-TRIMETHOPRIM 400-80 MG/5ML IV SOLN
160.0000 mg | Freq: Once | INTRAVENOUS | Status: AC
  Administered 2021-02-04: 160 mg via INTRAVENOUS
  Filled 2021-02-04: qty 10

## 2021-02-04 MED ORDER — SODIUM CHLORIDE 0.9 % IV BOLUS
1000.0000 mL | Freq: Once | INTRAVENOUS | Status: AC
  Administered 2021-02-04: 1000 mL via INTRAVENOUS

## 2021-02-04 MED ORDER — HYDROMORPHONE HCL 1 MG/ML IJ SOLN
0.5000 mg | Freq: Once | INTRAMUSCULAR | Status: AC
  Administered 2021-02-04: 0.5 mg via INTRAVENOUS
  Filled 2021-02-04: qty 1

## 2021-02-04 MED ORDER — HYDROCODONE-ACETAMINOPHEN 5-325 MG PO TABS: 1.0000 | ORAL_TABLET | Freq: Three times a day (TID) | ORAL | 0 refills | Status: DC | PRN

## 2021-02-04 NOTE — ED Notes (Signed)
Patient transported to CT 

## 2021-02-04 NOTE — ED Notes (Signed)
Pt specimens walked down to the lab

## 2021-02-04 NOTE — Progress Notes (Deleted)
Pharmacy Note:  Dalbavancin for Acute Bacterial Skin and Skin Structure Infection (ABSSSI) Patients to [facility] Discharge Loretta Doutt is an 70 y.o. female who presented to [facility] on 02/03/2021 with an Acute Bacterial Skin and Skin Structure Infection  Inclusion criteria - Indication []  Moderately large skin lesion (>=75 cm2 or larger - about the size of a baseball) [x]  Cellulitis  Inclusion Criteria - at least one SIRS criteria present []  WBC > 12,000 or < 4000 [x]  temp >100.9 or < 96.8 []  heart rate >90[]  respiratory rate >20  Patient was evaluated for the following exclusion criteria and no exclusions were found  Hardware involvement, Hypotension / shock, Elevated lactate (>2) without other explanation, gram-negative infection risk factors (bites, water exposure, infection after trauma, infection after skin graft, neutropenia, burns, severe immunocompromise), necrotizing fasciitis possible or confirmed, Known or suspected osteomyelitis or septic arthritis, endocarditis, diabetic foot infection, ischemic ulcers, post-operative wound infection, perirectal infections, need for drainage in the operating room, hand or facial infections, injection drug users with a fever, bacteremia, pregnancy or breastfeeding, allergy to related antibiotics like vancomycin, known liver disease (t.bili >2x ULN or AST/ALT 3x ULN)  , PharmD, BCPS Please see amion for complete clinical pharmacist phone list 02/04/2021, 4:01 AM Clinical Pharmacist

## 2021-02-04 NOTE — ED Notes (Signed)
Per MD do not need second lactic   

## 2021-02-04 NOTE — Discharge Instructions (Addendum)
For pain control you may take at 1000 mg of Tylenol every 8 hours scheduled.  In addition you can take 0.5 to 1 tablet of Norco/Vicodin every 8 hours for pain not controlled with the scheduled Tylenol.  

## 2021-02-04 NOTE — ED Notes (Signed)
Notified RN of patients BP after adjusting the cuff and retaking it twice

## 2021-02-06 ENCOUNTER — Inpatient Hospital Stay (HOSPITAL_COMMUNITY): Payer: BC Managed Care – PPO

## 2021-02-06 ENCOUNTER — Encounter (HOSPITAL_COMMUNITY): Payer: Self-pay | Admitting: Emergency Medicine

## 2021-02-06 ENCOUNTER — Inpatient Hospital Stay (HOSPITAL_COMMUNITY): Payer: BC Managed Care – PPO | Admitting: Anesthesiology

## 2021-02-06 ENCOUNTER — Inpatient Hospital Stay (HOSPITAL_COMMUNITY)
Admission: EM | Admit: 2021-02-06 | Discharge: 2021-02-13 | DRG: 854 | Disposition: A | Discharge status: Home Health | Payer: BC Managed Care – PPO | Attending: Internal Medicine | Admitting: Internal Medicine

## 2021-02-06 ENCOUNTER — Other Ambulatory Visit: Payer: Self-pay

## 2021-02-06 ENCOUNTER — Encounter (HOSPITAL_COMMUNITY)
Admission: EM | Disposition: A | Discharge status: Home / Self Care | Payer: Self-pay | Source: Home / Self Care | Attending: Internal Medicine

## 2021-02-06 DIAGNOSIS — E119 Type 2 diabetes mellitus without complications: Secondary | ICD-10-CM | POA: Diagnosis present

## 2021-02-06 DIAGNOSIS — M009 Pyogenic arthritis, unspecified: Secondary | ICD-10-CM | POA: Diagnosis present

## 2021-02-06 DIAGNOSIS — L02511 Cutaneous abscess of right hand: Secondary | ICD-10-CM | POA: Diagnosis not present

## 2021-02-06 DIAGNOSIS — E1165 Type 2 diabetes mellitus with hyperglycemia: Secondary | ICD-10-CM | POA: Diagnosis not present

## 2021-02-06 DIAGNOSIS — L03113 Cellulitis of right upper limb: Secondary | ICD-10-CM | POA: Diagnosis present

## 2021-02-06 DIAGNOSIS — Z7984 Long term (current) use of oral hypoglycemic drugs: Secondary | ICD-10-CM

## 2021-02-06 DIAGNOSIS — E871 Hypo-osmolality and hyponatremia: Secondary | ICD-10-CM | POA: Insufficient documentation

## 2021-02-06 DIAGNOSIS — Z791 Long term (current) use of non-steroidal anti-inflammatories (NSAID): Secondary | ICD-10-CM | POA: Diagnosis not present

## 2021-02-06 DIAGNOSIS — M19031 Primary osteoarthritis, right wrist: Secondary | ICD-10-CM

## 2021-02-06 DIAGNOSIS — L02413 Cutaneous abscess of right upper limb: Secondary | ICD-10-CM | POA: Diagnosis present

## 2021-02-06 DIAGNOSIS — D638 Anemia in other chronic diseases classified elsewhere: Secondary | ICD-10-CM | POA: Diagnosis present

## 2021-02-06 DIAGNOSIS — A419 Sepsis, unspecified organism: Principal | ICD-10-CM | POA: Diagnosis present

## 2021-02-06 DIAGNOSIS — N179 Acute kidney failure, unspecified: Secondary | ICD-10-CM | POA: Insufficient documentation

## 2021-02-06 DIAGNOSIS — Z8249 Family history of ischemic heart disease and other diseases of the circulatory system: Secondary | ICD-10-CM | POA: Diagnosis not present

## 2021-02-06 DIAGNOSIS — D72829 Elevated white blood cell count, unspecified: Secondary | ICD-10-CM | POA: Diagnosis not present

## 2021-02-06 DIAGNOSIS — Z20822 Contact with and (suspected) exposure to covid-19: Secondary | ICD-10-CM | POA: Diagnosis present

## 2021-02-06 DIAGNOSIS — Z7982 Long term (current) use of aspirin: Secondary | ICD-10-CM | POA: Diagnosis not present

## 2021-02-06 DIAGNOSIS — Z79899 Other long term (current) drug therapy: Secondary | ICD-10-CM | POA: Diagnosis not present

## 2021-02-06 DIAGNOSIS — E7800 Pure hypercholesterolemia, unspecified: Secondary | ICD-10-CM | POA: Diagnosis present

## 2021-02-06 DIAGNOSIS — I10 Essential (primary) hypertension: Secondary | ICD-10-CM | POA: Diagnosis present

## 2021-02-06 DIAGNOSIS — G5601 Carpal tunnel syndrome, right upper limb: Secondary | ICD-10-CM | POA: Diagnosis present

## 2021-02-06 DIAGNOSIS — D649 Anemia, unspecified: Secondary | ICD-10-CM | POA: Diagnosis not present

## 2021-02-06 DIAGNOSIS — Z833 Family history of diabetes mellitus: Secondary | ICD-10-CM

## 2021-02-06 HISTORY — PX: I & D EXTREMITY: SHX5045

## 2021-02-06 LAB — CBC WITH DIFFERENTIAL/PLATELET
Abs Immature Granulocytes: 0 10*3/uL (ref 0.00–0.07)
Basophils Absolute: 0 10*3/uL (ref 0.0–0.1)
Basophils Relative: 0 %
Eosinophils Absolute: 0 10*3/uL (ref 0.0–0.5)
Eosinophils Relative: 0 %
HCT: 28.4 % — ABNORMAL LOW (ref 36.0–46.0)
Hemoglobin: 9.1 g/dL — ABNORMAL LOW (ref 12.0–15.0)
Lymphocytes Relative: 4 %
Lymphs Abs: 1.2 10*3/uL (ref 0.7–4.0)
MCH: 27.4 pg (ref 26.0–34.0)
MCHC: 32 g/dL (ref 30.0–36.0)
MCV: 85.5 fL (ref 80.0–100.0)
Monocytes Absolute: 0.6 10*3/uL (ref 0.1–1.0)
Monocytes Relative: 2 %
Neutro Abs: 28.2 10*3/uL — ABNORMAL HIGH (ref 1.7–7.7)
Neutrophils Relative %: 94 %
Platelets: 243 10*3/uL (ref 150–400)
RBC: 3.32 MIL/uL — ABNORMAL LOW (ref 3.87–5.11)
RDW: 14.6 % (ref 11.5–15.5)
WBC: 30 10*3/uL — ABNORMAL HIGH (ref 4.0–10.5)
nRBC: 0 % (ref 0.0–0.2)
nRBC: 1 /100 WBC — ABNORMAL HIGH

## 2021-02-06 LAB — COMPREHENSIVE METABOLIC PANEL
ALT: 24 U/L (ref 0–44)
AST: 26 U/L (ref 15–41)
Albumin: 2.3 g/dL — ABNORMAL LOW (ref 3.5–5.0)
Alkaline Phosphatase: 99 U/L (ref 38–126)
Anion gap: 10 (ref 5–15)
BUN: 30 mg/dL — ABNORMAL HIGH (ref 8–23)
CO2: 21 mmol/L — ABNORMAL LOW (ref 22–32)
Calcium: 8.4 mg/dL — ABNORMAL LOW (ref 8.9–10.3)
Chloride: 99 mmol/L (ref 98–111)
Creatinine, Ser: 2 mg/dL — ABNORMAL HIGH (ref 0.44–1.00)
GFR, Estimated: 27 mL/min — ABNORMAL LOW (ref >60)
Glucose, Bld: 71 mg/dL (ref 70–99)
Potassium: 3.6 mmol/L (ref 3.5–5.1)
Sodium: 130 mmol/L — ABNORMAL LOW (ref 135–145)
Total Bilirubin: 0.5 mg/dL (ref 0.3–1.2)
Total Protein: 6.3 g/dL — ABNORMAL LOW (ref 6.5–8.1)

## 2021-02-06 LAB — RESP PANEL BY RT-PCR (FLU A&B, COVID) ARPGX2
Influenza A by PCR: NEGATIVE
Influenza B by PCR: NEGATIVE
SARS Coronavirus 2 by RT PCR: NEGATIVE

## 2021-02-06 LAB — CBG MONITORING, ED: Glucose-Capillary: 112 mg/dL — ABNORMAL HIGH (ref 70–99)

## 2021-02-06 LAB — HIV ANTIBODY (ROUTINE TESTING W REFLEX): HIV Screen 4th Generation wRfx: NONREACTIVE

## 2021-02-06 LAB — LACTIC ACID, PLASMA
Lactic Acid, Venous: 2.3 mmol/L (ref 0.5–1.9)
Lactic Acid, Venous: 1.4 mmol/L (ref 0.5–1.9)

## 2021-02-06 LAB — HEMOGLOBIN A1C
Hgb A1c MFr Bld: 6.4 % — ABNORMAL HIGH (ref 4.8–5.6)
Mean Plasma Glucose: 136.98 mg/dL

## 2021-02-06 LAB — GLUCOSE, CAPILLARY: Glucose-Capillary: 87 mg/dL (ref 70–99)

## 2021-02-06 SURGERY — IRRIGATION AND DEBRIDEMENT EXTREMITY
Anesthesia: General | Laterality: Right

## 2021-02-06 MED ORDER — FENTANYL CITRATE (PF) 100 MCG/2ML IJ SOLN
INTRAMUSCULAR | Status: AC
  Filled 2021-02-06: qty 2

## 2021-02-06 MED ORDER — ASPIRIN EC 81 MG PO TBEC
81.0000 mg | DELAYED_RELEASE_TABLET | Freq: Every day | ORAL | Status: DC
  Administered 2021-02-07: 81 mg via ORAL
  Filled 2021-02-06: qty 1

## 2021-02-06 MED ORDER — SODIUM CHLORIDE 0.9 % IV SOLN
2.0000 g | INTRAVENOUS | Status: DC
  Administered 2021-02-06 – 2021-02-12 (×7): 2 g via INTRAVENOUS
  Filled 2021-02-06 (×7): qty 20

## 2021-02-06 MED ORDER — 0.9 % SODIUM CHLORIDE (POUR BTL) OPTIME
TOPICAL | Status: DC | PRN
  Administered 2021-02-06: 1000 mL

## 2021-02-06 MED ORDER — CHLORHEXIDINE GLUCONATE 4 % EX LIQD: 60.0000 mL | Freq: Once | CUTANEOUS | Status: DC

## 2021-02-06 MED ORDER — INSULIN ASPART 100 UNIT/ML IJ SOLN
0.0000 [IU] | Freq: Three times a day (TID) | INTRAMUSCULAR | Status: DC
  Administered 2021-02-07 – 2021-02-09 (×2): 1 [IU] via SUBCUTANEOUS

## 2021-02-06 MED ORDER — SODIUM CHLORIDE 0.9 % IR SOLN
Status: DC | PRN
  Administered 2021-02-06: 1000 mL
  Administered 2021-02-06: 3000 mL

## 2021-02-06 MED ORDER — PROPOFOL 10 MG/ML IV BOLUS
INTRAVENOUS | Status: AC
  Filled 2021-02-06: qty 20

## 2021-02-06 MED ORDER — POVIDONE-IODINE 10 % EX SWAB: 2.0000 "application " | Freq: Once | CUTANEOUS | Status: DC

## 2021-02-06 MED ORDER — LORAZEPAM 0.5 MG PO TABS
0.5000 mg | ORAL_TABLET | ORAL | Status: DC | PRN
Start: 2021-02-06 — End: 2021-02-13

## 2021-02-06 MED ORDER — HEPARIN SODIUM (PORCINE) 5000 UNIT/ML IJ SOLN: 5000.0000 [IU] | Freq: Two times a day (BID) | INTRAMUSCULAR | Status: DC

## 2021-02-06 MED ORDER — HYDRALAZINE HCL 25 MG PO TABS: 25.0000 mg | ORAL_TABLET | Freq: Four times a day (QID) | ORAL | Status: DC | PRN

## 2021-02-06 MED ORDER — SODIUM CHLORIDE 0.9 % IV SOLN: INTRAVENOUS | Status: AC

## 2021-02-06 MED ORDER — SUGAMMADEX SODIUM 200 MG/2ML IV SOLN
INTRAVENOUS | Status: DC | PRN
Start: 2021-02-06 — End: 2021-02-06
  Administered 2021-02-06: 200 mg via INTRAVENOUS

## 2021-02-06 MED ORDER — PHENYLEPHRINE HCL (PRESSORS) 10 MG/ML IV SOLN
INTRAVENOUS | Status: DC | PRN
  Administered 2021-02-06 (×3): 80 ug via INTRAVENOUS
  Administered 2021-02-06: 120 ug via INTRAVENOUS

## 2021-02-06 MED ORDER — PROPOFOL 10 MG/ML IV BOLUS
INTRAVENOUS | Status: DC | PRN
  Administered 2021-02-06: 120 mg via INTRAVENOUS

## 2021-02-06 MED ORDER — FENTANYL CITRATE (PF) 100 MCG/2ML IJ SOLN
INTRAMUSCULAR | Status: DC | PRN
  Administered 2021-02-06 (×3): 50 ug via INTRAVENOUS

## 2021-02-06 MED ORDER — SUCCINYLCHOLINE CHLORIDE 200 MG/10ML IV SOSY
PREFILLED_SYRINGE | INTRAVENOUS | Status: DC | PRN
  Administered 2021-02-06: 80 mg via INTRAVENOUS

## 2021-02-06 MED ORDER — ONDANSETRON HCL 4 MG/2ML IJ SOLN
INTRAMUSCULAR | Status: DC | PRN
  Administered 2021-02-06: 4 mg via INTRAVENOUS

## 2021-02-06 MED ORDER — HYDROCODONE-ACETAMINOPHEN 5-325 MG PO TABS
1.0000 | ORAL_TABLET | Freq: Three times a day (TID) | ORAL | Status: DC | PRN
  Administered 2021-02-07 – 2021-02-13 (×12): 1 via ORAL
  Filled 2021-02-06 (×14): qty 1

## 2021-02-06 MED ORDER — LIDOCAINE 2% (20 MG/ML) 5 ML SYRINGE
INTRAMUSCULAR | Status: DC | PRN
  Administered 2021-02-06: 60 mg via INTRAVENOUS

## 2021-02-06 MED ORDER — SODIUM CHLORIDE 0.9 % IV BOLUS
1000.0000 mL | Freq: Once | INTRAVENOUS | Status: AC
  Administered 2021-02-06: 1000 mL via INTRAVENOUS

## 2021-02-06 MED ORDER — FENTANYL CITRATE (PF) 250 MCG/5ML IJ SOLN
INTRAMUSCULAR | Status: AC
  Filled 2021-02-06: qty 5

## 2021-02-06 MED ORDER — ACETAMINOPHEN 10 MG/ML IV SOLN
INTRAVENOUS | Status: DC | PRN
  Administered 2021-02-06: 1000 mg via INTRAVENOUS

## 2021-02-06 MED ORDER — HYDROMORPHONE HCL 1 MG/ML IJ SOLN
0.5000 mg | INTRAMUSCULAR | Status: DC | PRN
  Administered 2021-02-06 – 2021-02-07 (×2): 0.5 mg via INTRAVENOUS
  Administered 2021-02-07 – 2021-02-13 (×8): 1 mg via INTRAVENOUS
  Filled 2021-02-06 (×11): qty 1

## 2021-02-06 MED ORDER — OXYCODONE-ACETAMINOPHEN 5-325 MG PO TABS
1.0000 | ORAL_TABLET | Freq: Once | ORAL | Status: AC
  Administered 2021-02-06: 1 via ORAL
  Filled 2021-02-06: qty 1

## 2021-02-06 MED ORDER — VANCOMYCIN HCL IN DEXTROSE 1-5 GM/200ML-% IV SOLN
1000.0000 mg | Freq: Once | INTRAVENOUS | Status: AC
  Administered 2021-02-06: 1000 mg via INTRAVENOUS
  Filled 2021-02-06: qty 200

## 2021-02-06 MED ORDER — ONDANSETRON HCL 4 MG/2ML IJ SOLN
INTRAMUSCULAR | Status: AC
  Filled 2021-02-06: qty 2

## 2021-02-06 MED ORDER — SUCCINYLCHOLINE CHLORIDE 200 MG/10ML IV SOSY
PREFILLED_SYRINGE | INTRAVENOUS | Status: AC
  Filled 2021-02-06: qty 10

## 2021-02-06 MED ORDER — ROCURONIUM BROMIDE 100 MG/10ML IV SOLN
INTRAVENOUS | Status: DC | PRN
Start: 2021-02-06 — End: 2021-02-06
  Administered 2021-02-06: 30 mg via INTRAVENOUS

## 2021-02-06 MED ORDER — LACTATED RINGERS IV SOLN: INTRAVENOUS | Status: DC | PRN

## 2021-02-06 MED ORDER — EPHEDRINE SULFATE 50 MG/ML IJ SOLN
INTRAMUSCULAR | Status: DC | PRN
  Administered 2021-02-06: 5 mg via INTRAVENOUS

## 2021-02-06 MED ORDER — LIDOCAINE 2% (20 MG/ML) 5 ML SYRINGE
INTRAMUSCULAR | Status: AC
  Filled 2021-02-06: qty 5

## 2021-02-06 MED ORDER — DEXAMETHASONE SODIUM PHOSPHATE 10 MG/ML IJ SOLN
INTRAMUSCULAR | Status: DC | PRN
  Administered 2021-02-06: 5 mg via INTRAVENOUS

## 2021-02-06 MED ORDER — ONDANSETRON HCL 4 MG/2ML IJ SOLN: 4.0000 mg | Freq: Once | INTRAMUSCULAR | Status: DC | PRN

## 2021-02-06 MED ORDER — FENTANYL CITRATE (PF) 100 MCG/2ML IJ SOLN
25.0000 ug | INTRAMUSCULAR | Status: DC | PRN
  Administered 2021-02-06 (×2): 50 ug via INTRAVENOUS

## 2021-02-06 MED ORDER — BUPIVACAINE HCL (PF) 0.25 % IJ SOLN
INTRAMUSCULAR | Status: AC
  Filled 2021-02-06: qty 30

## 2021-02-06 MED ORDER — ACETAMINOPHEN 10 MG/ML IV SOLN
INTRAVENOUS | Status: AC
  Filled 2021-02-06: qty 100

## 2021-02-06 SURGICAL SUPPLY — 64 items
BAG COUNTER SPONGE SURGICOUNT (BAG) ×2 IMPLANT
BAG SPNG CNTER NS LX DISP (BAG) ×1
BAG SURGICOUNT SPONGE COUNTING (BAG) ×1
BNDG CMPR 9X4 STRL LF SNTH (GAUZE/BANDAGES/DRESSINGS) ×1
BNDG COHESIVE 1X5 TAN STRL LF (GAUZE/BANDAGES/DRESSINGS) IMPLANT
BNDG CONFORM 2 STRL LF (GAUZE/BANDAGES/DRESSINGS) IMPLANT
BNDG ELASTIC 3X5.8 VLCR STR LF (GAUZE/BANDAGES/DRESSINGS) ×3 IMPLANT
BNDG ELASTIC 4X5.8 VLCR STR LF (GAUZE/BANDAGES/DRESSINGS) ×3 IMPLANT
BNDG ESMARK 4X9 LF (GAUZE/BANDAGES/DRESSINGS) ×3 IMPLANT
BNDG GAUZE ELAST 4 BULKY (GAUZE/BANDAGES/DRESSINGS) ×3 IMPLANT
CORD BIPOLAR FORCEPS 12FT (ELECTRODE) ×3 IMPLANT
COVER SURGICAL LIGHT HANDLE (MISCELLANEOUS) ×3 IMPLANT
CUFF TOURN SGL QUICK 18X4 (TOURNIQUET CUFF) ×3 IMPLANT
CUFF TOURN SGL QUICK 24 (TOURNIQUET CUFF)
CUFF TRNQT CYL 24X4X16.5-23 (TOURNIQUET CUFF) IMPLANT
DRAIN PENROSE 1/4X12 LTX STRL (WOUND CARE) IMPLANT
DRAPE SURG 17X23 STRL (DRAPES) ×3 IMPLANT
DRSG ADAPTIC 3X8 NADH LF (GAUZE/BANDAGES/DRESSINGS) ×3 IMPLANT
DRSG EMULSION OIL 3X3 NADH (GAUZE/BANDAGES/DRESSINGS) ×4 IMPLANT
ELECT REM PT RETURN 9FT ADLT (ELECTROSURGICAL)
ELECTRODE REM PT RTRN 9FT ADLT (ELECTROSURGICAL) IMPLANT
GAUZE SPONGE 4X4 12PLY STRL (GAUZE/BANDAGES/DRESSINGS) ×5 IMPLANT
GAUZE XEROFORM 1X8 LF (GAUZE/BANDAGES/DRESSINGS) ×3 IMPLANT
GAUZE XEROFORM 5X9 LF (GAUZE/BANDAGES/DRESSINGS) IMPLANT
GLOVE SURG ORTHO LTX SZ8 (GLOVE) ×3 IMPLANT
GLOVE SURG UNDER POLY LF SZ8.5 (GLOVE) ×3 IMPLANT
GOWN STRL REUS W/ TWL LRG LVL3 (GOWN DISPOSABLE) ×3 IMPLANT
GOWN STRL REUS W/ TWL XL LVL3 (GOWN DISPOSABLE) ×1 IMPLANT
GOWN STRL REUS W/TWL LRG LVL3 (GOWN DISPOSABLE) ×9
GOWN STRL REUS W/TWL XL LVL3 (GOWN DISPOSABLE) ×3
HANDPIECE INTERPULSE COAX TIP (DISPOSABLE)
KIT BASIN OR (CUSTOM PROCEDURE TRAY) ×3 IMPLANT
KIT TURNOVER KIT B (KITS) ×3 IMPLANT
MANIFOLD NEPTUNE II (INSTRUMENTS) ×3 IMPLANT
NDL HYPO 25GX1X1/2 BEV (NEEDLE) IMPLANT
NEEDLE HYPO 25GX1X1/2 BEV (NEEDLE) IMPLANT
NS IRRIG 1000ML POUR BTL (IV SOLUTION) ×3 IMPLANT
PACK ORTHO EXTREMITY (CUSTOM PROCEDURE TRAY) ×3 IMPLANT
PAD ARMBOARD 7.5X6 YLW CONV (MISCELLANEOUS) ×6 IMPLANT
PAD CAST 4YDX4 CTTN HI CHSV (CAST SUPPLIES) ×1 IMPLANT
PADDING CAST COTTON 4X4 STRL (CAST SUPPLIES) ×3
SET CYSTO W/LG BORE CLAMP LF (SET/KITS/TRAYS/PACK) ×2 IMPLANT
SET HNDPC FAN SPRY TIP SCT (DISPOSABLE) IMPLANT
SOAP 2 % CHG 4 OZ (WOUND CARE) ×3 IMPLANT
SPONGE T-LAP 12X12 ~~LOC~~+RFID (SPONGE) ×4 IMPLANT
SPONGE T-LAP 18X18 ~~LOC~~+RFID (SPONGE) ×1 IMPLANT
SPONGE T-LAP 4X18 ~~LOC~~+RFID (SPONGE) ×1 IMPLANT
SUCTION FRAZIER HANDLE 10FR (MISCELLANEOUS) ×3
SUCTION TUBE FRAZIER 10FR DISP (MISCELLANEOUS) IMPLANT
SUT ETHILON 4 0 PS 2 18 (SUTURE) IMPLANT
SUT ETHILON 5 0 P 3 18 (SUTURE)
SUT MNCRL AB 3-0 PS2 18 (SUTURE) ×2 IMPLANT
SUT NYLON ETHILON 5-0 P-3 1X18 (SUTURE) IMPLANT
SUT PROLENE 3 0 PS 1 (SUTURE) ×6 IMPLANT
SWAB COLLECTION DEVICE MRSA (MISCELLANEOUS) ×3 IMPLANT
SWAB CULTURE ESWAB REG 1ML (MISCELLANEOUS) ×2 IMPLANT
SYR CONTROL 10ML LL (SYRINGE) IMPLANT
TOWEL GREEN STERILE (TOWEL DISPOSABLE) ×3 IMPLANT
TOWEL GREEN STERILE FF (TOWEL DISPOSABLE) ×3 IMPLANT
TUBE CONNECTING 12'X1/4 (SUCTIONS) ×1
TUBE CONNECTING 12X1/4 (SUCTIONS) ×2 IMPLANT
UNDERPAD 30X36 HEAVY ABSORB (UNDERPADS AND DIAPERS) ×3 IMPLANT
WATER STERILE IRR 1000ML POUR (IV SOLUTION) ×3 IMPLANT
YANKAUER SUCT BULB TIP NO VENT (SUCTIONS) ×3 IMPLANT

## 2021-02-06 NOTE — H&P (Signed)
History and Physical    Christy Higgins NUU:[id number] DOB: 01/24/1951 DOA: 02/06/2021  PCP: Lindaann Pascal, PA-C (Confirm with patient/family/NH records and if not entered, this has to be entered at [facility] point of entry) Patient coming from: Home  I have personally briefly reviewed patient's old medical records in [facility]  Chief Complaint: Right hand swelling and pain  HPI: Christy Higgins is a 70 y.o. female with medical history significant of HTN, IIDM, HLD came with worsening of right wrist and right hand swelling and pain.  Symptoms started last Friday, patient woke up with severe swelling of the right wrist/right hand and neck pain.  Came to ED that day, arthrocentesis of the right wrist was done which showed WBC 1500, crystal negative.  Patient was diagnosed with right hand cellulitis and sent home with pain medication and Bactrim twice daily.  In the last 4 days, patient has been taking antibiotics, ibuprofen for pain, however the right wrist swelling progressed, now involve the whole right hand, and the pain became uncontrolled today.  He had episode of chills at home but no fevers.  He feels numbness of the all 5 fingertips, unable to make fist. No Hx of Gout, no new medication.  ED Course: Temperature 99.2, physical exam showed severe right hand swelling.  WBC 30, blood work showed AKI with creatinine 2.0, bicarb 21, K3.6.  Review of Systems: As per HPI otherwise 14 point review of systems negative.    Past Medical History:  Diagnosis Date   Diabetes mellitus    High cholesterol    Hypertension     Past Surgical History:  Procedure Laterality Date   CESAREAN SECTION     TONSILLECTOMY       reports that she has never smoked. She has never used smokeless tobacco. She reports that she does not drink alcohol and does not use drugs.  No Known Allergies  Family History  Problem Relation Age of Onset   Hypertension Mother    Diabetes Father    Hypertension Father     Hypertension Sister    Diabetes Sister    Hypertension Sister    Diabetes Sister    Hypertension Sister    Diabetes Sister      Prior to Admission medications   Medication Sig Start Date End Date Taking? Authorizing Provider  acetaminophen-codeine (TYLENOL #3) 300-30 MG per tablet Take 2 tablets by mouth 2 (two) times daily as needed for moderate pain. 03/31/13   [provider]  aspirin EC 81 MG tablet Take 81 mg by mouth daily.    [provider]  diltiazem (CARDIZEM CD) 180 MG 24 hr capsule Take 1 capsule (180 mg total) by mouth daily. Patient not taking: Reported on 02/04/2021 07/26/18 01/24/19  Toniann Fail, NP  HYDROcodone-acetaminophen (NORCO/VICODIN) 5-325 MG tablet Take 1 tablet by mouth every 8 (eight) hours as needed for up to 5 days for severe pain (That is not improved by your scheduled acetaminophen regimen). Please do not exceed 4000 mg of acetaminophen (Tylenol) a 24-hour period. Please note that he may be prescribed additional medicine that contains acetaminophen. 02/04/21 02/09/21  Nira Conn, MD  losartan (COZAAR) 100 MG tablet Take 100 mg by mouth daily. 12/14/18   [provider]  meloxicam (MOBIC) 7.5 MG tablet Take 7.5 mg by mouth daily. 01/31/21   [provider]  metFORMIN (GLUCOPHAGE-XR) 750 MG 24 hr tablet Take 750 mg by mouth every evening. 12/14/18   [provider]  sulfamethoxazole-trimethoprim (BACTRIM DS) 800-160 MG tablet Take 1 tablet by mouth 2 (two) times daily for 14 days. 02/04/21 02/18/21  Nira Conn, MD  traMADol (ULTRAM) 50 MG tablet Take 50 mg by mouth every 6 (six) hours as needed. 01/31/21   [provider]  Vitamin D, Ergocalciferol, (DRISDOL) 1.25 MG (50000 UT) CAPS capsule Take 50,000 Units by mouth every Friday. 10/13/18   [provider]    Physical Exam: Vitals:   02/06/21 1335 02/06/21 1339  BP: (!) 124/101   Pulse: (!) 51 64  Resp: 20   Temp:  99.1 F (37.3 C)   TempSrc: Oral   SpO2: (!) 89% 98%    Constitutional: NAD, calm, comfortable Vitals:   02/06/21 1335 02/06/21 1339  BP: (!) 124/101   Pulse: (!) 51 64  Resp: 20   Temp: 99.1 F (37.3 C)   TempSrc: Oral   SpO2: (!) 89% 98%   Eyes: PERRL, lids and conjunctivae normal ENMT: Mucous membranes are moist. Posterior pharynx clear of any exudate or lesions.Normal dentition.  Neck: normal, supple, no masses, no thyromegaly Respiratory: clear to auscultation bilaterally, no wheezing, no crackles. Normal respiratory effort. No accessory muscle use.  Cardiovascular: Regular rate and rhythm, no murmurs / rubs / gallops. No extremity edema. 2+ pedal pulses. No carotid bruits.  Abdomen: no tenderness, no masses palpated. No hepatosplenomegaly. Bowel sounds positive.  Musculoskeletal: Severe swelling of the right hand post dorsal and ventral side, unable to make fist, decreased light touch sensation all 5 fingertips.  Capillary refill within normal limits.  Severe tenderness of the right ulnar side Skin: no rashes, lesions, ulcers. No induration Neurologic: CN 2-12 grossly intact. Sensation intact, DTR normal. Strength 5/5 in all 4.  Psychiatric: Normal judgment and insight. Alert and oriented x 3. Normal mood.      Labs on Admission: I have personally reviewed following labs and imaging studies  CBC: Recent Labs  Lab 02/03/21 1655 02/06/21 1346  WBC 18.4* 30.0*  NEUTROABS 15.3* 28.2*  HGB 10.9* 9.1*  HCT 34.4* 28.4*  MCV 86.9 85.5  PLT 219 243   Basic Metabolic Panel: Recent Labs  Lab 02/03/21 1655 02/06/21 1346  NA 136 130*  K 3.9 3.6  CL 101 99  CO2 24 21*  GLUCOSE 101* 71  BUN 17 30*  CREATININE 0.81 2.00*  CALCIUM 9.1 8.4*   GFR: CrCl cannot be calculated (Unknown ideal weight.). Liver Function Tests: Recent Labs  Lab 02/03/21 1655 02/06/21 1346  AST 41 26  ALT 37 24  ALKPHOS 90 99  BILITOT 1.1 0.5  PROT 7.7 6.3*  ALBUMIN 2.9* 2.3*   No  results for input(s): LIPASE, AMYLASE in the last 168 hours. No results for input(s): AMMONIA in the last 168 hours. Coagulation Profile: No results for input(s): INR, PROTIME in the last 168 hours. Cardiac Enzymes: No results for input(s): CKTOTAL, CKMB, CKMBINDEX, TROPONINI in the last 168 hours. BNP (last 3 results) No results for input(s): PROBNP in the last 8760 hours. HbA1C: No results for input(s): HGBA1C in the last 72 hours. CBG: No results for input(s): GLUCAP in the last 168 hours. Lipid Profile: No results for input(s): CHOL, HDL, LDLCALC, TRIG, CHOLHDL, LDLDIRECT in the last 72 hours. Thyroid Function Tests: No results for input(s): TSH, T4TOTAL, FREET4, T3FREE, THYROIDAB in the last 72 hours. Anemia Panel: No results for input(s): VITAMINB12, FOLATE, FERRITIN, TIBC, IRON, RETICCTPCT in the last 72 hours. Urine analysis:    Component Value Date/Time  COLORURINE YELLOW 07/25/2009 0821   APPEARANCEUR CLEAR 07/25/2009 0821   LABSPEC 1.016 07/25/2009 0821   PHURINE 7.0 07/25/2009 0821   GLUCOSEU NEGATIVE 07/25/2009 0821   HGBUR NEGATIVE 07/25/2009 0821   BILIRUBINUR NEGATIVE 07/25/2009 0821   KETONESUR NEGATIVE 07/25/2009 0821   PROTEINUR NEGATIVE 07/25/2009 0821   UROBILINOGEN 1.0 07/25/2009 0821   NITRITE NEGATIVE 07/25/2009 0821   LEUKOCYTESUR  07/25/2009 0821    NEGATIVE MICROSCOPIC NOT DONE ON URINES WITH NEGATIVE PROTEIN, BLOOD, LEUKOCYTES, NITRITE, OR GLUCOSE <1000 mg/dL.    Radiological Exams on Admission: No results found.  EKG: Independently reviewed. Sinus, no acute ST-T changes.  Assessment/Plan Active Problems:   * No active hospital problems. *  (please populate well all problems here in Problem List. (For example, if patient is on BP meds at home and you resume or decide to hold them, it is a problem that needs to be her. Same for CAD, COPD, HLD and so on)  Sepsis -Evidenced by elevated WBC count and lactic acid, source is right-handed  infection. -Clinically likely a deep infection/myositis/tendinitis, septic arthritis was ruled out couple days ago but now with worsening of soft tissue swelling and joint swelling and pain, again raised the concern. D/W on call hand surgery Dr. Melvyn Novas, who agreed with STAT MRI and will see the patient when MRI done. Hand surgery agreed with Vanco for now.  AKI -Probably secondary to sepsis and NSAIDS and Bactrim use. -Euvolemic, clinically -Check renal ultrasound. -IV bolus x1 and continue IV fluid, recheck BMP tomorrow. -Hold ARB and metformin.  Hyponatremia -Secondary to AKI, euvolemic, treat AKI then reevaluate.  HTN -ARB, start as needed hydralazine.  IIDM -Hold metformin, start sliding scale.  DVT prophylaxis: Heparin subcu Code Status: Full Code Family Communication: None at bedside Disposition Plan: Expect more than 2 midnight hospital stay to treat AKI and septic arthritis Consults called: Hand surgeon Dr. Melvyn Novas Admission status: Tele admit   Emeline General MD [facility] Pager [phone]  02/06/2021, 3:55 PM

## 2021-02-06 NOTE — ED Notes (Signed)
Pt stopped RN in hallway. Requested food, drink and pain medicine. 10/10 pain. Pt provided with food and drink.

## 2021-02-06 NOTE — ED Provider Notes (Signed)
Emergency Medicine Provider Triage Evaluation Note  Christy Higgins , a 70 y.o. female  was evaluated in triage.  Pt complains of worsening right wrist and hand pain/swelling.  Pressure review patient was seen on 10/16 and diagnosed with cellulitis.  Patient was prescribed Bactrim.  Reports that she has been taking this medication as prescribed.  Swelling and pain have gotten progressively worse since Monday.  Patient endorses chills.  No new falls or injuries.  Patient is right-hand dominant  Review of Systems  Positive: Swelling to right wrist and dorsum of right hand.  Decreased range of motion to right wrist and digits of right hand.  Chills Negative: Fevers, numbness, weakness  Physical Exam  BP (!) 124/101 (BP Location: Left Arm)   Pulse 64   Temp 99.1 F (37.3 C) (Oral)   Resp 20   SpO2 98%  Gen:   Awake, no distress   Resp:  Normal effort  MSK:   Moves extremities without difficulty; patient has diffuse swelling to right wrist and dorsum of right hand.  Overlying erythema.  Decreased range of motion to right wrist and digits of right hand. Other:  +2 right radial pulse.  Medical Decision Making  Medically screening exam initiated at 1:46 PM.  Appropriate orders placed.  Christy Higgins was informed that the remainder of the evaluation will be completed by another provider, this initial triage assessment does not replace that evaluation, and the importance of remaining in the ED until their evaluation is complete.  Right wrist and hand swelling/pain.   Haskel Schroeder, PA-C 02/06/21 1348    Gwyneth Sprout, MD 02/13/21 1547

## 2021-02-06 NOTE — ED Triage Notes (Signed)
Patient complains of right wrist and hand swelling that started last Thursday. Patient seen for same on Monday, reports swelling has gotten worse since then.

## 2021-02-06 NOTE — ED Notes (Signed)
Pt has 3+ swelling of right hand and wrist. Pt has 2+ right radial pulse, hot to touch wrist and hand is erythematous. Pt unable to close right hand due to swelling. Pt states fingers are numb. Cap refill less than 3 sec.

## 2021-02-06 NOTE — ED Notes (Signed)
Report given to OR.

## 2021-02-06 NOTE — ED Notes (Signed)
RN assisted pt in getting into gown and settled in the bed.  She reports she ate dinner prior to being moved to [place].  Transport came to and took pt to MRI.

## 2021-02-06 NOTE — ED Provider Notes (Signed)
[facility] EMERGENCY DEPARTMENT Provider Note   CSN: [id number] Arrival date & time: 02/06/21  1322     History Chief Complaint  Patient presents with   Wrist Pain    Christy Higgins is a 70 y.o. female resenting today with 2 weeks of right wrist pain and swelling.  Patient was seen 3 days ago for this and diagnosed with cellulitis.  The joint was also aspirated without signs of crystals and patient was not septic.  ED provider at that time believes she might need to be admitted for IV antibiotics.  Patient did not want to stay however now is coming back due to increased pain and swelling.  She was discharged with Bactrim, Vicodin and a follow-up with infectious disease.  She reports that she is been having fevers and chills over the last couple of days.  Her range of motion continues to be limited    Past Medical History:  Diagnosis Date   Diabetes mellitus    High cholesterol    Hypertension     Patient Active Problem List   Diagnosis Date Noted   Cellulitis 02/04/2021    Past Surgical History:  Procedure Laterality Date   CESAREAN SECTION     TONSILLECTOMY       OB History   No obstetric history on file.     Family History  Problem Relation Age of Onset   Hypertension Mother    Diabetes Father    Hypertension Father    Hypertension Sister    Diabetes Sister    Hypertension Sister    Diabetes Sister    Hypertension Sister    Diabetes Sister     Social History   Tobacco Use   Smoking status: Never   Smokeless tobacco: Never  Substance Use Topics   Alcohol use: No   Drug use: No    Home Medications Prior to Admission medications   Medication Sig Start Date End Date Taking? Authorizing Provider  acetaminophen-codeine (TYLENOL #3) 300-30 MG per tablet Take 2 tablets by mouth 2 (two) times daily as needed for moderate pain. 03/31/13   [provider]  aspirin EC 81 MG tablet Take 81 mg by mouth daily.    [provider]  diltiazem (CARDIZEM CD) 180 MG 24 hr capsule Take 1 capsule (180 mg total) by mouth daily. Patient not taking: Reported on 02/04/2021 07/26/18 01/24/19  Toniann Fail, NP  HYDROcodone-acetaminophen (NORCO/VICODIN) 5-325 MG tablet Take 1 tablet by mouth every 8 (eight) hours as needed for up to 5 days for severe pain (That is not improved by your scheduled acetaminophen regimen). Please do not exceed 4000 mg of acetaminophen (Tylenol) a 24-hour period. Please note that he may be prescribed additional medicine that contains acetaminophen. 02/04/21 02/09/21  Nira Conn, MD  losartan (COZAAR) 100 MG tablet Take 100 mg by mouth daily. 12/14/18   [provider]  meloxicam (MOBIC) 7.5 MG tablet Take 7.5 mg by mouth daily. 01/31/21   [provider]  metFORMIN (GLUCOPHAGE-XR) 750 MG 24 hr tablet Take 750 mg by mouth every evening. 12/14/18   [provider]  sulfamethoxazole-trimethoprim (BACTRIM DS) 800-160 MG tablet Take 1 tablet by mouth 2 (two) times daily for 14 days. 02/04/21 02/18/21  Nira Conn, MD  traMADol (ULTRAM) 50 MG tablet Take 50 mg by mouth every 6 (six) hours as needed. 01/31/21   [provider]  Vitamin D, Ergocalciferol, (DRISDOL) 1.25 MG (50000 UT) CAPS capsule Take  50,000 Units by mouth every Friday. 10/13/18   [provider]    Allergies    Patient has no known allergies.  Review of Systems   Review of Systems  Physical Exam Updated Vital Signs BP (!) 124/101 (BP Location: Left Arm)   Pulse 64   Temp 99.1 F (37.3 C) (Oral)   Resp 20   SpO2 98%   Physical Exam Vitals and nursing note reviewed.  Constitutional:      Appearance: Normal appearance.  HENT:     Head: Normocephalic and atraumatic.  Eyes:     General: No scleral icterus.    Conjunctiva/sclera: Conjunctivae normal.  Cardiovascular:     Rate and Rhythm: Normal rate and regular rhythm.  Pulmonary:     Effort: Pulmonary effort is  normal. No respiratory distress.  Musculoskeletal:        General: Swelling and tenderness present.     Comments: Patient with profuse inflammation to dorsal side of right hand.  Inflammation extends up to wrist.  Associated cellulitis.  Patient with full range of motion of all 5 digits.  Limited flexion, extension, radial and ulnar deviation due to swelling and pain.  Skin:    General: Skin is warm and dry.     Capillary Refill: Capillary refill takes less than 2 seconds.     Findings: Erythema present. No rash.  Neurological:     Mental Status: She is alert.  Psychiatric:        Mood and Affect: Mood normal.        Behavior: Behavior normal.    ED Results / Procedures / Treatments   Labs (all labs ordered are listed, but only abnormal results are displayed) Labs Reviewed  LACTIC ACID, PLASMA - Abnormal; Notable for the following components:      Result Value   Lactic Acid, Venous 2.3 (*)    All other components within normal limits  CBC WITH DIFFERENTIAL/PLATELET - Abnormal; Notable for the following components:   WBC 30.0 (*)    RBC 3.32 (*)    Hemoglobin 9.1 (*)    HCT 28.4 (*)    Neutro Abs 28.2 (*)    nRBC 1 (*)    All other components within normal limits  COMPREHENSIVE METABOLIC PANEL - Abnormal; Notable for the following components:   Sodium 130 (*)    CO2 21 (*)    BUN 30 (*)    Creatinine, Ser 2.00 (*)    Calcium 8.4 (*)    Total Protein 6.3 (*)    Albumin 2.3 (*)    GFR, Estimated 27 (*)    All other components within normal limits  RESP PANEL BY RT-PCR (FLU A&B, COVID) [id number]  LACTIC ACID, PLASMA    EKG None  Radiology No results found.  Procedures Procedures   Medications Ordered in ED Medications  vancomycin (VANCOCIN) IVPB 1000 mg/200 mL premix (has no administration in time range)  sodium chloride 0.9 % bolus 1,000 mL (has no administration in time range)  0.9 %  sodium chloride infusion (has no administration in time range)   oxyCODONE-acetaminophen (PERCOCET/ROXICET) 5-325 MG per tablet 1 tablet (1 tablet Oral Given 02/06/21 1454)    ED Course  I have reviewed the triage vital signs and the nursing notes.  Pertinent labs & imaging results that were available during my care of the patient were reviewed by me and considered in my medical decision making (see chart for details).    MDM Rules/Calculators/A&P Per chart review  patient was seen a few days ago for continued symptoms.  Patient's joint was aspirated at that time.  No sign of crystals, low suspicion for gout or pseudogout. Patient has been reporting chills and subjective fevers. She denied IV antibiotics 3 days ago however today she is willing to be admitted for further treatment.  She reports no known antibiotic allergies. Dr. Anitra Lauth looked at the joint with bedside US and saw no appreciable fluid collection.  Patient's lab work is revealing of a leukocytosis to 30.  Of note patient also with a creatinine of 2, up from 0.81 3 days ago.  Patient does endorse ibuprofen use due to her pain.  Also hyponatremic at 130.  At this point patient needs to be admitted to the hospital for an AKI, hyponatremia and cellulitis.  Admission accepted by Dr. Chipper Herb.  Final Clinical Impression(s) / ED Diagnoses Final diagnoses:  Cellulitis of right wrist    Rx / DC Orders Admit     Saddie Benders, PA-C 02/06/21 1607    Gwyneth Sprout, MD 02/13/21 1548

## 2021-02-06 NOTE — ED Notes (Signed)
Informed pt that she was now NPO.  She has not eaten since 1900.

## 2021-02-06 NOTE — Anesthesia Procedure Notes (Signed)
Procedure Name: Intubation Date/Time: 02/06/2021 10:20 PM Performed by: Jibran Crookshanks T, CRNA Pre-anesthesia Checklist: Patient identified, Emergency Drugs available, Suction available and Patient being monitored Patient Re-evaluated:Patient Re-evaluated prior to induction Oxygen Delivery Method: Circle system utilized Preoxygenation: Pre-oxygenation with 100% oxygen Induction Type: IV induction Ventilation: Mask ventilation without difficulty Laryngoscope Size: Mac and 3 Grade View: Grade I Tube type: Oral Tube size: 7.0 mm Number of attempts: 1 Airway Equipment and Method: Stylet Placement Confirmation: ETT inserted through vocal cords under direct vision, positive ETCO2 and breath sounds checked- equal and bilateral Secured at: 21 cm Tube secured with: Tape Dental Injury: Teeth and Oropharynx as per pre-operative assessment

## 2021-02-06 NOTE — ED Notes (Signed)
Pt refused COVID swab

## 2021-02-06 NOTE — Op Note (Signed)
PREOPERATIVE DIAGNOSIS: Right wrist septic arthritis Right hand multiple deep space abscesses  POSTOPERATIVE DIAGNOSIS: Same  ATTENDING SURGEON: Dr. Bradly Bienenstock who scrubbed and present for the entire procedure  ASSISTANT SURGEON: None  ANESTHESIA: General via endotracheal tube  OPERATIVE PROCEDURE: Incision and drainage of wrist deep abscess right wrist volar aspect of forearm-25028 Incision and drainage wrist deep abscess right wrist volar part carpal canal-25028 Incision and drainage right wrist deep abscess dorsum of hand and carpometacarpal joint region-25028 Right wrist arthrotomy with exploration and drainage-25040 Right wrist carpal tunnel [phone]  IMPLANTS: None  EBL: Minimal  RADIOGRAPHIC INTERPRETATION: None  SURGICAL INDICATIONS: Patient is a right-hand-dominant female who was admitted to the internal medicine service with the worsening pain and swelling in the right hand.  Patient did have the MRI completed which showed a significant soft tissue infection and deep space infection within the wrist recommended the patient undergo urgent irrigation and debridement and drainage.  Risk benefits and alternatives discussed in detail with the patient and signed informed consent was obtained.  Risks include but not limited to bleeding infection damage nearby nerves arteries or tendons loss of motion of the wrist and digits incomplete relief of symptoms and need for further surgical invention.  SURGICAL TECHNIQUE: Patient was brought identified in the preoperative holding area marked apart a marker made on the right wrist to indicate correct operative site.  Patient then brought back the operating placed supine on the anesthesia table where the general endotracheal anesthesia was administered.  Patient received antibiotics in the emergency department.  A well-padded tourniquet was then placed on the right brachium and stay with the appropriate drape.  The right upper extremities  then prepped and draped normal sterile fashion.  A timeout was called the correct site was identified the procedure then begun.  Attention was then turned to the right hand and extended carpal tunnel incision was then made at the wrist.  The median nerve was then carefully identified just adjacent to the palmaris longus.  Following this the median nerve was then released proximally distally and going through the antebrachial fascia and through the transverse carpal ligament gross purulence was encountered within the hand wrist and forearm.  Drainage of the deep abscess of the right wrist region was done of the distal radial ulnar joint.  The fluid appeared to be extending out of the radiocarpal joint and a septic arthritis of the right wrist.  Fluid was also accumulated throughout the palmar surface and deep space spaces of the hand.  Drainage was then carried out of the deep spaces within the hand of the abscess formations along the fourth and fifth metacarpal region extending over toward the thumb.  Careful protection of the neurovascular bundle was done throughout.  After drainage volarly and release of the transverse carpal ligament tension was torn dorsally and longitudinal incision made directly over the fourth dorsal compartment.  The third dorsal compartment tendon was then carefully identified the retinaculum was raised over the fourth dorsal compartment allowing for exposure between the second the fourth dorsal compartment.  Joint arthrotomy was then carried out longitudinally.  The patient did have the significant arthrosis within the joint disruption of the scapholunate ligament with the large amount of purulent fluid arising from the joint.  Copious wound irrigation done was of the joint.  3 L of fluid was run through the joint from the dorsal to volar direction.  After thorough irrigation dorsally the joint was left open the retinaculum was loosely reapproximated with  Monocryl suture and the skin was  loosely reapproximated with Prolene suture.  Attention was then turned volarly where the skin was then loosely reapproximated over the median nerve with Prolene suture.  Adaptic dressing a sterile compressive bandage then applied.  The patient tolerated the procedure well was placed in a well-padded volar splint extubated taken recovery room in good condition.  POSTOPERATIVE PLAN: Patient has a significant soft tissue and joint infection.  The patient will require IV antibiotics will be on the internal medicine service.  The patient is going to require repeat irrigation and debridement likely within 48 hours and potentially again on Monday.  We will continue to follow the patient as an inpatient.  The patient has significant disruption of the intercarpal ligaments as well as the distal radial ulnar joint.  The goal first and foremost is to try to rid the patient of the infection.  The patient may require long-term IV antibiotics for the septic arthritis and consultation with infectious disease once the cultures come back.  Deep abscess cultures and tissue cultures were taken.

## 2021-02-06 NOTE — Consult Note (Signed)
Reason for Consult: Right wrist infection Referring Physician: Dr. Vanita Panda is an 70 y.o. female.  HPI: HPI: Christy Higgins is a 70 y.o. female with medical history significant of HTN, IIDM, HLD came with worsening of right wrist and right hand swelling and pain.   Symptoms started last Friday, patient woke up with severe swelling of the right wrist/right hand and neck pain.  Came to ED that day, arthrocentesis of the right wrist was done which showed WBC 1500, crystal negative.  Patient was diagnosed with right hand cellulitis and sent home with pain medication and Bactrim twice daily.  In the last 4 days, patient has been taking antibiotics, ibuprofen for pain, however the right wrist swelling progressed, now involve the whole right hand, and the pain became uncontrolled today.  He had episode of chills at home but no fevers.  He feels numbness of the all 5 fingertips, unable to make fist. No Hx of Gout, no new medication.   ED Course: Temperature 99.2, physical exam showed severe right hand swelling.  WBC 30, blood work showed AKI with creatinine 2.0, bicarb 21, K3.6.   Review of Systems: As per HPI otherwise 14 point review of systems negative.   Past Medical History:  Diagnosis Date   Diabetes mellitus    High cholesterol    Hypertension     Past Surgical History:  Procedure Laterality Date   CESAREAN SECTION     TONSILLECTOMY      Family History  Problem Relation Age of Onset   Hypertension Mother    Diabetes Father    Hypertension Father    Hypertension Sister    Diabetes Sister    Hypertension Sister    Diabetes Sister    Hypertension Sister    Diabetes Sister     Social History:  reports that she has never smoked. She has never used smokeless tobacco. She reports that she does not drink alcohol and does not use drugs.  Allergies: No Known Allergies  Medications: I have reviewed the patient's current medications.  Results for orders placed or performed  during the hospital encounter of 02/06/21 (from the past 48 hour(s))  CBC with Differential     Status: Abnormal   Collection Time: 02/06/21  1:46 PM  Result Value Ref Range   WBC 30.0 (H) 4.0 - 10.5 K/uL   RBC 3.32 (L) 3.87 - 5.11 MIL/uL   Hemoglobin 9.1 (L) 12.0 - 15.0 g/dL   HCT 84.6 (L) 65.9 - 93.5 %   MCV 85.5 80.0 - 100.0 fL   MCH 27.4 26.0 - 34.0 pg   MCHC 32.0 30.0 - 36.0 g/dL   RDW 70.1 77.9 - 39.0 %   Platelets 243 150 - 400 K/uL   nRBC 0.0 0.0 - 0.2 %   Neutrophils Relative % 94 %   Neutro Abs 28.2 (H) 1.7 - 7.7 K/uL   Lymphocytes Relative 4 %   Lymphs Abs 1.2 0.7 - 4.0 K/uL   Monocytes Relative 2 %   Monocytes Absolute 0.6 0.1 - 1.0 K/uL   Eosinophils Relative 0 %   Eosinophils Absolute 0.0 0.0 - 0.5 K/uL   Basophils Relative 0 %   Basophils Absolute 0.0 0.0 - 0.1 K/uL   WBC Morphology See Note     Comment: Toxic Granulation  Vaculated Neutrophils    nRBC 1 (H) 0 /100 WBC   Abs Immature Granulocytes 0.00 0.00 - 0.07 K/uL    Comment: Performed at [facility]  [facility], [address], [place], [place] [address]  Comprehensive metabolic panel     Status: Abnormal   Collection Time: 02/06/21  1:46 PM  Result Value Ref Range   Sodium 130 (L) 135 - 145 mmol/L   Potassium 3.6 3.5 - 5.1 mmol/L   Chloride 99 98 - 111 mmol/L   CO2 21 (L) 22 - 32 mmol/L   Glucose, Bld 71 70 - 99 mg/dL    Comment: Glucose reference range applies only to samples taken after fasting for at least 8 hours.   BUN 30 (H) 8 - 23 mg/dL   Creatinine, Ser 6.04 (H) 0.44 - 1.00 mg/dL   Calcium 8.4 (L) 8.9 - 10.3 mg/dL   Total Protein 6.3 (L) 6.5 - 8.1 g/dL   Albumin 2.3 (L) 3.5 - 5.0 g/dL   AST 26 15 - 41 U/L   ALT 24 0 - 44 U/L   Alkaline Phosphatase 99 38 - 126 U/L   Total Bilirubin 0.5 0.3 - 1.2 mg/dL   GFR, Estimated 27 (L) >60 mL/min    Comment: (NOTE) Calculated using the CKD-EPI Creatinine Equation (2021)    Anion gap 10 5 - 15    Comment: Performed at [facility], [address] [place], [place] [address]  Resp Panel by RT-PCR (Flu A&B, Covid) Nasopharyngeal Swab     Status: None   Collection Time: 02/06/21  2:31 PM   Specimen: Nasopharyngeal Swab; Nasopharyngeal(NP) swabs in vial transport medium  Result Value Ref Range   SARS Coronavirus 2 by RT PCR NEGATIVE NEGATIVE    Comment: (NOTE) SARS-CoV-2 target nucleic acids are NOT DETECTED.  The SARS-CoV-2 RNA is generally detectable in upper respiratory specimens during the acute phase of infection. The lowest concentration of SARS-CoV-2 viral copies this assay can detect is 138 copies/mL. A negative result does not preclude SARS-Cov-2 infection and should not be used as the sole basis for treatment or other patient management decisions. A negative result may occur with  improper specimen collection/handling, submission of specimen other than nasopharyngeal swab, presence of viral mutation(s) within the areas targeted by this assay, and inadequate number of viral copies(<138 copies/mL). A negative result must be combined with clinical observations, patient history, and epidemiological information. The expected result is Negative.  Fact Sheet for Patients:  BloggerCourse.com  Fact Sheet for Healthcare Providers:  SeriousBroker.it  This test is no t yet approved or cleared by the [place] FDA and  has been authorized for detection and/or diagnosis of SARS-CoV-2 by FDA under an Emergency Use Authorization (EUA). This EUA will remain  in effect (meaning this test can be used) for the duration of the COVID-19 declaration under Section 564(b)(1) of the Act, 21 U.S.C.section 360bbb-3(b)(1), unless the authorization is terminated  or revoked sooner.       Influenza A by PCR NEGATIVE NEGATIVE   Influenza B by PCR NEGATIVE NEGATIVE    Comment: (NOTE) The Xpert Xpress SARS-CoV-2/FLU/RSV plus assay is intended as an aid in the diagnosis of influenza from  Nasopharyngeal swab specimens and should not be used as a sole basis for treatment. Nasal washings and aspirates are unacceptable for Xpert Xpress SARS-CoV-2/FLU/RSV testing.  Fact Sheet for Patients: BloggerCourse.com  Fact Sheet for Healthcare Providers: SeriousBroker.it  This test is not yet approved or cleared by the [place] FDA and has been authorized for detection and/or diagnosis of SARS-CoV-2 by FDA under an Emergency Use Authorization (EUA). This EUA will remain in effect (meaning this  test can be used) for the duration of the COVID-19 declaration under Section 564(b)(1) of the Act, 21 U.S.C. section 360bbb-3(b)(1), unless the authorization is terminated or revoked.  Performed at [facility], [address] [place], [place] [address]   Lactic acid, plasma     Status: Abnormal   Collection Time: 02/06/21  2:45 PM  Result Value Ref Range   Lactic Acid, Venous 2.3 (HH) 0.5 - 1.9 mmol/L    Comment: CRITICAL RESULT CALLED TO, READ BACK BY AND VERIFIED WITH:  C. ROWE, RN, 1551, 02/06/21, E. ADEDOKUN. Performed at [facility], [address], [place], [place] [address]   Lactic acid, plasma     Status: None   Collection Time: 02/06/21  3:46 PM  Result Value Ref Range   Lactic Acid, Venous 1.4 0.5 - 1.9 mmol/L    Comment: Performed at [facility], [address] [place], [place] [address]  HIV Antibody (routine testing w rflx)     Status: None   Collection Time: 02/06/21  4:31 PM  Result Value Ref Range   HIV Screen 4th Generation wRfx Non Reactive Non Reactive    Comment: Performed at [facility], [address], [place], [place] [address]  Hemoglobin A1c     Status: Abnormal   Collection Time: 02/06/21  4:31 PM  Result Value Ref Range   Hgb A1c MFr Bld 6.4 (H) 4.8 - 5.6 %    Comment: (NOTE) Pre diabetes:          5.7%-6.4%  Diabetes:              >6.4%  Glycemic control for    <7.0% adults with diabetes    Mean Plasma Glucose 136.98 mg/dL    Comment: Performed at [facility], [address] [place], [place] [address]  CBG monitoring, ED     Status: Abnormal   Collection Time: 02/06/21  6:47 PM  Result Value Ref Range   Glucose-Capillary 112 (H) 70 - 99 mg/dL    Comment: Glucose reference range applies only to samples taken after fasting for at least 8 hours.    MR HAND RIGHT WO CONTRAST  Result Date: 02/06/2021 CLINICAL DATA:  Septic arthritis suspected, hand, no prior imaging EXAM: MRI OF THE RIGHT HAND WITHOUT CONTRAST TECHNIQUE: Multiplanar, multisequence MR imaging of the right hand was performed. No intravenous contrast was administered. COMPARISON:  02/03/2021 radiograph FINDINGS: Bones/Joint/Cartilage There is severe radiocarpal arthritis with subchondral cystic change and bony productive change at the DRUJ. There is scapholunate dissociation with mild proximal migration of the capitate. There is bony edema signal within the lunate and distal radius, likely reactive from arthritis. There is no definitive osteomyelitis. Mild radiocarpal, DRUJ, and intercarpal effusion. Ligaments Torn scapholunate ligament. Mild subluxation of the ECU tendon compatible with ECU subsheath tear. Muscles/Tendons/Soft tissues There is diffuse flexor tenosynovitis in the hand and wrist, with distention of the ulnar and radial bursa in the palm and extending proximally into the forearm (for perspective see coronal STIR image 13 for proximal-distal extent). There is a focal fluid collection deep to the second digit flexor tendons measuring 1.5 x 3.0 cm short axis and 2.6 cm in length (axial T2 image 24, coronal stir image 13). There is a fluid collection between the fourth and fifth metacarpals measuring 1.2 x 1.0 cm axially and 3.3 cm in length (axial T2 image 24, coronal stir image 9). There are also more superficial fluid collections  along the dorsum of the hand and tracking along  the ulnar aspect of the wrist superficially. Ulnar-sided more superficial collection measures approximately 3.5 x 1.4 cm axially (axial T2 image 32). Another collection courses along the are superficial aspect of the distal ulna medially measuring 2.6 x 1.3 cm (axial T2 image 40). Diffuse soft tissue swelling of the hand. There is mild tenosynovial joint distension of multiple extensor compartments, most notable of extensor compartments 2, 3, 4, and 5 at the level of the distal radius (axial T2 image 40). IMPRESSION: Severe soft tissue infection of the hand and wrist with multifocal superficial and deep fluid collections, likely abscesses, and with diffuse flexor tenosynovitis as described above. Milder tenosynovitis of extensor compartments 2, 3, 4, and 5 at the level of the distal radius. 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 Bony edema signal within the carpus and distal radius is favored to be related to patient's pre-existing wrist arthritis, though osteomyelitis would be difficult to exclude given the severity of the soft tissue infection. These results were called by telephone at the time of interpretation on 02/06/2021 at 9:10 pm to provider [facility] , who verbally acknowledged these results. Electronically Signed   By: Caprice Renshaw M.D.   On: 02/06/2021 21:13   US RENAL  Result Date: 02/06/2021 CLINICAL DATA:  Acute renal injury. EXAM: RENAL / URINARY TRACT ULTRASOUND COMPLETE COMPARISON:  None. FINDINGS: Right Kidney: Renal measurements: 9.9 x 4.2 x 4.0 cm = volume: 86.2 mL. Echogenicity within normal limits. No mass or hydronephrosis visualized. Left Kidney: Renal measurements: 9.0 x 4.8 x 3.8 cm = volume: 85.34 mL. Echogenicity within normal limits. No mass or hydronephrosis visualized. Bladder: Appears normal for degree of bladder distention. Other: None. IMPRESSION: Normal renal ultrasound. Electronically Signed   By: Darliss Cheney M.D.   On:  02/06/2021 18:03     Blood pressure 102/87, pulse 64, temperature 99.1 F (37.3 C), temperature source Oral, resp. rate 18, SpO2 98 %. Physical Exam  General Appearance:  Alert, cooperative, no distress, appears stated age  Head:  Normocephalic, without obvious abnormality, atraumatic  Eyes:  Pupils equal, conjunctiva/corneas clear,         Throat: Lips, mucosa, and tongue normal; teeth and gums normal  Neck: No visible masses     Lungs:   respirations unlabored  Chest Wall:  No tenderness or deformity  Heart:  Regular rate and rhythm,  Abdomen:   Soft, non-tender,         Extremities: RUE: +LARGE AMT OF SWELLING OVER DORSUM OF HAND AND WRIST, LIMITED DIGITAL MOTION FINGERS WARM WELL PERFUSED  Pulses: 2+ and symmetric  Skin: Skin color, texture, turgor normal, no rashes or lesions     Neurologic: Normal     Assessment/Plan: Right hand and wrist deep space infection  Right hand and wrist incision and drainage and irrigation and debridement as indicated  Emergent procedure to be done now R/B/A DISCUSSED WITH PT IN Hospital  PT VOICED UNDERSTANDING OF PLAN CONSENT SIGNED DAY OF SURGERY PT SEEN AND EXAMINED PRIOR TO OPERATIVE PROCEDURE/DAY OF SURGERY SITE MARKED. QUESTIONS ANSWERED WILL Remain an inpatient FOLLOWING SURGERY  WE ARE PLANNING SURGERY FOR YOUR UPPER EXTREMITY. THE RISKS AND BENEFITS OF SURGERY INCLUDE BUT NOT LIMITED TO BLEEDING INFECTION, DAMAGE TO NEARBY NERVES ARTERIES TENDONS, FAILURE OF SURGERY TO ACCOMPLISH ITS INTENDED GOALS, PERSISTENT SYMPTOMS AND NEED FOR FURTHER SURGICAL INTERVENTION. WITH THIS IN MIND WE WILL PROCEED. I HAVE DISCUSSED WITH  THE PATIENT THE PRE AND POSTOPERATIVE REGIMEN AND THE DOS AND DON'TS. PT VOICED UNDERSTANDING AND INFORMED CONSENT SIGNED.    Sharma Covert 02/06/2021, 9:21 PM

## 2021-02-06 NOTE — Transfer of Care (Signed)
Immediate Anesthesia Transfer of Care Note  Patient: Christy Higgins  Procedure(s) Performed: IRRIGATION AND DEBRIDEMENT RIGHT HAND (Right)  Patient Location: PACU  Anesthesia Type:General  Level of Consciousness: awake, alert  and oriented  Airway & Oxygen Therapy: Patient Spontanous Breathing and Patient connected to nasal cannula oxygen  Post-op Assessment: Report given to RN, Post -op Vital signs reviewed and stable and Patient moving all extremities  Post vital signs: Reviewed and stable  Last Vitals:  Vitals Value Taken Time  BP 135/73 02/06/21 2330  Temp    Pulse 61 02/06/21 2333  Resp 24 02/06/21 2333  SpO2 70 % 02/06/21 2333  Vitals shown include unvalidated device data.  Last Pain:  Vitals:   02/06/21 1931  TempSrc:   PainSc: 10-Worst pain ever         Complications: No notable events documented.

## 2021-02-06 NOTE — Anesthesia Preprocedure Evaluation (Addendum)
Anesthesia Evaluation  Patient identified by MRN, date of birth, ID band Patient awake    Reviewed: Allergy & Precautions, NPO status , Patient's Chart, lab work & pertinent test results  Airway Mallampati: II  TM Distance: >3 FB Neck ROM: Full    Dental  (+) Dental Advisory Given, Upper Dentures, Lower Dentures   Pulmonary neg pulmonary ROS,    Pulmonary exam normal breath sounds clear to auscultation       Cardiovascular hypertension, Pt. on medications Normal cardiovascular exam Rhythm:Regular Rate:Normal     Neuro/Psych negative neurological ROS  negative psych ROS   GI/Hepatic negative GI ROS, Neg liver ROS,   Endo/Other  diabetes, Type 2, Insulin Dependent  Renal/GU Renal disease (AKI)     Musculoskeletal  (+) Arthritis , Right hand infection   Abdominal   Peds  Hematology  (+) Blood dyscrasia, anemia ,   Anesthesia Other Findings Day of surgery medications reviewed with the patient.  Reproductive/Obstetrics                            Anesthesia Physical Anesthesia Plan  ASA: 3 and emergent  Anesthesia Plan: General   Post-op Pain Management:    Induction: Intravenous and Rapid sequence  PONV Risk Score and Plan: 3 and Dexamethasone and Ondansetron  Airway Management Planned: Oral ETT  Additional Equipment:   Intra-op Plan:   Post-operative Plan: Extubation in OR  Informed Consent: I have reviewed the patients History and Physical, chart, labs and discussed the procedure including the risks, benefits and alternatives for the proposed anesthesia with the patient or authorized representative who has indicated his/her understanding and acceptance.     Dental advisory given  Plan Discussed with: CRNA  Anesthesia Plan Comments:         Anesthesia Quick Evaluation

## 2021-02-07 ENCOUNTER — Encounter (HOSPITAL_COMMUNITY): Payer: Self-pay | Admitting: Orthopedic Surgery

## 2021-02-07 DIAGNOSIS — D72829 Elevated white blood cell count, unspecified: Secondary | ICD-10-CM | POA: Diagnosis not present

## 2021-02-07 DIAGNOSIS — L02511 Cutaneous abscess of right hand: Secondary | ICD-10-CM | POA: Diagnosis not present

## 2021-02-07 DIAGNOSIS — M009 Pyogenic arthritis, unspecified: Secondary | ICD-10-CM | POA: Diagnosis not present

## 2021-02-07 LAB — BASIC METABOLIC PANEL
Anion gap: 8 (ref 5–15)
BUN: 22 mg/dL (ref 8–23)
CO2: 20 mmol/L — ABNORMAL LOW (ref 22–32)
Calcium: 8 mg/dL — ABNORMAL LOW (ref 8.9–10.3)
Chloride: 106 mmol/L (ref 98–111)
Creatinine, Ser: 1.42 mg/dL — ABNORMAL HIGH (ref 0.44–1.00)
GFR, Estimated: 40 mL/min — ABNORMAL LOW (ref >60)
Glucose, Bld: 112 mg/dL — ABNORMAL HIGH (ref 70–99)
Potassium: 4.2 mmol/L (ref 3.5–5.1)
Sodium: 134 mmol/L — ABNORMAL LOW (ref 135–145)

## 2021-02-07 LAB — CBC
HCT: 25.7 % — ABNORMAL LOW (ref 36.0–46.0)
Hemoglobin: 8.4 g/dL — ABNORMAL LOW (ref 12.0–15.0)
MCH: 27.5 pg (ref 26.0–34.0)
MCHC: 32.7 g/dL (ref 30.0–36.0)
MCV: 84 fL (ref 80.0–100.0)
Platelets: 261 10*3/uL (ref 150–400)
RBC: 3.06 MIL/uL — ABNORMAL LOW (ref 3.87–5.11)
RDW: 14.6 % (ref 11.5–15.5)
WBC: 27.6 10*3/uL — ABNORMAL HIGH (ref 4.0–10.5)
nRBC: 0 % (ref 0.0–0.2)

## 2021-02-07 LAB — GLUCOSE, CAPILLARY
Glucose-Capillary: 140 mg/dL — ABNORMAL HIGH (ref 70–99)
Glucose-Capillary: 90 mg/dL (ref 70–99)
Glucose-Capillary: 91 mg/dL (ref 70–99)
Glucose-Capillary: 92 mg/dL (ref 70–99)

## 2021-02-07 LAB — VANCOMYCIN, RANDOM: Vancomycin Rm: 6

## 2021-02-07 MED ORDER — VANCOMYCIN HCL 750 MG/150ML IV SOLN: 750.0000 mg | INTRAVENOUS | Status: DC

## 2021-02-07 MED ORDER — VANCOMYCIN HCL IN DEXTROSE 1-5 GM/200ML-% IV SOLN
1000.0000 mg | INTRAVENOUS | Status: DC
  Administered 2021-02-07: 1000 mg via INTRAVENOUS
  Filled 2021-02-07 (×2): qty 200

## 2021-02-07 MED ORDER — SENNOSIDES-DOCUSATE SODIUM 8.6-50 MG PO TABS
1.0000 | ORAL_TABLET | Freq: Two times a day (BID) | ORAL | Status: DC
  Administered 2021-02-07 – 2021-02-12 (×3): 1 via ORAL
  Filled 2021-02-07 (×7): qty 1

## 2021-02-07 MED ORDER — BISACODYL 10 MG RE SUPP: 10.0000 mg | Freq: Every day | RECTAL | Status: DC | PRN

## 2021-02-07 MED ORDER — POLYETHYLENE GLYCOL 3350 17 G PO PACK: 17.0000 g | PACK | Freq: Every day | ORAL | Status: DC | PRN

## 2021-02-07 NOTE — Progress Notes (Signed)
Pacu RN Report to floor given  Gave report to [facility] RN. [phone]. Discussed surgery, meds given in OR and Pacu, VS, IV fluids given, EBL, urine output, pain and other pertinent information. Also discussed if pt had any family or friends here or belongings with them.   Pt has her clothes, purse, phone and dentures. Her husband went home and will return in the AM. Called Aisha on 6N to let [place] RN know.   Pt exits my care.

## 2021-02-07 NOTE — Anesthesia Postprocedure Evaluation (Signed)
Anesthesia Post Note  Patient: Christy Higgins  Procedure(s) Performed: IRRIGATION AND DEBRIDEMENT RIGHT HAND (Right)     Patient location during evaluation: PACU Anesthesia Type: General Level of consciousness: awake and alert Pain management: pain level controlled Vital Signs Assessment: post-procedure vital signs reviewed and stable Respiratory status: spontaneous breathing, nonlabored ventilation and respiratory function stable Cardiovascular status: blood pressure returned to baseline and stable Postop Assessment: no apparent nausea or vomiting Anesthetic complications: no   No notable events documented.  Last Vitals:  Vitals:   02/07/21 0000 02/07/21 0005  BP: (!) 133/55   Pulse: 85 79  Resp: (!) 21 (!) 22  Temp:  36.4 C  SpO2: 98% 95%    Last Pain:  Vitals:   02/07/21 0005  TempSrc:   PainSc: 4                  Cecile Hearing

## 2021-02-07 NOTE — Progress Notes (Signed)
Pharmacy Antibiotic Note  Christy Higgins is a 70 y.o. female admitted on 10/16 2022 with deep wrist abscess (right) (septic arthritis). Pharmacy has been consulted for vancomycin dosing.  Plan: Vanco based on levels until AKI resolves (baseline 0.67 to 0.81) Vanco 1 gram iv X1 given 10/19 1545 Vanco random level 10/20 1200 resulted as 6 mcg/mL Dose: Vanco 1000 mg iv q24h targeting an AUC of 450 to 550   Temp (24hrs), Avg:98.3 F (36.8 C), Min:97.3 F (36.3 C), Max:99.1 F (37.3 C)  Recent Labs  Lab 02/03/21 1655 02/06/21 1346 02/06/21 1445 02/06/21 1546 02/07/21 0210 02/07/21 1134  WBC 18.4* 30.0*  --   --  27.6*  --   CREATININE 0.81 2.00*  --   --  1.42*  --   LATICACIDVEN 1.3  --  2.3* 1.4  --   --   VANCORANDOM  --   --   --   --   --  6    Estimated Creatinine Clearance: 30.8 mL/min (A) (by C-G formula based on SCr of 1.42 mg/dL (H)).    No Known Allergies   Microbiology results: 10/19 soft tissue (right wrist tissue specimen): collected 10/19 @ 22:49- Gram's stain shows few squamous epi cells, WBC's, and no organisms. Culture is pending  10/20 BCx: 02/07/2021 ordered- collected 08:29    Thank you for allowing pharmacy to be a part of this patient's care.  Kyson Kupper J Mai Longnecker BS, PhamD, BCPS 02/07/2021 8:13 AM

## 2021-02-07 NOTE — Progress Notes (Signed)
Received phone call from Dr. Melvyn Novas checking in on patient. MD stated patient will have surgery(incision and debridement of right wrist) tomorrow after 4pm. Dr. Yehuda Budd will be doing the surgery.Received orders for patient to be NPO tomorrow after 8am. Pt made aware regarding the surgery tomorrow.

## 2021-02-07 NOTE — Progress Notes (Signed)
Patient ID: Christy Higgins, female   DOB: 11/16/1950, 70 y.o.   MRN: [id number]  PROGRESS NOTE    Daviona Herbert  OIZ:[id number] DOB: 1950-11-24 DOA: 02/06/2021 PCP: Lindaann Pascal, PA-C   Brief Narrative:   70 y.o. female with medical history significant of HTN, diabetes mellitus type 2, HLD presented with right hand swelling and pain not improving with outpatient Bactrim.  On presentation, temperature was 99.2 with severe right hand swelling.  WBCs of 30 with creatinine of 2.0.  She was started on broad-spectrum antibiotics.  Hand surgery was consulted.  She underwent surgical intervention on 02/06/2021.  Assessment & Plan:   Sepsis: Present on admission Right wrist septic arthritis and right hand multiple deep space abscesses -Presented with worsening right hand pain and swelling with elevated WBCs, lactic acid and right wrist septic arthritis with multiple deep space abscesses as evidenced by MRI of right hand -Status post I&D of the right hand and wrist on 02/06/2021.  Wound care as per hand surgery recommendations.  Follow or cultures. -We will check blood cultures as well -Continue Rocephin.  Add vancomycin.  Leukocytosis -Still significant but improving.  Anemia of chronic disease -Questionable cause.  Hemoglobin stable.  No signs of bleeding.  Monitor  Hyponatremia -Possibly from dehydration.  Improving.  IV fluids as below  Acute kidney injury -Presented with creatinine of 2.  Treated with IV fluids.  Creatinine 1.42.  Change normal saline to 75 cc an hour  Diabetes mellitus type II -Metformin on hold.  Continue CBGs with SSI  Hypertension -Blood pressure stable.  Monitor   DVT prophylaxis: None.  SCDs Code Status: Full Family Communication: Husband at bedside Disposition Plan: Status is: Inpatient  Remains inpatient appropriate because: Needs IV antibiotics.  Still has significant leukocytosis   Consultants: Hand surgery  Procedures: Right hand and wrist I&D on  02/06/2021  Antimicrobials:  Anti-infectives (From admission, onward)    Start     Dose/Rate Route Frequency Ordered Stop   02/06/21 1700  cefTRIAXone (ROCEPHIN) 2 g in sodium chloride 0.9 % 100 mL IVPB        2 g 200 mL/hr over 30 Minutes Intravenous Every 24 hours 02/06/21 1651     02/06/21 1545  vancomycin (VANCOCIN) IVPB 1000 mg/200 mL premix        1,000 mg 200 mL/hr over 60 Minutes Intravenous  Once 02/06/21 1538 02/06/21 1959        Subjective: Patient seen and examined at bedside.  Feels slightly better.  Pain is improving.  No fever, nausea, vomiting or worsening shortness of breath reported.  Objective: Vitals:   02/07/21 0015 02/07/21 0036 02/07/21 0440 02/07/21 0900  BP:  118/63 135/68   Pulse: 84 72 (!) 53   Resp: (!) 22 17 20    Temp:  99 F (37.2 C) 98.5 F (36.9 C)   TempSrc:  Oral Oral   SpO2: 98% 98% 98%   Weight:    62.2 kg  Height:    5' (1.524 m)    Intake/Output Summary (Last 24 hours) at 02/07/2021 1042 Last data filed at 02/07/2021 0300 Gross per 24 hour  Intake 2014.26 ml  Output 560 ml  Net 1454.26 ml   Filed Weights   02/07/21 0900  Weight: 62.2 kg    Examination:  General exam: Appears calm and comfortable.  Currently on room air. Respiratory system: Bilateral decreased breath sounds at bases Cardiovascular system: S1 & S2 heard, Rate controlled Gastrointestinal system: Abdomen is nondistended, soft and nontender.  Normal bowel sounds heard. Extremities: No cyanosis, clubbing, edema.  Right hand dressing present Central nervous system: Alert and oriented. No focal neurological deficits. Moving extremities Skin: No other ecchymosis/obvious lesions noted  psychiatry: Judgement and insight appear normal. Mood & affect appropriate.     Data Reviewed: I have personally reviewed following labs and imaging studies  CBC: Recent Labs  Lab 02/03/21 1655 02/06/21 1346 02/07/21 0210  WBC 18.4* 30.0* 27.6*  NEUTROABS 15.3* 28.2*  --    HGB 10.9* 9.1* 8.4*  HCT 34.4* 28.4* 25.7*  MCV 86.9 85.5 84.0  PLT 219 243 261   Basic Metabolic Panel: Recent Labs  Lab 02/03/21 1655 02/06/21 1346 02/07/21 0210  NA 136 130* 134*  K 3.9 3.6 4.2  CL 101 99 106  CO2 24 21* 20*  GLUCOSE 101* 71 112*  BUN 17 30* 22  CREATININE 0.81 2.00* 1.42*  CALCIUM 9.1 8.4* 8.0*   GFR: Estimated Creatinine Clearance: 30.8 mL/min (A) (by C-G formula based on SCr of 1.42 mg/dL (H)). Liver Function Tests: Recent Labs  Lab 02/03/21 1655 02/06/21 1346  AST 41 26  ALT 37 24  ALKPHOS 90 99  BILITOT 1.1 0.5  PROT 7.7 6.3*  ALBUMIN 2.9* 2.3*   No results for input(s): LIPASE, AMYLASE in the last 168 hours. No results for input(s): AMMONIA in the last 168 hours. Coagulation Profile: No results for input(s): INR, PROTIME in the last 168 hours. Cardiac Enzymes: No results for input(s): CKTOTAL, CKMB, CKMBINDEX, TROPONINI in the last 168 hours. BNP (last 3 results) No results for input(s): PROBNP in the last 8760 hours. HbA1C: Recent Labs    02/06/21 1631  HGBA1C 6.4*   CBG: Recent Labs  Lab 02/06/21 1847 02/06/21 2353 02/07/21 0811  GLUCAP 112* 87 140*   Lipid Profile: No results for input(s): CHOL, HDL, LDLCALC, TRIG, CHOLHDL, LDLDIRECT in the last 72 hours. Thyroid Function Tests: No results for input(s): TSH, T4TOTAL, FREET4, T3FREE, THYROIDAB in the last 72 hours. Anemia Panel: No results for input(s): VITAMINB12, FOLATE, FERRITIN, TIBC, IRON, RETICCTPCT in the last 72 hours. Sepsis Labs: Recent Labs  Lab 02/03/21 1655 02/06/21 1445 02/06/21 1546  LATICACIDVEN 1.3 2.3* 1.4    Recent Results (from the past 240 hour(s))  Resp Panel by RT-PCR (Flu A&B, Covid) Nasopharyngeal Swab     Status: None   Collection Time: 02/04/21  1:42 AM   Specimen: Nasopharyngeal Swab; Nasopharyngeal(NP) swabs in vial transport medium  Result Value Ref Range Status   SARS Coronavirus 2 by RT PCR NEGATIVE NEGATIVE Final    Comment:  (NOTE) SARS-CoV-2 target nucleic acids are NOT DETECTED.  The SARS-CoV-2 RNA is generally detectable in upper respiratory specimens during the acute phase of infection. The lowest concentration of SARS-CoV-2 viral copies this assay can detect is 138 copies/mL. A negative result does not preclude SARS-Cov-2 infection and should not be used as the sole basis for treatment or other patient management decisions. A negative result may occur with  improper specimen collection/handling, submission of specimen other than nasopharyngeal swab, presence of viral mutation(s) within the areas targeted by this assay, and inadequate number of viral copies(<138 copies/mL). A negative result must be combined with clinical observations, patient history, and epidemiological information. The expected result is Negative.  Fact Sheet for Patients:  BloggerCourse.com  Fact Sheet for Healthcare Providers:  SeriousBroker.it  This test is no t yet approved or cleared by the [place] FDA and  has been authorized for detection and/or diagnosis of  SARS-CoV-2 by FDA under an Emergency Use Authorization (EUA). This EUA will remain  in effect (meaning this test can be used) for the duration of the COVID-19 declaration under Section 564(b)(1) of the Act, 21 U.S.C.section 360bbb-3(b)(1), unless the authorization is terminated  or revoked sooner.       Influenza A by PCR NEGATIVE NEGATIVE Final   Influenza B by PCR NEGATIVE NEGATIVE Final    Comment: (NOTE) The Xpert Xpress SARS-CoV-2/FLU/RSV plus assay is intended as an aid in the diagnosis of influenza from Nasopharyngeal swab specimens and should not be used as a sole basis for treatment. Nasal washings and aspirates are unacceptable for Xpert Xpress SARS-CoV-2/FLU/RSV testing.  Fact Sheet for Patients: BloggerCourse.com  Fact Sheet for Healthcare  Providers: SeriousBroker.it  This test is not yet approved or cleared by the [place] FDA and has been authorized for detection and/or diagnosis of SARS-CoV-2 by FDA under an Emergency Use Authorization (EUA). This EUA will remain in effect (meaning this test can be used) for the duration of the COVID-19 declaration under Section 564(b)(1) of the Act, 21 U.S.C. section 360bbb-3(b)(1), unless the authorization is terminated or revoked.  Performed at [facility], [address] [place], [place] [address]   Resp Panel by RT-PCR (Flu A&B, Covid) Nasopharyngeal Swab     Status: None   Collection Time: 02/06/21  2:31 PM   Specimen: Nasopharyngeal Swab; Nasopharyngeal(NP) swabs in vial transport medium  Result Value Ref Range Status   SARS Coronavirus 2 by RT PCR NEGATIVE NEGATIVE Final    Comment: (NOTE) SARS-CoV-2 target nucleic acids are NOT DETECTED.  The SARS-CoV-2 RNA is generally detectable in upper respiratory specimens during the acute phase of infection. The lowest concentration of SARS-CoV-2 viral copies this assay can detect is 138 copies/mL. A negative result does not preclude SARS-Cov-2 infection and should not be used as the sole basis for treatment or other patient management decisions. A negative result may occur with  improper specimen collection/handling, submission of specimen other than nasopharyngeal swab, presence of viral mutation(s) within the areas targeted by this assay, and inadequate number of viral copies(<138 copies/mL). A negative result must be combined with clinical observations, patient history, and epidemiological information. The expected result is Negative.  Fact Sheet for Patients:  BloggerCourse.com  Fact Sheet for Healthcare Providers:  SeriousBroker.it  This test is no t yet approved or cleared by the [place] FDA and  has been authorized for  detection and/or diagnosis of SARS-CoV-2 by FDA under an Emergency Use Authorization (EUA). This EUA will remain  in effect (meaning this test can be used) for the duration of the COVID-19 declaration under Section 564(b)(1) of the Act, 21 U.S.C.section 360bbb-3(b)(1), unless the authorization is terminated  or revoked sooner.       Influenza A by PCR NEGATIVE NEGATIVE Final   Influenza B by PCR NEGATIVE NEGATIVE Final    Comment: (NOTE) The Xpert Xpress SARS-CoV-2/FLU/RSV plus assay is intended as an aid in the diagnosis of influenza from Nasopharyngeal swab specimens and should not be used as a sole basis for treatment. Nasal washings and aspirates are unacceptable for Xpert Xpress SARS-CoV-2/FLU/RSV testing.  Fact Sheet for Patients: BloggerCourse.com  Fact Sheet for Healthcare Providers: SeriousBroker.it  This test is not yet approved or cleared by the [place] FDA and has been authorized for detection and/or diagnosis of SARS-CoV-2 by FDA under an Emergency Use Authorization (EUA). This EUA will remain in effect (meaning this test can be used)  for the duration of the COVID-19 declaration under Section 564(b)(1) of the Act, 21 U.S.C. section 360bbb-3(b)(1), unless the authorization is terminated or revoked.  Performed at [facility], [address], [place], [place] [address]   Aerobic/Anaerobic Culture w Gram Stain (surgical/deep wound)     Status: None (Preliminary result)   Collection Time: 02/06/21 10:35 PM   Specimen: Wound  Result Value Ref Range Status   Specimen Description WOUND  Final   Special Requests RIGHT WRIST WOUND SPEC A  Final   Gram Stain   Final    FEW SQUAMOUS EPITHELIAL CELLS PRESENT MODERATE WBC SEEN FEW GRAM POSITIVE COCCI Performed at [facility], [address], [place], [place] [address]    Culture PENDING  Incomplete   Report Status PENDING  Incomplete   Aerobic/Anaerobic Culture w Gram Stain (surgical/deep wound)     Status: None (Preliminary result)   Collection Time: 02/06/21 10:49 PM   Specimen: PATH Soft tissue  Result Value Ref Range Status   Specimen Description TISSUE  Final   Special Requests RIGHT WOUND TISSUE SPEC B  Final   Gram Stain   Final    FEW SQUAMOUS EPITHELIAL CELLS PRESENT FEW WBC SEEN NO ORGANISMS SEEN Performed at [facility], [address] [place], [place] [address]    Culture PENDING  Incomplete   Report Status PENDING  Incomplete         Radiology Studies: MR HAND RIGHT WO CONTRAST  Result Date: 02/06/2021 CLINICAL DATA:  Septic arthritis suspected, hand, no prior imaging EXAM: MRI OF THE RIGHT HAND WITHOUT CONTRAST TECHNIQUE: Multiplanar, multisequence MR imaging of the right hand was performed. No intravenous contrast was administered. COMPARISON:  02/03/2021 radiograph FINDINGS: Bones/Joint/Cartilage There is severe radiocarpal arthritis with subchondral cystic change and bony productive change at the DRUJ. There is scapholunate dissociation with mild proximal migration of the capitate. There is bony edema signal within the lunate and distal radius, likely reactive from arthritis. There is no definitive osteomyelitis. Mild radiocarpal, DRUJ, and intercarpal effusion. Ligaments Torn scapholunate ligament. Mild subluxation of the ECU tendon compatible with ECU subsheath tear. Muscles/Tendons/Soft tissues There is diffuse flexor tenosynovitis in the hand and wrist, with distention of the ulnar and radial bursa in the palm and extending proximally into the forearm (for perspective see coronal STIR image 13 for proximal-distal extent). There is a focal fluid collection deep to the second digit flexor tendons measuring 1.5 x 3.0 cm short axis and 2.6 cm in length (axial T2 image 24, coronal stir image 13). There is a fluid collection between the fourth and fifth metacarpals measuring 1.2 x 1.0 cm axially and  3.3 cm in length (axial T2 image 24, coronal stir image 9). There are also more superficial fluid collections along the dorsum of the hand and tracking along the ulnar aspect of the wrist superficially. Ulnar-sided more superficial collection measures approximately 3.5 x 1.4 cm axially (axial T2 image 32). Another collection courses along the are superficial aspect of the distal ulna medially measuring 2.6 x 1.3 cm (axial T2 image 40). Diffuse soft tissue swelling of the hand. There is mild tenosynovial joint distension of multiple extensor compartments, most notable of extensor compartments 2, 3, 4, and 5 at the level of the distal radius (axial T2 image 40). IMPRESSION: Severe soft tissue infection of the hand and wrist with multifocal superficial and deep fluid collections, likely abscesses, and with diffuse flexor tenosynovitis as described above. Milder tenosynovitis of extensor  compartments 2, 3, 4, and 5 at the level of the distal radius. 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 Bony edema signal within the carpus and distal radius is favored to be related to patient's pre-existing wrist arthritis, though osteomyelitis would be difficult to exclude given the severity of the soft tissue infection. These results were called by telephone at the time of interpretation on 02/06/2021 at 9:10 pm to provider [facility] , who verbally acknowledged these results. Electronically Signed   By: Caprice Renshaw M.D.   On: 02/06/2021 21:13   US RENAL  Result Date: 02/06/2021 CLINICAL DATA:  Acute renal injury. EXAM: RENAL / URINARY TRACT ULTRASOUND COMPLETE COMPARISON:  None. FINDINGS: Right Kidney: Renal measurements: 9.9 x 4.2 x 4.0 cm = volume: 86.2 mL. Echogenicity within normal limits. No mass or hydronephrosis visualized. Left Kidney: Renal measurements: 9.0 x 4.8 x 3.8 cm = volume: 85.34 mL. Echogenicity within normal limits. No mass or hydronephrosis visualized. Bladder:  Appears normal for degree of bladder distention. Other: None. IMPRESSION: Normal renal ultrasound. Electronically Signed   By: Darliss Cheney M.D.   On: 02/06/2021 18:03        Scheduled Meds:  aspirin EC  81 mg Oral Daily   fentaNYL       insulin aspart  0-9 Units Subcutaneous TID WC   Continuous Infusions:  sodium chloride 125 mL/hr at 02/07/21 0050   cefTRIAXone (ROCEPHIN)  IV Stopped (02/06/21 1959)          Glade Lloyd, MD [facility] 02/07/2021, 10:42 AM

## 2021-02-08 ENCOUNTER — Inpatient Hospital Stay (HOSPITAL_COMMUNITY): Payer: BC Managed Care – PPO | Admitting: Certified Registered"

## 2021-02-08 ENCOUNTER — Encounter (HOSPITAL_COMMUNITY)
Admission: EM | Disposition: A | Discharge status: Home / Self Care | Payer: Self-pay | Source: Home / Self Care | Attending: Internal Medicine

## 2021-02-08 ENCOUNTER — Encounter (HOSPITAL_COMMUNITY): Payer: Self-pay | Admitting: Internal Medicine

## 2021-02-08 DIAGNOSIS — M009 Pyogenic arthritis, unspecified: Secondary | ICD-10-CM | POA: Diagnosis not present

## 2021-02-08 HISTORY — PX: I & D EXTREMITY: SHX5045

## 2021-02-08 LAB — CBC WITH DIFFERENTIAL/PLATELET
Abs Immature Granulocytes: 0.39 10*3/uL — ABNORMAL HIGH (ref 0.00–0.07)
Basophils Absolute: 0 10*3/uL (ref 0.0–0.1)
Basophils Relative: 0 %
Eosinophils Absolute: 0.1 10*3/uL (ref 0.0–0.5)
Eosinophils Relative: 1 %
HCT: 26.1 % — ABNORMAL LOW (ref 36.0–46.0)
Hemoglobin: 8.6 g/dL — ABNORMAL LOW (ref 12.0–15.0)
Immature Granulocytes: 2 %
Lymphocytes Relative: 13 %
Lymphs Abs: 2.3 10*3/uL (ref 0.7–4.0)
MCH: 27.6 pg (ref 26.0–34.0)
MCHC: 33 g/dL (ref 30.0–36.0)
MCV: 83.7 fL (ref 80.0–100.0)
Monocytes Absolute: 0.9 10*3/uL (ref 0.1–1.0)
Monocytes Relative: 5 %
Neutro Abs: 13.9 10*3/uL — ABNORMAL HIGH (ref 1.7–7.7)
Neutrophils Relative %: 79 %
Platelets: 276 10*3/uL (ref 150–400)
RBC: 3.12 MIL/uL — ABNORMAL LOW (ref 3.87–5.11)
RDW: 14.8 % (ref 11.5–15.5)
WBC: 17.7 10*3/uL — ABNORMAL HIGH (ref 4.0–10.5)
nRBC: 0 % (ref 0.0–0.2)

## 2021-02-08 LAB — BASIC METABOLIC PANEL
Anion gap: 7 (ref 5–15)
BUN: 15 mg/dL (ref 8–23)
CO2: 22 mmol/L (ref 22–32)
Calcium: 8.4 mg/dL — ABNORMAL LOW (ref 8.9–10.3)
Chloride: 109 mmol/L (ref 98–111)
Creatinine, Ser: 0.94 mg/dL (ref 0.44–1.00)
GFR, Estimated: >60 mL/min (ref >60)
Glucose, Bld: 88 mg/dL (ref 70–99)
Potassium: 4.4 mmol/L (ref 3.5–5.1)
Sodium: 138 mmol/L (ref 135–145)

## 2021-02-08 LAB — GLUCOSE, CAPILLARY
Glucose-Capillary: 104 mg/dL — ABNORMAL HIGH (ref 70–99)
Glucose-Capillary: 116 mg/dL — ABNORMAL HIGH (ref 70–99)
Glucose-Capillary: 140 mg/dL — ABNORMAL HIGH (ref 70–99)
Glucose-Capillary: 63 mg/dL — ABNORMAL LOW (ref 70–99)
Glucose-Capillary: 67 mg/dL — ABNORMAL LOW (ref 70–99)
Glucose-Capillary: 74 mg/dL (ref 70–99)
Glucose-Capillary: 80 mg/dL (ref 70–99)
Glucose-Capillary: 85 mg/dL (ref 70–99)
Glucose-Capillary: 93 mg/dL (ref 70–99)

## 2021-02-08 LAB — SURGICAL PCR SCREEN
MRSA, PCR: NEGATIVE
Staphylococcus aureus: NEGATIVE

## 2021-02-08 LAB — MAGNESIUM: Magnesium: 2 mg/dL (ref 1.7–2.4)

## 2021-02-08 LAB — C-REACTIVE PROTEIN: CRP: 20 mg/dL — ABNORMAL HIGH (ref <1.0)

## 2021-02-08 SURGERY — IRRIGATION AND DEBRIDEMENT EXTREMITY
Anesthesia: General | Site: Hand | Laterality: Right

## 2021-02-08 MED ORDER — DEXTROSE 50 % IV SOLN: 50.0000 mL | Freq: Once | INTRAVENOUS | Status: AC

## 2021-02-08 MED ORDER — CHLORHEXIDINE GLUCONATE 0.12 % MT SOLN: 15.0000 mL | Freq: Once | OROMUCOSAL | Status: AC

## 2021-02-08 MED ORDER — LIDOCAINE 2% (20 MG/ML) 5 ML SYRINGE
INTRAMUSCULAR | Status: DC | PRN
  Administered 2021-02-08: 40 mg via INTRAVENOUS

## 2021-02-08 MED ORDER — SUGAMMADEX SODIUM 200 MG/2ML IV SOLN
INTRAVENOUS | Status: DC | PRN
  Administered 2021-02-08: 200 mg via INTRAVENOUS

## 2021-02-08 MED ORDER — ACETAMINOPHEN 500 MG PO TABS
1000.0000 mg | ORAL_TABLET | Freq: Once | ORAL | Status: AC
  Administered 2021-02-08: 1000 mg via ORAL
  Filled 2021-02-08: qty 2

## 2021-02-08 MED ORDER — LACTATED RINGERS IV SOLN: INTRAVENOUS | Status: DC

## 2021-02-08 MED ORDER — DEXTROSE 50 % IV SOLN
12.5000 g | INTRAVENOUS | Status: AC
  Filled 2021-02-08: qty 50

## 2021-02-08 MED ORDER — VANCOMYCIN HCL 750 MG/150ML IV SOLN
750.0000 mg | INTRAVENOUS | Status: DC
  Administered 2021-02-08 – 2021-02-11 (×4): 750 mg via INTRAVENOUS
  Filled 2021-02-08 (×5): qty 150

## 2021-02-08 MED ORDER — FENTANYL CITRATE (PF) 100 MCG/2ML IJ SOLN
INTRAMUSCULAR | Status: DC | PRN
  Administered 2021-02-08 (×2): 50 ug via INTRAVENOUS

## 2021-02-08 MED ORDER — CELECOXIB 200 MG PO CAPS
200.0000 mg | ORAL_CAPSULE | Freq: Once | ORAL | Status: AC
  Administered 2021-02-08: 200 mg via ORAL
  Filled 2021-02-08: qty 1

## 2021-02-08 MED ORDER — SODIUM CHLORIDE 0.9 % IR SOLN
Status: DC | PRN
  Administered 2021-02-08: 3000 mL

## 2021-02-08 MED ORDER — PROMETHAZINE HCL 25 MG/ML IJ SOLN: 6.2500 mg | INTRAMUSCULAR | Status: DC | PRN

## 2021-02-08 MED ORDER — DEXTROSE 50 % IV SOLN
INTRAVENOUS | Status: AC
  Administered 2021-02-08: 12.5 g via INTRAVENOUS
  Filled 2021-02-08: qty 50

## 2021-02-08 MED ORDER — DEXTROSE 50 % IV SOLN
INTRAVENOUS | Status: AC
  Administered 2021-02-08: 50 mL
  Filled 2021-02-08: qty 50

## 2021-02-08 MED ORDER — FENTANYL CITRATE (PF) 100 MCG/2ML IJ SOLN
25.0000 ug | INTRAMUSCULAR | Status: DC | PRN
  Administered 2021-02-08 (×2): 25 ug via INTRAVENOUS

## 2021-02-08 MED ORDER — AMISULPRIDE (ANTIEMETIC) 5 MG/2ML IV SOLN: 10.0000 mg | Freq: Once | INTRAVENOUS | Status: DC | PRN

## 2021-02-08 MED ORDER — PROPOFOL 10 MG/ML IV BOLUS
INTRAVENOUS | Status: AC
  Filled 2021-02-08: qty 20

## 2021-02-08 MED ORDER — FENTANYL CITRATE (PF) 100 MCG/2ML IJ SOLN
INTRAMUSCULAR | Status: AC
  Filled 2021-02-08: qty 2

## 2021-02-08 MED ORDER — LIDOCAINE HCL (PF) 1 % IJ SOLN
INTRAMUSCULAR | Status: AC
  Filled 2021-02-08: qty 30

## 2021-02-08 MED ORDER — CHLORHEXIDINE GLUCONATE 0.12 % MT SOLN
OROMUCOSAL | Status: AC
  Administered 2021-02-08: 15 mL via OROMUCOSAL
  Filled 2021-02-08: qty 15

## 2021-02-08 MED ORDER — PROPOFOL 10 MG/ML IV BOLUS
INTRAVENOUS | Status: DC | PRN
  Administered 2021-02-08: 50 mg via INTRAVENOUS
  Administered 2021-02-08: 100 mg via INTRAVENOUS

## 2021-02-08 MED ORDER — ORAL CARE MOUTH RINSE: 15.0000 mL | Freq: Once | OROMUCOSAL | Status: AC

## 2021-02-08 MED ORDER — BUPIVACAINE HCL (PF) 0.25 % IJ SOLN
INTRAMUSCULAR | Status: AC
  Filled 2021-02-08: qty 30

## 2021-02-08 MED ORDER — ROCURONIUM BROMIDE 10 MG/ML (PF) SYRINGE
PREFILLED_SYRINGE | INTRAVENOUS | Status: DC | PRN
  Administered 2021-02-08: 30 mg via INTRAVENOUS

## 2021-02-08 MED ORDER — BACITRACIN ZINC 500 UNIT/GM EX OINT
TOPICAL_OINTMENT | CUTANEOUS | Status: AC
  Filled 2021-02-08: qty 28.35

## 2021-02-08 MED ORDER — ONDANSETRON HCL 4 MG/2ML IJ SOLN
INTRAMUSCULAR | Status: DC | PRN
  Administered 2021-02-08: 4 mg via INTRAVENOUS

## 2021-02-08 MED ORDER — FENTANYL CITRATE (PF) 250 MCG/5ML IJ SOLN
INTRAMUSCULAR | Status: AC
  Filled 2021-02-08: qty 5

## 2021-02-08 MED ORDER — SUCCINYLCHOLINE CHLORIDE 200 MG/10ML IV SOSY
PREFILLED_SYRINGE | INTRAVENOUS | Status: DC | PRN
  Administered 2021-02-08: 100 mg via INTRAVENOUS

## 2021-02-08 SURGICAL SUPPLY — 43 items
BAG COUNTER SPONGE SURGICOUNT (BAG) ×2 IMPLANT
BAG SPNG CNTER NS LX DISP (BAG) ×1
BNDG CMPR 9X4 STRL LF SNTH (GAUZE/BANDAGES/DRESSINGS) ×1
BNDG ELASTIC 3X5.8 VLCR STR LF (GAUZE/BANDAGES/DRESSINGS) ×2 IMPLANT
BNDG ELASTIC 4X5.8 VLCR STR LF (GAUZE/BANDAGES/DRESSINGS) ×2 IMPLANT
BNDG ELASTIC 6X5.8 VLCR STR LF (GAUZE/BANDAGES/DRESSINGS) ×1 IMPLANT
BNDG ESMARK 4X9 LF (GAUZE/BANDAGES/DRESSINGS) ×2 IMPLANT
CORD BIPOLAR FORCEPS 12FT (ELECTRODE) ×2 IMPLANT
COVER SURGICAL LIGHT HANDLE (MISCELLANEOUS) ×2 IMPLANT
CUFF TOURN SGL QUICK 18X4 (TOURNIQUET CUFF) ×2 IMPLANT
DRAPE U-SHAPE 47X51 STRL (DRAPES) ×1 IMPLANT
DRSG ADAPTIC 3X8 NADH LF (GAUZE/BANDAGES/DRESSINGS) ×2 IMPLANT
DRSG EMULSION OIL 3X3 NADH (GAUZE/BANDAGES/DRESSINGS) ×2 IMPLANT
ELECT REM PT RETURN 9FT ADLT (ELECTROSURGICAL) ×2
ELECTRODE REM PT RTRN 9FT ADLT (ELECTROSURGICAL) IMPLANT
GAUZE SPONGE 4X4 12PLY STRL (GAUZE/BANDAGES/DRESSINGS) ×2 IMPLANT
GLOVE SURG PR MICRO ENCORE 7.5 (GLOVE) ×2 IMPLANT
GOWN STRL REUS W/ TWL LRG LVL3 (GOWN DISPOSABLE) ×3 IMPLANT
GOWN STRL REUS W/ TWL XL LVL3 (GOWN DISPOSABLE) ×1 IMPLANT
GOWN STRL REUS W/TWL LRG LVL3 (GOWN DISPOSABLE) ×6
GOWN STRL REUS W/TWL XL LVL3 (GOWN DISPOSABLE) ×2
KIT BASIN OR (CUSTOM PROCEDURE TRAY) ×2 IMPLANT
KIT TURNOVER KIT B (KITS) ×2 IMPLANT
MANIFOLD NEPTUNE II (INSTRUMENTS) ×2 IMPLANT
NDL HYPO 25GX1X1/2 BEV (NEEDLE) IMPLANT
NEEDLE HYPO 25GX1X1/2 BEV (NEEDLE) IMPLANT
NS IRRIG 1000ML POUR BTL (IV SOLUTION) ×2 IMPLANT
PACK ORTHO EXTREMITY (CUSTOM PROCEDURE TRAY) ×2 IMPLANT
PAD ARMBOARD 7.5X6 YLW CONV (MISCELLANEOUS) ×4 IMPLANT
PAD CAST 4YDX4 CTTN HI CHSV (CAST SUPPLIES) ×1 IMPLANT
PADDING CAST COTTON 4X4 STRL (CAST SUPPLIES) ×2
PADDING CAST COTTON 6X4 STRL (CAST SUPPLIES) ×1 IMPLANT
SOAP 2 % CHG 4 OZ (WOUND CARE) ×2 IMPLANT
SPONGE T-LAP 18X18 ~~LOC~~+RFID (SPONGE) ×2 IMPLANT
SPONGE T-LAP 4X18 ~~LOC~~+RFID (SPONGE) ×2 IMPLANT
SUT ETHILON 4 0 PS 2 18 (SUTURE) ×2 IMPLANT
SWAB COLLECTION DEVICE MRSA (MISCELLANEOUS) ×2 IMPLANT
TOWEL GREEN STERILE (TOWEL DISPOSABLE) ×2 IMPLANT
TOWEL GREEN STERILE FF (TOWEL DISPOSABLE) ×2 IMPLANT
TUBE CONNECTING 12X1/4 (SUCTIONS) ×2 IMPLANT
UNDERPAD 30X36 HEAVY ABSORB (UNDERPADS AND DIAPERS) ×2 IMPLANT
WATER STERILE IRR 1000ML POUR (IV SOLUTION) ×2 IMPLANT
YANKAUER SUCT BULB TIP NO VENT (SUCTIONS) ×2 IMPLANT

## 2021-02-08 NOTE — Anesthesia Preprocedure Evaluation (Addendum)
Anesthesia Evaluation  Patient identified by MRN, date of birth, ID band Patient awake    Reviewed: Allergy & Precautions, NPO status , Patient's Chart, lab work & pertinent test results  History of Anesthesia Complications Negative for: history of anesthetic complications  Airway Mallampati: I  TM Distance: >3 FB Neck ROM: Full    Dental  (+) Upper Dentures, Lower Dentures, Dental Advisory Given   Pulmonary neg pulmonary ROS,    Pulmonary exam normal        Cardiovascular hypertension, Pt. on medications Normal cardiovascular exam     Neuro/Psych negative neurological ROS  negative psych ROS   GI/Hepatic negative GI ROS, Neg liver ROS,   Endo/Other  diabetes, Type 2, Insulin Dependent  Renal/GU Renal disease (AKI)     Musculoskeletal  (+) Arthritis , Right hand infection   Abdominal   Peds  Hematology  (+) Blood dyscrasia, anemia ,   Anesthesia Other Findings Day of surgery medications reviewed with the patient.  Reproductive/Obstetrics                            Anesthesia Physical  Anesthesia Plan  ASA: 3  Anesthesia Plan: General   Post-op Pain Management:    Induction: Intravenous  PONV Risk Score and Plan: 3 and Dexamethasone, Ondansetron and Midazolam  Airway Management Planned: LMA  Additional Equipment:   Intra-op Plan:   Post-operative Plan: Extubation in OR  Informed Consent: I have reviewed the patients History and Physical, chart, labs and discussed the procedure including the risks, benefits and alternatives for the proposed anesthesia with the patient or authorized representative who has indicated his/her understanding and acceptance.     Dental advisory given  Plan Discussed with: CRNA and Anesthesiologist  Anesthesia Plan Comments:        Anesthesia Quick Evaluation

## 2021-02-08 NOTE — Brief Op Note (Signed)
02/08/2021  6:46 PM  PATIENT:  Christy Higgins  70 y.o. female  PRE-OPERATIVE DIAGNOSIS:  hand infection, right  POST-OPERATIVE DIAGNOSIS:  hand infection, right  PROCEDURE:  Procedure(s): IRRIGATION AND DEBRIDEMENT EXTREMITY (Right)  SURGEON:  Surgeon(s) and Role:    Gomez Cleverly, MD - Primary  PHYSICIAN ASSISTANT:   ASSISTANTS: none   ANESTHESIA:   general  EBL:  5 mL   BLOOD ADMINISTERED:none  DRAINS: none   LOCAL MEDICATIONS USED:  NONE  SPECIMEN:  No Specimen  DISPOSITION OF SPECIMEN:  N/A  COUNTS:  YES  TOURNIQUET:   Total Tourniquet Time Documented: Upper Arm (Right) - 25 minutes Total: Upper Arm (Right) - 25 minutes   DICTATION: .Reubin Milan Dictation  PLAN OF CARE: Admit to inpatient   PATIENT DISPOSITION:  PACU - hemodynamically stable.   Delay start of Pharmacological VTE agent (>24hrs) due to surgical blood loss or risk of bleeding: not applicable

## 2021-02-08 NOTE — Progress Notes (Signed)
Pharmacy Antibiotic Note  Christy Higgins is a 70 y.o. female admitted on 10/16 2022 with deep wrist abscess (right) (septic arthritis). Pharmacy has been consulted for vancomycin dosing.  SCr down to 0.94, CrCl~40-50 ml/min. Will adjust Vancomycin dose to target goal AUC.   Plan: - Reduce Vancomycin to 750 mg IV every 24 hours (eAUC 439, SCr 0.94, Vd 0.72) - Will continue to follow renal function, culture results, LOT, and antibiotic de-escalation plans    Temp (24hrs), Avg:98.7 F (37.1 C), Min:98.4 F (36.9 C), Max:99 F (37.2 C)  Recent Labs  Lab 02/03/21 1655 02/06/21 1346 02/06/21 1445 02/06/21 1546 02/07/21 0210 02/07/21 1134 02/08/21 0218  WBC 18.4* 30.0*  --   --  27.6*  --  17.7*  CREATININE 0.81 2.00*  --   --  1.42*  --  0.94  LATICACIDVEN 1.3  --  2.3* 1.4  --   --   --   VANCORANDOM  --   --   --   --   --  6  --      Estimated Creatinine Clearance: 46.5 mL/min (by C-G formula based on SCr of 0.94 mg/dL).    No Known Allergies  Vanc 10/19 >> CTX 10/19 >>  10/19 R-wrist tissue cx A >> few GPC on GS >> ngx1d 10/19 R-wrist tissue cx B >> no organisms on GS >> ngx1d 10/20 COVID/flu >> neg 10/20: MRSA PCR >> neg 10/20 BCx >> ngx1d  Thank you for allowing pharmacy to be a part of this patient's care.  Georgina Pillion, PharmD, BCPS Clinical Pharmacist Clinical phone for 02/08/2021: [phone] 02/08/2021 1:29 PM   **Pharmacist phone directory can now be found on [email or web address] ([email or web address]).  Listed under [facility].

## 2021-02-08 NOTE — Transfer of Care (Signed)
Immediate Anesthesia Transfer of Care Note  Patient: Serenitie Vinton  Procedure(s) Performed: IRRIGATION AND DEBRIDEMENT EXTREMITY (Right: Hand)  Patient Location: PACU  Anesthesia Type:General  Level of Consciousness: awake, alert  and oriented  Airway & Oxygen Therapy: Patient Spontanous Breathing  Post-op Assessment: Report given to RN and Post -op Vital signs reviewed and stable  Post vital signs: Reviewed and stable  Last Vitals:  Vitals Value Taken Time  BP 132/61   Temp    Pulse 103   Resp 30 02/08/21 1834  SpO2 96   Vitals shown include unvalidated device data.  Last Pain:  Vitals:   02/08/21 1658  TempSrc: Oral  PainSc: 8       Patients Stated Pain Goal: 4 (02/07/21 0005)  Complications: No notable events documented.

## 2021-02-08 NOTE — Progress Notes (Signed)
Patient ID: Christy Higgins, female   DOB: [birth year], 70 y.o.   MRN: [id number]  PROGRESS NOTE    Wakisha Alberts  UKG:[id number] DOB: 07-18-1950 DOA: 02/06/2021 PCP: Lindaann Pascal, PA-C   Brief Narrative:   70 y.o. female with medical history significant of HTN, diabetes mellitus type 2, HLD presented with right hand swelling and pain not improving with outpatient Bactrim.  On presentation, temperature was 99.2 with severe right hand swelling.  WBCs of 30 with creatinine of 2.0.  She was started on broad-spectrum antibiotics.  Hand surgery was consulted.  She underwent surgical intervention on 02/06/2021.  Assessment & Plan:   Sepsis: Present on admission Right wrist septic arthritis and right hand multiple deep space abscesses -Presented with worsening right hand pain and swelling with elevated WBCs, lactic acid and right wrist septic arthritis with multiple deep space abscesses as evidenced by MRI of right hand -Status post I&D of the right hand and wrist on 02/06/2021.  Wound care as per hand surgery recommendations.  OR cultures. -blood cultures negative so far. -Continue Rocephin and vancomycin. -For possible more surgical intervention by hand surgery today -CRP 20 today  Leukocytosis -Improving.  Monitor.  Anemia of chronic disease -Questionable cause.  Hemoglobin stable.  No signs of bleeding.  Monitor  Hyponatremia -Possibly from dehydration.  Improved.  IV fluids as below  Acute kidney injury -Presented with creatinine of 2.  Treated with IV fluids.  Creatinine 0.94 DC IV fluids  Diabetes mellitus type II -Metformin on hold.  Continue CBGs with SSI  Hypertension -Blood pressure stable.  Monitor   DVT prophylaxis:  SCDs Code Status: Full Family Communication: Husband at bedside Disposition Plan: Status is: Inpatient  Remains inpatient appropriate because: Needs IV antibiotics and possibly more hand surgery.  Consultants: Hand surgery  Procedures: Right hand and wrist  I&D on 02/06/2021  Antimicrobials:  Anti-infectives (From admission, onward)    Start     Dose/Rate Route Frequency Ordered Stop   02/08/21 0900  vancomycin (VANCOREADY) IVPB 750 mg/150 mL  Status:  Discontinued        750 mg 150 mL/hr over 60 Minutes Intravenous Every 24 hours 02/07/21 1052 02/07/21 1324   02/07/21 1415  vancomycin (VANCOCIN) IVPB 1000 mg/200 mL premix        1,000 mg 200 mL/hr over 60 Minutes Intravenous Every 24 hours 02/07/21 1324     02/06/21 1700  cefTRIAXone (ROCEPHIN) 2 g in sodium chloride 0.9 % 100 mL IVPB        2 g 200 mL/hr over 30 Minutes Intravenous Every 24 hours 02/06/21 1651     02/06/21 1545  vancomycin (VANCOCIN) IVPB 1000 mg/200 mL premix        1,000 mg 200 mL/hr over 60 Minutes Intravenous  Once 02/06/21 1538 02/06/21 1959        Subjective: Patient seen and examined at bedside.  Still complains of right hand pain but improving.  No overnight fever, vomiting, worsening shortness of breath reported.  Objective: Vitals:   02/07/21 2048 02/08/21 0033 02/08/21 0436 02/08/21 0739  BP: (!) 99/57 (!) 97/52 (!) 108/58 (!) 126/55  Pulse: 60 65 65 64  Resp:  18 18 18   Temp:  98.4 F (36.9 C) 99 F (37.2 C) 98.8 F (37.1 C)  TempSrc:  Oral Oral Oral  SpO2:  98% 98% 100%  Weight:      Height:        Intake/Output Summary (Last 24 hours) at 02/08/2021 0801 Last data  filed at 02/07/2021 1500 Gross per 24 hour  Intake 166.67 ml  Output --  Net 166.67 ml    Filed Weights   02/07/21 0900  Weight: 62.2 kg    Examination:  General exam: On room air currently.  No distress. Respiratory system: Decreased bilateral breath sounds at bases cardiovascular system: Rate controlled, S1-S2 heard Gastrointestinal system: Abdomen is distended slightly, soft and nontender.  Bowel sounds are heard Extremities: Dressing present on the right hand.  No clubbing or lower extremity edema  Central nervous system: Awake and alert.  No focal neurological  deficits.  Moves extremities  skin: No obvious petechiae/rashes psychiatry: Mood, affect and judgment are normal   Data Reviewed: I have personally reviewed following labs and imaging studies  CBC: Recent Labs  Lab 02/03/21 1655 02/06/21 1346 02/07/21 0210 02/08/21 0218  WBC 18.4* 30.0* 27.6* 17.7*  NEUTROABS 15.3* 28.2*  --  13.9*  HGB 10.9* 9.1* 8.4* 8.6*  HCT 34.4* 28.4* 25.7* 26.1*  MCV 86.9 85.5 84.0 83.7  PLT 219 243 261 276    Basic Metabolic Panel: Recent Labs  Lab 02/03/21 1655 02/06/21 1346 02/07/21 0210 02/08/21 0218  NA 136 130* 134* 138  K 3.9 3.6 4.2 4.4  CL 101 99 106 109  CO2 24 21* 20* 22  GLUCOSE 101* 71 112* 88  BUN 17 30* 22 15  CREATININE 0.81 2.00* 1.42* 0.94  CALCIUM 9.1 8.4* 8.0* 8.4*  MG  --   --   --  2.0    GFR: Estimated Creatinine Clearance: 46.5 mL/min (by C-G formula based on SCr of 0.94 mg/dL). Liver Function Tests: Recent Labs  Lab 02/03/21 1655 02/06/21 1346  AST 41 26  ALT 37 24  ALKPHOS 90 99  BILITOT 1.1 0.5  PROT 7.7 6.3*  ALBUMIN 2.9* 2.3*    No results for input(s): LIPASE, AMYLASE in the last 168 hours. No results for input(s): AMMONIA in the last 168 hours. Coagulation Profile: No results for input(s): INR, PROTIME in the last 168 hours. Cardiac Enzymes: No results for input(s): CKTOTAL, CKMB, CKMBINDEX, TROPONINI in the last 168 hours. BNP (last 3 results) No results for input(s): PROBNP in the last 8760 hours. HbA1C: Recent Labs    02/06/21 1631  HGBA1C 6.4*    CBG: Recent Labs  Lab 02/07/21 1716 02/07/21 2022 02/08/21 0030 02/08/21 0428 02/08/21 0741  GLUCAP 91 92 85 80 74    Lipid Profile: No results for input(s): CHOL, HDL, LDLCALC, TRIG, CHOLHDL, LDLDIRECT in the last 72 hours. Thyroid Function Tests: No results for input(s): TSH, T4TOTAL, FREET4, T3FREE, THYROIDAB in the last 72 hours. Anemia Panel: No results for input(s): VITAMINB12, FOLATE, FERRITIN, TIBC, IRON, RETICCTPCT in the  last 72 hours. Sepsis Labs: Recent Labs  Lab 02/03/21 1655 02/06/21 1445 02/06/21 1546  LATICACIDVEN 1.3 2.3* 1.4     Recent Results (from the past 240 hour(s))  Resp Panel by RT-PCR (Flu A&B, Covid) Nasopharyngeal Swab     Status: None   Collection Time: 02/04/21  1:42 AM   Specimen: Nasopharyngeal Swab; Nasopharyngeal(NP) swabs in vial transport medium  Result Value Ref Range Status   SARS Coronavirus 2 by RT PCR NEGATIVE NEGATIVE Final    Comment: (NOTE) SARS-CoV-2 target nucleic acids are NOT DETECTED.  The SARS-CoV-2 RNA is generally detectable in upper respiratory specimens during the acute phase of infection. The lowest concentration of SARS-CoV-2 viral copies this assay can detect is 138 copies/mL. A negative result does not preclude SARS-Cov-2 infection  and should not be used as the sole basis for treatment or other patient management decisions. A negative result may occur with  improper specimen collection/handling, submission of specimen other than nasopharyngeal swab, presence of viral mutation(s) within the areas targeted by this assay, and inadequate number of viral copies(<138 copies/mL). A negative result must be combined with clinical observations, patient history, and epidemiological information. The expected result is Negative.  Fact Sheet for Patients:  BloggerCourse.com  Fact Sheet for Healthcare Providers:  SeriousBroker.it  This test is no t yet approved or cleared by the [place] FDA and  has been authorized for detection and/or diagnosis of SARS-CoV-2 by FDA under an Emergency Use Authorization (EUA). This EUA will remain  in effect (meaning this test can be used) for the duration of the COVID-19 declaration under Section 564(b)(1) of the Act, 21 U.S.C.section 360bbb-3(b)(1), unless the authorization is terminated  or revoked sooner.       Influenza A by PCR NEGATIVE NEGATIVE Final    Influenza B by PCR NEGATIVE NEGATIVE Final    Comment: (NOTE) The Xpert Xpress SARS-CoV-2/FLU/RSV plus assay is intended as an aid in the diagnosis of influenza from Nasopharyngeal swab specimens and should not be used as a sole basis for treatment. Nasal washings and aspirates are unacceptable for Xpert Xpress SARS-CoV-2/FLU/RSV testing.  Fact Sheet for Patients: BloggerCourse.com  Fact Sheet for Healthcare Providers: SeriousBroker.it  This test is not yet approved or cleared by the [place] FDA and has been authorized for detection and/or diagnosis of SARS-CoV-2 by FDA under an Emergency Use Authorization (EUA). This EUA will remain in effect (meaning this test can be used) for the duration of the COVID-19 declaration under Section 564(b)(1) of the Act, 21 U.S.C. section 360bbb-3(b)(1), unless the authorization is terminated or revoked.  Performed at [facility], [address], [place], [place] [address]   Resp Panel by RT-PCR (Flu A&B, Covid) Nasopharyngeal Swab     Status: None   Collection Time: 02/06/21  2:31 PM   Specimen: Nasopharyngeal Swab; Nasopharyngeal(NP) swabs in vial transport medium  Result Value Ref Range Status   SARS Coronavirus 2 by RT PCR NEGATIVE NEGATIVE Final    Comment: (NOTE) SARS-CoV-2 target nucleic acids are NOT DETECTED.  The SARS-CoV-2 RNA is generally detectable in upper respiratory specimens during the acute phase of infection. The lowest concentration of SARS-CoV-2 viral copies this assay can detect is 138 copies/mL. A negative result does not preclude SARS-Cov-2 infection and should not be used as the sole basis for treatment or other patient management decisions. A negative result may occur with  improper specimen collection/handling, submission of specimen other than nasopharyngeal swab, presence of viral mutation(s) within the areas targeted by this assay, and inadequate  number of viral copies(<138 copies/mL). A negative result must be combined with clinical observations, patient history, and epidemiological information. The expected result is Negative.  Fact Sheet for Patients:  BloggerCourse.com  Fact Sheet for Healthcare Providers:  SeriousBroker.it  This test is no t yet approved or cleared by the [place] FDA and  has been authorized for detection and/or diagnosis of SARS-CoV-2 by FDA under an Emergency Use Authorization (EUA). This EUA will remain  in effect (meaning this test can be used) for the duration of the COVID-19 declaration under Section 564(b)(1) of the Act, 21 U.S.C.section 360bbb-3(b)(1), unless the authorization is terminated  or revoked sooner.       Influenza A by PCR NEGATIVE NEGATIVE Final   Influenza  B by PCR NEGATIVE NEGATIVE Final    Comment: (NOTE) The Xpert Xpress SARS-CoV-2/FLU/RSV plus assay is intended as an aid in the diagnosis of influenza from Nasopharyngeal swab specimens and should not be used as a sole basis for treatment. Nasal washings and aspirates are unacceptable for Xpert Xpress SARS-CoV-2/FLU/RSV testing.  Fact Sheet for Patients: BloggerCourse.com  Fact Sheet for Healthcare Providers: SeriousBroker.it  This test is not yet approved or cleared by the [place] FDA and has been authorized for detection and/or diagnosis of SARS-CoV-2 by FDA under an Emergency Use Authorization (EUA). This EUA will remain in effect (meaning this test can be used) for the duration of the COVID-19 declaration under Section 564(b)(1) of the Act, 21 U.S.C. section 360bbb-3(b)(1), unless the authorization is terminated or revoked.  Performed at [facility], [address] [place], [place] [address]   Aerobic/Anaerobic Culture w Gram Stain (surgical/deep wound)     Status: None (Preliminary result)    Collection Time: 02/06/21 10:35 PM   Specimen: Wound  Result Value Ref Range Status   Specimen Description WOUND  Final   Special Requests RIGHT WRIST WOUND SPEC A  Final   Gram Stain   Final    FEW SQUAMOUS EPITHELIAL CELLS PRESENT MODERATE WBC SEEN FEW GRAM POSITIVE COCCI Performed at [facility], [address], [place], [place] [address]    Culture PENDING  Incomplete   Report Status PENDING  Incomplete  Aerobic/Anaerobic Culture w Gram Stain (surgical/deep wound)     Status: None (Preliminary result)   Collection Time: 02/06/21 10:49 PM   Specimen: PATH Soft tissue  Result Value Ref Range Status   Specimen Description TISSUE  Final   Special Requests RIGHT WOUND TISSUE SPEC B  Final   Gram Stain   Final    FEW SQUAMOUS EPITHELIAL CELLS PRESENT FEW WBC SEEN NO ORGANISMS SEEN Performed at [facility], [address], [place], [place] [address]    Culture PENDING  Incomplete   Report Status PENDING  Incomplete  Surgical pcr screen     Status: None   Collection Time: 02/07/21 10:46 PM   Specimen: Nasal Mucosa; Nasal Swab  Result Value Ref Range Status   MRSA, PCR NEGATIVE NEGATIVE Final   Staphylococcus aureus NEGATIVE NEGATIVE Final    Comment: (NOTE) The Xpert SA Assay (FDA approved for NASAL specimens in patients 13 years of age and older), is one component of a comprehensive surveillance program. It is not intended to diagnose infection nor to guide or monitor treatment. Performed at [facility], [address], [place], [place] [address]           Radiology Studies: MR HAND RIGHT WO CONTRAST  Result Date: 02/06/2021 CLINICAL DATA:  Septic arthritis suspected, hand, no prior imaging EXAM: MRI OF THE RIGHT HAND WITHOUT CONTRAST TECHNIQUE: Multiplanar, multisequence MR imaging of the right hand was performed. No intravenous contrast was administered. COMPARISON:  02/03/2021 radiograph FINDINGS: Bones/Joint/Cartilage There is severe  radiocarpal arthritis with subchondral cystic change and bony productive change at the DRUJ. There is scapholunate dissociation with mild proximal migration of the capitate. There is bony edema signal within the lunate and distal radius, likely reactive from arthritis. There is no definitive osteomyelitis. Mild radiocarpal, DRUJ, and intercarpal effusion. Ligaments Torn scapholunate ligament. Mild subluxation of the ECU tendon compatible with ECU subsheath tear. Muscles/Tendons/Soft tissues There is diffuse flexor tenosynovitis in the hand and wrist, with distention of the ulnar and radial  bursa in the palm and extending proximally into the forearm (for perspective see coronal STIR image 13 for proximal-distal extent). There is a focal fluid collection deep to the second digit flexor tendons measuring 1.5 x 3.0 cm short axis and 2.6 cm in length (axial T2 image 24, coronal stir image 13). There is a fluid collection between the fourth and fifth metacarpals measuring 1.2 x 1.0 cm axially and 3.3 cm in length (axial T2 image 24, coronal stir image 9). There are also more superficial fluid collections along the dorsum of the hand and tracking along the ulnar aspect of the wrist superficially. Ulnar-sided more superficial collection measures approximately 3.5 x 1.4 cm axially (axial T2 image 32). Another collection courses along the are superficial aspect of the distal ulna medially measuring 2.6 x 1.3 cm (axial T2 image 40). Diffuse soft tissue swelling of the hand. There is mild tenosynovial joint distension of multiple extensor compartments, most notable of extensor compartments 2, 3, 4, and 5 at the level of the distal radius (axial T2 image 40). IMPRESSION: Severe soft tissue infection of the hand and wrist with multifocal superficial and deep fluid collections, likely abscesses, and with diffuse flexor tenosynovitis as described above. Milder tenosynovitis of extensor compartments 2, 3, 4, and 5 at the level of  the distal radius. 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 Bony edema signal within the carpus and distal radius is favored to be related to patient's pre-existing wrist arthritis, though osteomyelitis would be difficult to exclude given the severity of the soft tissue infection. These results were called by telephone at the time of interpretation on 02/06/2021 at 9:10 pm to provider [facility] , who verbally acknowledged these results. Electronically Signed   By: Caprice Renshaw M.D.   On: 02/06/2021 21:13   US RENAL  Result Date: 02/06/2021 CLINICAL DATA:  Acute renal injury. EXAM: RENAL / URINARY TRACT ULTRASOUND COMPLETE COMPARISON:  None. FINDINGS: Right Kidney: Renal measurements: 9.9 x 4.2 x 4.0 cm = volume: 86.2 mL. Echogenicity within normal limits. No mass or hydronephrosis visualized. Left Kidney: Renal measurements: 9.0 x 4.8 x 3.8 cm = volume: 85.34 mL. Echogenicity within normal limits. No mass or hydronephrosis visualized. Bladder: Appears normal for degree of bladder distention. Other: None. IMPRESSION: Normal renal ultrasound. Electronically Signed   By: Darliss Cheney M.D.   On: 02/06/2021 18:03        Scheduled Meds:  aspirin EC  81 mg Oral Daily   insulin aspart  0-9 Units Subcutaneous TID WC   senna-docusate  1 tablet Oral BID   Continuous Infusions:  cefTRIAXone (ROCEPHIN)  IV 2 g (02/07/21 1711)   vancomycin 1,000 mg (02/07/21 1408)          Glade Lloyd, MD [facility] 02/08/2021, 8:01 AM

## 2021-02-08 NOTE — Progress Notes (Signed)
Hypoglycemic Event  CBG: 63  Treatment: D50 50 mL (25 gm)  Symptoms: None  Follow-up CBG: Time:1339 CBG Result:140  Possible Reasons for Event: Other: NPO for surgery  Comments/MD notified:Dr Alekh notified and wanted full amp of D50 to be given      Xitlally Mooneyham E

## 2021-02-08 NOTE — Progress Notes (Signed)
At 1611 report given to short stay RN Koleen Nimrod. Pt to go to OR at this time.  CBG taken at 1621 and was down again to 67, pt was asymptomatic.  Short stay called back, spoke with RN Koleen Nimrod again, and informed of pt CBG 67.   She requested to send pt on to short stay and they would give her dextrose there.  Pt sent with transporter, pt alert and oriented and asymptomatic. IV vanc infusing into her IV at this time.

## 2021-02-08 NOTE — Progress Notes (Signed)
Discussed the risks and benefits of surgery to right hand infection I&D. These include stiffness, injuries to nerves and blood vessels, need for further surgery and the benefits would be to decrease bacterial burden to allow for the antibiotics to clear her right hand infection. She understands and would like to proceed. Informed consent obtained.  Plan to proceed to OR today for right hand irrigation and excisional debridement.   Mathis Dad, MD Orthopaedic Hand Surgeon [facility] Office number: [phone] [address], [address] [place], [place] [address]

## 2021-02-08 NOTE — Anesthesia Procedure Notes (Signed)
Procedure Name: Intubation Date/Time: 02/08/2021 5:44 PM Performed by: Lynnell Chad, CRNA Pre-anesthesia Checklist: Patient identified, Emergency Drugs available, Suction available and Patient being monitored Patient Re-evaluated:Patient Re-evaluated prior to induction Oxygen Delivery Method: Circle System Utilized Preoxygenation: Pre-oxygenation with 100% oxygen Induction Type: IV induction Ventilation: Mask ventilation without difficulty Laryngoscope Size: Miller and 3 Grade View: Grade I Tube type: Oral Tube size: 7.0 mm Number of attempts: 1 Airway Equipment and Method: Stylet and Oral airway Placement Confirmation: ETT inserted through vocal cords under direct vision, positive ETCO2 and breath sounds checked- equal and bilateral Secured at: 21 cm Tube secured with: Tape Dental Injury: Teeth and Oropharynx as per pre-operative assessment

## 2021-02-09 ENCOUNTER — Encounter (HOSPITAL_COMMUNITY): Payer: Self-pay | Admitting: Orthopedic Surgery

## 2021-02-09 DIAGNOSIS — D638 Anemia in other chronic diseases classified elsewhere: Secondary | ICD-10-CM | POA: Diagnosis not present

## 2021-02-09 DIAGNOSIS — I10 Essential (primary) hypertension: Secondary | ICD-10-CM | POA: Diagnosis not present

## 2021-02-09 DIAGNOSIS — M19031 Primary osteoarthritis, right wrist: Secondary | ICD-10-CM | POA: Diagnosis not present

## 2021-02-09 DIAGNOSIS — N179 Acute kidney failure, unspecified: Secondary | ICD-10-CM | POA: Diagnosis not present

## 2021-02-09 DIAGNOSIS — E1165 Type 2 diabetes mellitus with hyperglycemia: Secondary | ICD-10-CM

## 2021-02-09 LAB — CBC WITH DIFFERENTIAL/PLATELET
Abs Immature Granulocytes: 0.3 10*3/uL — ABNORMAL HIGH (ref 0.00–0.07)
Basophils Absolute: 0 10*3/uL (ref 0.0–0.1)
Basophils Relative: 0 %
Eosinophils Absolute: 0.2 10*3/uL (ref 0.0–0.5)
Eosinophils Relative: 1 %
HCT: 24.8 % — ABNORMAL LOW (ref 36.0–46.0)
Hemoglobin: 8 g/dL — ABNORMAL LOW (ref 12.0–15.0)
Immature Granulocytes: 2 %
Lymphocytes Relative: 17 %
Lymphs Abs: 2.3 10*3/uL (ref 0.7–4.0)
MCH: 27.6 pg (ref 26.0–34.0)
MCHC: 32.3 g/dL (ref 30.0–36.0)
MCV: 85.5 fL (ref 80.0–100.0)
Monocytes Absolute: 1 10*3/uL (ref 0.1–1.0)
Monocytes Relative: 8 %
Neutro Abs: 9.4 10*3/uL — ABNORMAL HIGH (ref 1.7–7.7)
Neutrophils Relative %: 72 %
Platelets: 276 10*3/uL (ref 150–400)
RBC: 2.9 MIL/uL — ABNORMAL LOW (ref 3.87–5.11)
RDW: 15.2 % (ref 11.5–15.5)
WBC: 13.2 10*3/uL — ABNORMAL HIGH (ref 4.0–10.5)
nRBC: 0 % (ref 0.0–0.2)

## 2021-02-09 LAB — BASIC METABOLIC PANEL
Anion gap: 5 (ref 5–15)
BUN: 12 mg/dL (ref 8–23)
CO2: 22 mmol/L (ref 22–32)
Calcium: 7.9 mg/dL — ABNORMAL LOW (ref 8.9–10.3)
Chloride: 111 mmol/L (ref 98–111)
Creatinine, Ser: 0.86 mg/dL (ref 0.44–1.00)
GFR, Estimated: >60 mL/min (ref >60)
Glucose, Bld: 118 mg/dL — ABNORMAL HIGH (ref 70–99)
Potassium: 4.1 mmol/L (ref 3.5–5.1)
Sodium: 138 mmol/L (ref 135–145)

## 2021-02-09 LAB — GLUCOSE, CAPILLARY
Glucose-Capillary: 140 mg/dL — ABNORMAL HIGH (ref 70–99)
Glucose-Capillary: 92 mg/dL (ref 70–99)
Glucose-Capillary: 102 mg/dL — ABNORMAL HIGH (ref 70–99)
Glucose-Capillary: 108 mg/dL — ABNORMAL HIGH (ref 70–99)
Glucose-Capillary: 90 mg/dL (ref 70–99)

## 2021-02-09 LAB — C-REACTIVE PROTEIN: CRP: 15 mg/dL — ABNORMAL HIGH (ref <1.0)

## 2021-02-09 LAB — MAGNESIUM: Magnesium: 1.8 mg/dL (ref 1.7–2.4)

## 2021-02-09 MED ORDER — LACTATED RINGERS IV SOLN: INTRAVENOUS | Status: DC

## 2021-02-09 MED ORDER — SODIUM CHLORIDE 0.9 % IV BOLUS
500.0000 mL | Freq: Once | INTRAVENOUS | Status: AC
  Administered 2021-02-09: 500 mL via INTRAVENOUS

## 2021-02-09 MED ORDER — ALBUMIN HUMAN 5 % IV SOLN
12.5000 g | Freq: Once | INTRAVENOUS | Status: AC
  Administered 2021-02-09: 12.5 g via INTRAVENOUS
  Filled 2021-02-09: qty 250

## 2021-02-09 MED ORDER — ACETAMINOPHEN 10 MG/ML IV SOLN
1000.0000 mg | Freq: Once | INTRAVENOUS | Status: AC
  Administered 2021-02-09: 1000 mg via INTRAVENOUS
  Filled 2021-02-09: qty 100

## 2021-02-09 MED ORDER — ACETAMINOPHEN 500 MG PO TABS: 1000.0000 mg | ORAL_TABLET | Freq: Once | ORAL | Status: DC

## 2021-02-09 NOTE — Op Note (Addendum)
OPERATIVE NOTE  DATE OF PROCEDURE: 02/08/2021  SURGEONS:  Primary: Orene Desanctis, MD  PREOPERATIVE DIAGNOSIS: Right hand and wrist infection, septic arthritis  POSTOPERATIVE DIAGNOSIS: Same  NAME OF PROCEDURE:   Incision and drainage of wrist deep abscess right wrist volar aspect of forearm-[id number] Incision and drainage wrist deep abscess right wrist volar part carpal canal-25028 Incision and drainage right wrist deep abscess dorsum of hand and carpometacarpal joint region-25028 Right wrist arthrotomy with exploration and drainage-25040  ANESTHESIA: General  SKIN PREPARATION: Hibiclens  ESTIMATED BLOOD LOSS: Minimal  IMPLANTS: none  INDICATIONS:  Christy Higgins is a 70 y.o. female who has the above preoperative diagnosis. The patient has decided to proceed with surgical intervention.  Risks, benefits and alternatives of operative management were discussed including, but not limited to, risks of anesthesia complications, infection, pain, persistent symptoms, stiffness, need for future surgery.  The patient understands, agrees and elects to proceed with surgery.    DESCRIPTION OF PROCEDURE: The patient was met in the pre-operative area and their identity was verified.  The operative location and laterality was also verified and marked.  The patient was brought to the OR and was placed supine on the table.  After repeat patient identification with the operative team anesthesia was provided and the patient was prepped and draped in the usual sterile fashion.  A final timeout was performed verifying the correction patient, procedure, location and laterality.  Preoperative antibiotics were redosed and the surgery began by elevation of the right upper extremity and elevation of the tourniquet to 250 mmHg.  The previous sutures were removed with surgery began by incision through the skin and subcutaneous tissues  at the distal forearm and wrist and  drainage of deep abscess of the wrist and distal  forearm was performed.  The neurovascular structures were protected throughout the incision and drainage and all purulent material was expressed and removed.  Thorough irrigation with normal saline via low flow cystoscopy tubing was performed down to the level of the distal radial ulnar joint.  At this time attention was turned to the right hand where the palmar bursae were entered down through the carpal tunnel.  Purulence was then removed and normal saline irrigation was performed.  The volar wrist had no remaining purulence and the tissues remained viable.  At this time attention was then turned to the dorsal incision which previous sutures were removed and skin and subcutaneous tissues were divided down to the level of the fourth extensor compartment.  He tendons were protected and the level of the carpometacarpal joints and the dorsal radiocarpal articulation an incision and drainage of the deep abscess was performed.  All purulent material was removed and via low flow cystoscopy tubing wound was irrigated with normal saline.  There is no remaining purulence at this level.  At this time and loose closure was performed with nylon suture followed by a sterile soft dressing and a fiberglass volar resting splint.  Patient tolerated the procedure well was brought to PACU for recovery in stable condition.  In the case all counts were correct x2.  Postoperative plan plan for elevation and finger range of motion and continued IV antibiotics as an inpatient.  The patient will return to the operating room for further debridement and definitive closure in a few days.   Matt Holmes, MD

## 2021-02-09 NOTE — Anesthesia Postprocedure Evaluation (Signed)
Anesthesia Post Note  Patient: Omaya Nieland  Procedure(s) Performed: IRRIGATION AND DEBRIDEMENT EXTREMITY (Right: Hand)     Patient location during evaluation: PACU Anesthesia Type: General Level of consciousness: awake and alert Pain management: pain level controlled Vital Signs Assessment: post-procedure vital signs reviewed and stable Respiratory status: spontaneous breathing, nonlabored ventilation, respiratory function stable and patient connected to nasal cannula oxygen Cardiovascular status: blood pressure returned to baseline and stable Postop Assessment: no apparent nausea or vomiting Anesthetic complications: no   No notable events documented.  Last Vitals:  Vitals:   02/08/21 2027 02/08/21 2358  BP: 96/79 (!) 84/45  Pulse: 85 65  Resp: 16 17  Temp: 36.9 C 36.8 C  SpO2: 99% 96%    Last Pain:  Vitals:   02/09/21 0125  TempSrc:   PainSc: Asleep                 Gardenia Witter

## 2021-02-09 NOTE — Progress Notes (Signed)
Patient ID: Christy Higgins, female   DOB: [birth year], 70 y.o.   MRN: [id number]  PROGRESS NOTE    Jesslynn Kruck  EGB:[id number] DOB: March 19, 1951 DOA: 02/06/2021 PCP: Lindaann Pascal, PA-C   Brief Narrative:   70 y.o. female with medical history significant of HTN, diabetes mellitus type 2, HLD presented with right hand swelling and pain not improving with outpatient Bactrim.  On presentation, temperature was 99.2 with severe right hand swelling.  WBCs of 30 with creatinine of 2.0.  She was started on broad-spectrum antibiotics.  Hand surgery was consulted.  She underwent surgical intervention on 02/06/2021.  10/22 no overnight issues  Assessment & Plan:   Sepsis: Present on admission Right wrist septic arthritis and right hand multiple deep space abscesses -Presented with worsening right hand pain and swelling with elevated WBCs, lactic acid and right wrist septic arthritis with multiple deep space abscesses as evidenced by MRI of right hand -Status post I&D of the right hand and wrist on 02/06/2021.  Wound care as per hand surgery recommendations.  OR cultures. -blood cultures negative so far. 10/22 status post I&D of the right extremity on 10/21 by Dr. Yehuda Budd Continue ceftriaxone and vancomycin Leukocytosis improving, CRP decreasing   Leukocytosis Due to above Improving  Anemia of chronic disease Questionable cause H&H stable  Hyponatremia Mild, improved with IV fluid  Acute kidney injury Prerenal improved with IV fluids   Diabetes mellitus type II Metformin on hold BG stable Continue R-ISS  Hypertension BP on low side will start on IV fluids   DVT prophylaxis:  SCDs Code Status: Full Family Communication: None at bedside Disposition Plan: Status is: Inpatient  Remains inpatient appropriate because: Needs IV antibiotics and IV fluids.  She is hypotensive Consultants: Hand surgery  Procedures: Right hand and wrist I&D on 02/06/2021  Antimicrobials:  Anti-infectives  (From admission, onward)    Start     Dose/Rate Route Frequency Ordered Stop   02/08/21 1400  vancomycin (VANCOREADY) IVPB 750 mg/150 mL        750 mg 150 mL/hr over 60 Minutes Intravenous Every 24 hours 02/08/21 1226     02/08/21 0900  vancomycin (VANCOREADY) IVPB 750 mg/150 mL  Status:  Discontinued        750 mg 150 mL/hr over 60 Minutes Intravenous Every 24 hours 02/07/21 1052 02/07/21 1324   02/07/21 1415  vancomycin (VANCOCIN) IVPB 1000 mg/200 mL premix  Status:  Discontinued        1,000 mg 200 mL/hr over 60 Minutes Intravenous Every 24 hours 02/07/21 1324 02/08/21 1226   02/06/21 1700  cefTRIAXone (ROCEPHIN) 2 g in sodium chloride 0.9 % 100 mL IVPB        2 g 200 mL/hr over 30 Minutes Intravenous Every 24 hours 02/06/21 1651     02/06/21 1545  vancomycin (VANCOCIN) IVPB 1000 mg/200 mL premix        1,000 mg 200 mL/hr over 60 Minutes Intravenous  Once 02/06/21 1538 02/06/21 1959        Subjective: No shortness of breath or chest pain or dizziness  Objective: Vitals:   02/08/21 2358 02/09/21 0406 02/09/21 0623 02/09/21 0721  BP: (!) 84/45 (!) 83/55 (!) 101/55 (!) 93/51  Pulse: 65 (!) 54 (!) 59 70  Resp: 17 17 17 16   Temp: 98.3 F (36.8 C) 98.7 F (37.1 C) 98.5 F (36.9 C) 99 F (37.2 C)  TempSrc: Oral Oral Oral Oral  SpO2: 96% 97% 98% 97%  Weight:  Height:        Intake/Output Summary (Last 24 hours) at 02/09/2021 0825 Last data filed at 02/08/2021 2028 Gross per 24 hour  Intake 1244.97 ml  Output 5 ml  Net 1239.97 ml   Filed Weights   02/07/21 0900  Weight: 62.2 kg    Examination: Nad, calm Cta no w/r Regular s1/s2 no gallop Soft benign +bs No edema Dressing of RUE Grossly intact awake and alert   Data Reviewed: I have personally reviewed following labs and imaging studies  CBC: Recent Labs  Lab 02/03/21 1655 02/06/21 1346 02/07/21 0210 02/08/21 0218 02/09/21 0136  WBC 18.4* 30.0* 27.6* 17.7* 13.2*  NEUTROABS 15.3* 28.2*  --   13.9* 9.4*  HGB 10.9* 9.1* 8.4* 8.6* 8.0*  HCT 34.4* 28.4* 25.7* 26.1* 24.8*  MCV 86.9 85.5 84.0 83.7 85.5  PLT 219 243 261 276 276   Basic Metabolic Panel: Recent Labs  Lab 02/03/21 1655 02/06/21 1346 02/07/21 0210 02/08/21 0218 02/09/21 0136  NA 136 130* 134* 138 138  K 3.9 3.6 4.2 4.4 4.1  CL 101 99 106 109 111  CO2 24 21* 20* 22 22  GLUCOSE 101* 71 112* 88 118*  BUN 17 30* 22 15 12   CREATININE 0.81 2.00* 1.42* 0.94 0.86  CALCIUM 9.1 8.4* 8.0* 8.4* 7.9*  MG  --   --   --  2.0 1.8   GFR: Estimated Creatinine Clearance: 50.9 mL/min (by C-G formula based on SCr of 0.86 mg/dL). Liver Function Tests: Recent Labs  Lab 02/03/21 1655 02/06/21 1346  AST 41 26  ALT 37 24  ALKPHOS 90 99  BILITOT 1.1 0.5  PROT 7.7 6.3*  ALBUMIN 2.9* 2.3*   No results for input(s): LIPASE, AMYLASE in the last 168 hours. No results for input(s): AMMONIA in the last 168 hours. Coagulation Profile: No results for input(s): INR, PROTIME in the last 168 hours. Cardiac Enzymes: No results for input(s): CKTOTAL, CKMB, CKMBINDEX, TROPONINI in the last 168 hours. BNP (last 3 results) No results for input(s): PROBNP in the last 8760 hours. HbA1C: Recent Labs    02/06/21 1631  HGBA1C 6.4*   CBG: Recent Labs  Lab 02/08/21 1621 02/08/21 1729 02/08/21 1835 02/08/21 2221 02/09/21 0757  GLUCAP 67* 104* 93 116* 140*   Lipid Profile: No results for input(s): CHOL, HDL, LDLCALC, TRIG, CHOLHDL, LDLDIRECT in the last 72 hours. Thyroid Function Tests: No results for input(s): TSH, T4TOTAL, FREET4, T3FREE, THYROIDAB in the last 72 hours. Anemia Panel: No results for input(s): VITAMINB12, FOLATE, FERRITIN, TIBC, IRON, RETICCTPCT in the last 72 hours. Sepsis Labs: Recent Labs  Lab 02/03/21 1655 02/06/21 1445 02/06/21 1546  LATICACIDVEN 1.3 2.3* 1.4    Recent Results (from the past 240 hour(s))  Resp Panel by RT-PCR (Flu A&B, Covid) Nasopharyngeal Swab     Status: None   Collection Time:  02/04/21  1:42 AM   Specimen: Nasopharyngeal Swab; Nasopharyngeal(NP) swabs in vial transport medium  Result Value Ref Range Status   SARS Coronavirus 2 by RT PCR NEGATIVE NEGATIVE Final    Comment: (NOTE) SARS-CoV-2 target nucleic acids are NOT DETECTED.  The SARS-CoV-2 RNA is generally detectable in upper respiratory specimens during the acute phase of infection. The lowest concentration of SARS-CoV-2 viral copies this assay can detect is 138 copies/mL. A negative result does not preclude SARS-Cov-2 infection and should not be used as the sole basis for treatment or other patient management decisions. A negative result may occur with  improper specimen collection/handling,  submission of specimen other than nasopharyngeal swab, presence of viral mutation(s) within the areas targeted by this assay, and inadequate number of viral copies(<138 copies/mL). A negative result must be combined with clinical observations, patient history, and epidemiological information. The expected result is Negative.  Fact Sheet for Patients:  BloggerCourse.com  Fact Sheet for Healthcare Providers:  SeriousBroker.it  This test is no t yet approved or cleared by the [place] FDA and  has been authorized for detection and/or diagnosis of SARS-CoV-2 by FDA under an Emergency Use Authorization (EUA). This EUA will remain  in effect (meaning this test can be used) for the duration of the COVID-19 declaration under Section 564(b)(1) of the Act, 21 U.S.C.section 360bbb-3(b)(1), unless the authorization is terminated  or revoked sooner.       Influenza A by PCR NEGATIVE NEGATIVE Final   Influenza B by PCR NEGATIVE NEGATIVE Final    Comment: (NOTE) The Xpert Xpress SARS-CoV-2/FLU/RSV plus assay is intended as an aid in the diagnosis of influenza from Nasopharyngeal swab specimens and should not be used as a sole basis for treatment. Nasal washings  and aspirates are unacceptable for Xpert Xpress SARS-CoV-2/FLU/RSV testing.  Fact Sheet for Patients: BloggerCourse.com  Fact Sheet for Healthcare Providers: SeriousBroker.it  This test is not yet approved or cleared by the [place] FDA and has been authorized for detection and/or diagnosis of SARS-CoV-2 by FDA under an Emergency Use Authorization (EUA). This EUA will remain in effect (meaning this test can be used) for the duration of the COVID-19 declaration under Section 564(b)(1) of the Act, 21 U.S.C. section 360bbb-3(b)(1), unless the authorization is terminated or revoked.  Performed at [facility], [address] [place], [place] [address]   Resp Panel by RT-PCR (Flu A&B, Covid) Nasopharyngeal Swab     Status: None   Collection Time: 02/06/21  2:31 PM   Specimen: Nasopharyngeal Swab; Nasopharyngeal(NP) swabs in vial transport medium  Result Value Ref Range Status   SARS Coronavirus 2 by RT PCR NEGATIVE NEGATIVE Final    Comment: (NOTE) SARS-CoV-2 target nucleic acids are NOT DETECTED.  The SARS-CoV-2 RNA is generally detectable in upper respiratory specimens during the acute phase of infection. The lowest concentration of SARS-CoV-2 viral copies this assay can detect is 138 copies/mL. A negative result does not preclude SARS-Cov-2 infection and should not be used as the sole basis for treatment or other patient management decisions. A negative result may occur with  improper specimen collection/handling, submission of specimen other than nasopharyngeal swab, presence of viral mutation(s) within the areas targeted by this assay, and inadequate number of viral copies(<138 copies/mL). A negative result must be combined with clinical observations, patient history, and epidemiological information. The expected result is Negative.  Fact Sheet for Patients:  BloggerCourse.com  Fact Sheet  for Healthcare Providers:  SeriousBroker.it  This test is no t yet approved or cleared by the [place] FDA and  has been authorized for detection and/or diagnosis of SARS-CoV-2 by FDA under an Emergency Use Authorization (EUA). This EUA will remain  in effect (meaning this test can be used) for the duration of the COVID-19 declaration under Section 564(b)(1) of the Act, 21 U.S.C.section 360bbb-3(b)(1), unless the authorization is terminated  or revoked sooner.       Influenza A by PCR NEGATIVE NEGATIVE Final   Influenza B by PCR NEGATIVE NEGATIVE Final    Comment: (NOTE) The Xpert Xpress SARS-CoV-2/FLU/RSV plus assay is intended as an aid in the diagnosis of  influenza from Nasopharyngeal swab specimens and should not be used as a sole basis for treatment. Nasal washings and aspirates are unacceptable for Xpert Xpress SARS-CoV-2/FLU/RSV testing.  Fact Sheet for Patients: BloggerCourse.com  Fact Sheet for Healthcare Providers: SeriousBroker.it  This test is not yet approved or cleared by the [place] FDA and has been authorized for detection and/or diagnosis of SARS-CoV-2 by FDA under an Emergency Use Authorization (EUA). This EUA will remain in effect (meaning this test can be used) for the duration of the COVID-19 declaration under Section 564(b)(1) of the Act, 21 U.S.C. section 360bbb-3(b)(1), unless the authorization is terminated or revoked.  Performed at [facility], [address] [place], [place] [address]   Aerobic/Anaerobic Culture w Gram Stain (surgical/deep wound)     Status: None (Preliminary result)   Collection Time: 02/06/21 10:35 PM   Specimen: Wound  Result Value Ref Range Status   Specimen Description WOUND  Final   Special Requests RIGHT WRIST WOUND SPEC A  Final   Gram Stain   Final    FEW SQUAMOUS EPITHELIAL CELLS PRESENT MODERATE WBC SEEN FEW GRAM POSITIVE  COCCI    Culture   Final    NO GROWTH 1 DAY Performed at [facility], [address], [place], [place] [address]    Report Status PENDING  Incomplete  Aerobic/Anaerobic Culture w Gram Stain (surgical/deep wound)     Status: None (Preliminary result)   Collection Time: 02/06/21 10:49 PM   Specimen: PATH Soft tissue  Result Value Ref Range Status   Specimen Description TISSUE  Final   Special Requests RIGHT WOUND TISSUE SPEC B  Final   Gram Stain   Final    FEW SQUAMOUS EPITHELIAL CELLS PRESENT FEW WBC SEEN NO ORGANISMS SEEN    Culture   Final    NO GROWTH 1 DAY Performed at [facility], [address], [place], [place] [address]    Report Status PENDING  Incomplete  Culture, blood (routine x 2)     Status: None (Preliminary result)   Collection Time: 02/07/21  8:24 AM   Specimen: BLOOD LEFT WRIST  Result Value Ref Range Status   Specimen Description BLOOD LEFT WRIST  Final   Special Requests   Final    BOTTLES DRAWN AEROBIC AND ANAEROBIC Blood Culture adequate volume   Culture   Final    NO GROWTH 1 DAY Performed at [facility], [address], [place], [place] [address]    Report Status PENDING  Incomplete  Culture, blood (routine x 2)     Status: None (Preliminary result)   Collection Time: 02/07/21  8:29 AM   Specimen: BLOOD RIGHT HAND  Result Value Ref Range Status   Specimen Description BLOOD RIGHT HAND  Final   Special Requests   Final    BOTTLES DRAWN AEROBIC AND ANAEROBIC Blood Culture adequate volume   Culture   Final    NO GROWTH 1 DAY Performed at [facility], [address], [place], [place] [address]    Report Status PENDING  Incomplete  Surgical pcr screen     Status: None   Collection Time: 02/07/21 10:46 PM   Specimen: Nasal Mucosa; Nasal Swab  Result Value Ref Range Status   MRSA, PCR NEGATIVE NEGATIVE Final   Staphylococcus aureus NEGATIVE NEGATIVE Final    Comment: (NOTE) The Xpert SA Assay (FDA approved for NASAL  specimens in patients 55 years of age and older),  is one component of a comprehensive surveillance program. It is not intended to diagnose infection nor to guide or monitor treatment. Performed at [facility], [address], [place], [place] [address]          Radiology Studies: No results found.      Scheduled Meds:  insulin aspart  0-9 Units Subcutaneous TID WC   senna-docusate  1 tablet Oral BID   Continuous Infusions:  acetaminophen     cefTRIAXone (ROCEPHIN)  IV Stopped (02/08/21 1835)   vancomycin Stopped (02/08/21 1835)      Time spent: 45 minutes with more than 50% on COC    Lynn Ito, MD [facility] 02/09/2021, 8:25 AM

## 2021-02-10 DIAGNOSIS — D649 Anemia, unspecified: Secondary | ICD-10-CM

## 2021-02-10 DIAGNOSIS — N179 Acute kidney failure, unspecified: Secondary | ICD-10-CM | POA: Diagnosis not present

## 2021-02-10 DIAGNOSIS — I10 Essential (primary) hypertension: Secondary | ICD-10-CM | POA: Diagnosis not present

## 2021-02-10 DIAGNOSIS — E871 Hypo-osmolality and hyponatremia: Secondary | ICD-10-CM | POA: Diagnosis not present

## 2021-02-10 DIAGNOSIS — M009 Pyogenic arthritis, unspecified: Secondary | ICD-10-CM | POA: Diagnosis not present

## 2021-02-10 DIAGNOSIS — E119 Type 2 diabetes mellitus without complications: Secondary | ICD-10-CM

## 2021-02-10 LAB — IRON AND TIBC
Iron: 27 ug/dL — ABNORMAL LOW (ref 28–170)
Saturation Ratios: 17 % (ref 10.4–31.8)
TIBC: 161 ug/dL — ABNORMAL LOW (ref 250–450)
UIBC: 134 ug/dL

## 2021-02-10 LAB — RETICULOCYTES
Immature Retic Fract: 26.6 % — ABNORMAL HIGH (ref 2.3–15.9)
RBC.: 3.34 MIL/uL — ABNORMAL LOW (ref 3.87–5.11)
Retic Count, Absolute: 35.7 10*3/uL (ref 19.0–186.0)
Retic Ct Pct: 1.1 % (ref 0.4–3.1)

## 2021-02-10 LAB — FOLATE: Folate: 22.8 ng/mL (ref >5.9)

## 2021-02-10 LAB — GLUCOSE, CAPILLARY
Glucose-Capillary: 107 mg/dL — ABNORMAL HIGH (ref 70–99)
Glucose-Capillary: 81 mg/dL (ref 70–99)
Glucose-Capillary: 79 mg/dL (ref 70–99)
Glucose-Capillary: 104 mg/dL — ABNORMAL HIGH (ref 70–99)

## 2021-02-10 LAB — VITAMIN B12: Vitamin B-12: 1420 pg/mL — ABNORMAL HIGH (ref 180–914)

## 2021-02-10 NOTE — Progress Notes (Signed)
THE CHART WAS REVIEWED PLAN FOR REPEAT I/D IN OR TOMORROW AND CLOSURE LIKELY COULD GO HOME TOMORROW IF WOUNDS LOOK GOOD ORDERS PLACED FOR SURGERY TOMORROW

## 2021-02-10 NOTE — Progress Notes (Addendum)
PROGRESS NOTE    Christy Higgins  SWF:[id number]  DOB: 1950/05/23  PCP: Lindaann Pascal, PA-C Admit date:02/06/2021 Chief compliant: rt hand swelling 70 y.o. female with medical history significant of HTN, diabetes mellitus type 2, HLD presented with right hand swelling and pain not improving with outpatient Bactrim.  On presentation, temperature was 99.2 with severe right hand swelling.  WBCs of 30k with creatinine of 2.0.  She was started on broad-spectrum antibiotics for sepsis due to right wrist septic arthritis and right hand multiple deep space abscesses  Hand surgery was consulted.  She underwent surgical debridement on 10/19 and again on 02/08/21. Remains on IV antibiotics including Rocephin/vancomycin.Leukocytosis improving, CRP decreasing AKI/hyponatremia improving with IVF.   Subjective: Patient denies any acute complaints.  She however appears to be concerned about possible repeat procedure in a.m. and asking if orthopedics plans to see her today.  Normotensive, afebrile  Objective: Vitals:   02/09/21 1611 02/09/21 1938 02/10/21 0431 02/10/21 0829  BP: 124/62 137/66 140/62 135/77  Pulse: 72 71 61 70  Resp: 16 19 18 16   Temp: 99.1 F (37.3 C) 98.7 F (37.1 C) 98.1 F (36.7 C) 100.1 F (37.8 C)  TempSrc: Oral Oral Oral Oral  SpO2: 100% 100% 99% 100%  Weight:      Height:        Intake/Output Summary (Last 24 hours) at 02/10/2021 1016 Last data filed at 02/10/2021 0900 Gross per 24 hour  Intake 1185.69 ml  Output --  Net 1185.69 ml   Filed Weights   02/07/21 0900  Weight: 62.2 kg    Physical Examination:  General: Moderately built, no acute distress noted Head ENT: Atraumatic normocephalic, PERRLA, neck supple Heart: S1-S2 heard, regular rate and rhythm, +systolic murmur(congenital per patient).  No leg edema noted Lungs: Equal air entry bilaterally, no rhonchi or rales on exam, no accessory muscle use Abdomen: Bowel sounds heard, soft, nontender, nondistended. No  organomegaly.  No CVA tenderness Extremities: Patient has soft cast along right arm, sensations intact along fingertips and able to flex to some extent.  No pedal edema.  No cyanosis or clubbing. Neurological: Awake alert oriented x3, no focal weakness or numbness, strength and sensations to crude touch intact Skin: No wounds or rashes.     Data Reviewed: I have personally reviewed following labs and imaging studies  CBC: Recent Labs  Lab 02/03/21 1655 02/06/21 1346 02/07/21 0210 02/08/21 0218 02/09/21 0136  WBC 18.4* 30.0* 27.6* 17.7* 13.2*  NEUTROABS 15.3* 28.2*  --  13.9* 9.4*  HGB 10.9* 9.1* 8.4* 8.6* 8.0*  HCT 34.4* 28.4* 25.7* 26.1* 24.8*  MCV 86.9 85.5 84.0 83.7 85.5  PLT 219 243 261 276 276   Basic Metabolic Panel: Recent Labs  Lab 02/03/21 1655 02/06/21 1346 02/07/21 0210 02/08/21 0218 02/09/21 0136  NA 136 130* 134* 138 138  K 3.9 3.6 4.2 4.4 4.1  CL 101 99 106 109 111  CO2 24 21* 20* 22 22  GLUCOSE 101* 71 112* 88 118*  BUN 17 30* 22 15 12   CREATININE 0.81 2.00* 1.42* 0.94 0.86  CALCIUM 9.1 8.4* 8.0* 8.4* 7.9*  MG  --   --   --  2.0 1.8   GFR: Estimated Creatinine Clearance: 50.9 mL/min (by C-G formula based on SCr of 0.86 mg/dL). Liver Function Tests: Recent Labs  Lab 02/03/21 1655 02/06/21 1346  AST 41 26  ALT 37 24  ALKPHOS 90 99  BILITOT 1.1 0.5  PROT 7.7 6.3*  ALBUMIN 2.9* 2.3*  No results for input(s): LIPASE, AMYLASE in the last 168 hours. No results for input(s): AMMONIA in the last 168 hours. Coagulation Profile: No results for input(s): INR, PROTIME in the last 168 hours. Cardiac Enzymes: No results for input(s): CKTOTAL, CKMB, CKMBINDEX, TROPONINI in the last 168 hours. BNP (last 3 results) No results for input(s): PROBNP in the last 8760 hours. HbA1C: No results for input(s): HGBA1C in the last 72 hours. CBG: Recent Labs  Lab 02/09/21 1102 02/09/21 1622 02/09/21 1936 02/09/21 2130 02/10/21 0832  GLUCAP 92 102* 108* 90  107*   Lipid Profile: No results for input(s): CHOL, HDL, LDLCALC, TRIG, CHOLHDL, LDLDIRECT in the last 72 hours. Thyroid Function Tests: No results for input(s): TSH, T4TOTAL, FREET4, T3FREE, THYROIDAB in the last 72 hours. Anemia Panel: No results for input(s): VITAMINB12, FOLATE, FERRITIN, TIBC, IRON, RETICCTPCT in the last 72 hours. Sepsis Labs: Recent Labs  Lab 02/03/21 1655 02/06/21 1445 02/06/21 1546  LATICACIDVEN 1.3 2.3* 1.4    Recent Results (from the past 240 hour(s))  Resp Panel by RT-PCR (Flu A&B, Covid) Nasopharyngeal Swab     Status: None   Collection Time: 02/04/21  1:42 AM   Specimen: Nasopharyngeal Swab; Nasopharyngeal(NP) swabs in vial transport medium  Result Value Ref Range Status   SARS Coronavirus 2 by RT PCR NEGATIVE NEGATIVE Final    Comment: (NOTE) SARS-CoV-2 target nucleic acids are NOT DETECTED.  The SARS-CoV-2 RNA is generally detectable in upper respiratory specimens during the acute phase of infection. The lowest concentration of SARS-CoV-2 viral copies this assay can detect is 138 copies/mL. A negative result does not preclude SARS-Cov-2 infection and should not be used as the sole basis for treatment or other patient management decisions. A negative result may occur with  improper specimen collection/handling, submission of specimen other than nasopharyngeal swab, presence of viral mutation(s) within the areas targeted by this assay, and inadequate number of viral copies(<138 copies/mL). A negative result must be combined with clinical observations, patient history, and epidemiological information. The expected result is Negative.  Fact Sheet for Patients:  BloggerCourse.com  Fact Sheet for Healthcare Providers:  SeriousBroker.it  This test is no t yet approved or cleared by the [place] FDA and  has been authorized for detection and/or diagnosis of SARS-CoV-2 by FDA under an  Emergency Use Authorization (EUA). This EUA will remain  in effect (meaning this test can be used) for the duration of the COVID-19 declaration under Section 564(b)(1) of the Act, 21 U.S.C.section 360bbb-3(b)(1), unless the authorization is terminated  or revoked sooner.       Influenza A by PCR NEGATIVE NEGATIVE Final   Influenza B by PCR NEGATIVE NEGATIVE Final    Comment: (NOTE) The Xpert Xpress SARS-CoV-2/FLU/RSV plus assay is intended as an aid in the diagnosis of influenza from Nasopharyngeal swab specimens and should not be used as a sole basis for treatment. Nasal washings and aspirates are unacceptable for Xpert Xpress SARS-CoV-2/FLU/RSV testing.  Fact Sheet for Patients: BloggerCourse.com  Fact Sheet for Healthcare Providers: SeriousBroker.it  This test is not yet approved or cleared by the [place] FDA and has been authorized for detection and/or diagnosis of SARS-CoV-2 by FDA under an Emergency Use Authorization (EUA). This EUA will remain in effect (meaning this test can be used) for the duration of the COVID-19 declaration under Section 564(b)(1) of the Act, 21 U.S.C. section 360bbb-3(b)(1), unless the authorization is terminated or revoked.  Performed at [facility], [address] [place],  [place] [address]   Resp Panel by RT-PCR (Flu A&B, Covid) Nasopharyngeal Swab     Status: None   Collection Time: 02/06/21  2:31 PM   Specimen: Nasopharyngeal Swab; Nasopharyngeal(NP) swabs in vial transport medium  Result Value Ref Range Status   SARS Coronavirus 2 by RT PCR NEGATIVE NEGATIVE Final    Comment: (NOTE) SARS-CoV-2 target nucleic acids are NOT DETECTED.  The SARS-CoV-2 RNA is generally detectable in upper respiratory specimens during the acute phase of infection. The lowest concentration of SARS-CoV-2 viral copies this assay can detect is 138 copies/mL. A negative result does not preclude  SARS-Cov-2 infection and should not be used as the sole basis for treatment or other patient management decisions. A negative result may occur with  improper specimen collection/handling, submission of specimen other than nasopharyngeal swab, presence of viral mutation(s) within the areas targeted by this assay, and inadequate number of viral copies(<138 copies/mL). A negative result must be combined with clinical observations, patient history, and epidemiological information. The expected result is Negative.  Fact Sheet for Patients:  BloggerCourse.com  Fact Sheet for Healthcare Providers:  SeriousBroker.it  This test is no t yet approved or cleared by the [place] FDA and  has been authorized for detection and/or diagnosis of SARS-CoV-2 by FDA under an Emergency Use Authorization (EUA). This EUA will remain  in effect (meaning this test can be used) for the duration of the COVID-19 declaration under Section 564(b)(1) of the Act, 21 U.S.C.section 360bbb-3(b)(1), unless the authorization is terminated  or revoked sooner.       Influenza A by PCR NEGATIVE NEGATIVE Final   Influenza B by PCR NEGATIVE NEGATIVE Final    Comment: (NOTE) The Xpert Xpress SARS-CoV-2/FLU/RSV plus assay is intended as an aid in the diagnosis of influenza from Nasopharyngeal swab specimens and should not be used as a sole basis for treatment. Nasal washings and aspirates are unacceptable for Xpert Xpress SARS-CoV-2/FLU/RSV testing.  Fact Sheet for Patients: BloggerCourse.com  Fact Sheet for Healthcare Providers: SeriousBroker.it  This test is not yet approved or cleared by the [place] FDA and has been authorized for detection and/or diagnosis of SARS-CoV-2 by FDA under an Emergency Use Authorization (EUA). This EUA will remain in effect (meaning this test can be used) for the duration of  the COVID-19 declaration under Section 564(b)(1) of the Act, 21 U.S.C. section 360bbb-3(b)(1), unless the authorization is terminated or revoked.  Performed at [facility], [address], [place], [place] [address]   Aerobic/Anaerobic Culture w Gram Stain (surgical/deep wound)     Status: None (Preliminary result)   Collection Time: 02/06/21 10:35 PM   Specimen: Wound  Result Value Ref Range Status   Specimen Description WOUND  Final   Special Requests RIGHT WRIST WOUND SPEC A  Final   Gram Stain   Final    FEW SQUAMOUS EPITHELIAL CELLS PRESENT MODERATE WBC SEEN FEW GRAM POSITIVE COCCI    Culture   Final    NO GROWTH 2 DAYS NO ANAEROBES ISOLATED; CULTURE IN PROGRESS FOR 5 DAYS Performed at [facility], [address], [place], [place] [address]    Report Status PENDING  Incomplete  Aerobic/Anaerobic Culture w Gram Stain (surgical/deep wound)     Status: None (Preliminary result)   Collection Time: 02/06/21 10:49 PM   Specimen: PATH Soft tissue  Result Value Ref Range Status   Specimen Description TISSUE  Final   Special Requests RIGHT WOUND TISSUE SPEC B  Final  Gram Stain   Final    FEW SQUAMOUS EPITHELIAL CELLS PRESENT FEW WBC SEEN NO ORGANISMS SEEN    Culture   Final    NO GROWTH 2 DAYS NO ANAEROBES ISOLATED; CULTURE IN PROGRESS FOR 5 DAYS Performed at [facility], [address], [place], [place] [address]    Report Status PENDING  Incomplete  Culture, blood (routine x 2)     Status: None (Preliminary result)   Collection Time: 02/07/21  8:24 AM   Specimen: BLOOD LEFT WRIST  Result Value Ref Range Status   Specimen Description BLOOD LEFT WRIST  Final   Special Requests   Final    BOTTLES DRAWN AEROBIC AND ANAEROBIC Blood Culture adequate volume   Culture   Final    NO GROWTH 2 DAYS Performed at [facility], [address] [place], [place] [address]    Report Status PENDING  Incomplete  Culture, blood (routine x 2)     Status: None  (Preliminary result)   Collection Time: 02/07/21  8:29 AM   Specimen: BLOOD RIGHT HAND  Result Value Ref Range Status   Specimen Description BLOOD RIGHT HAND  Final   Special Requests   Final    BOTTLES DRAWN AEROBIC AND ANAEROBIC Blood Culture adequate volume   Culture   Final    NO GROWTH 2 DAYS Performed at [facility], [address], [place], [place] [address]    Report Status PENDING  Incomplete  Surgical pcr screen     Status: None   Collection Time: 02/07/21 10:46 PM   Specimen: Nasal Mucosa; Nasal Swab  Result Value Ref Range Status   MRSA, PCR NEGATIVE NEGATIVE Final   Staphylococcus aureus NEGATIVE NEGATIVE Final    Comment: (NOTE) The Xpert SA Assay (FDA approved for NASAL specimens in patients 59 years of age and older), is one component of a comprehensive surveillance program. It is not intended to diagnose infection nor to guide or monitor treatment. Performed at [facility], [address], [place], [place] [address]       Radiology Studies: No results found.    Scheduled Meds:  insulin aspart  0-9 Units Subcutaneous TID WC   senna-docusate  1 tablet Oral BID   Continuous Infusions:  cefTRIAXone (ROCEPHIN)  IV 2 g (02/09/21 1857)   lactated ringers 75 mL/hr at 02/10/21 0546   vancomycin 750 mg (02/09/21 1455)      Assessment/Plan:  1.Septic arthritis/multiple hand abscesses with sepsis : POA. Now improving WBC (30K->13K) and CRP . Blood CX -VE and OR cx from 10/19 -ve so far. Remains on Vancomycin, ceftrizone. Will check MRSA screen and narrow abx accordingly. CBC in am. ?  Plan for repeat or debridement in AM-request follow-up recommendations by orthopedics.  2.  AKI: POA in the setting of #1. Cr AT 2.0 on presentation--now resolved and cr at 0.8  3. Hyponatremia: improved with IVF. Will d/c fluids. Encourage oral fluids  4. HTN: BP low nl yesterday, improved to SBP 130-140 today . D/C IVF and monitor  5. Diabetes Type 2: Metformin  held on admission with #1 and #2. On SSI. Resume on discharge  6. Normocytic anemia: in the setting of acute illness. Hgb 12 at baseline. On admission 10.9 , has down trended to 8.0 with IVF -Monitor H&H and obtain anemia w/u   DVT prophylaxis:  SCD Code Status: Full code Family / Patient Communication: D/W Patient Disposition Plan:   Status is:  Inpatient Time spent: 25 min     >50% time spent in discussions with care team and coordination of care.    Alessandra Bevels, MD [facility] Pager in [place]  If 7PM-7AM, please contact night-coverage [email or web address] 02/10/2021, 10:16 AM

## 2021-02-11 ENCOUNTER — Inpatient Hospital Stay (HOSPITAL_COMMUNITY): Payer: BC Managed Care – PPO | Admitting: Anesthesiology

## 2021-02-11 ENCOUNTER — Encounter (HOSPITAL_COMMUNITY): Payer: Self-pay | Admitting: Internal Medicine

## 2021-02-11 ENCOUNTER — Encounter (HOSPITAL_COMMUNITY)
Admission: EM | Disposition: A | Discharge status: Home / Self Care | Payer: Self-pay | Source: Home / Self Care | Attending: Internal Medicine

## 2021-02-11 DIAGNOSIS — M19031 Primary osteoarthritis, right wrist: Secondary | ICD-10-CM | POA: Diagnosis not present

## 2021-02-11 DIAGNOSIS — M009 Pyogenic arthritis, unspecified: Secondary | ICD-10-CM | POA: Diagnosis not present

## 2021-02-11 HISTORY — PX: I & D EXTREMITY: SHX5045

## 2021-02-11 LAB — BASIC METABOLIC PANEL
Anion gap: 7 (ref 5–15)
BUN: 5 mg/dL — ABNORMAL LOW (ref 8–23)
CO2: 23 mmol/L (ref 22–32)
Calcium: 8.1 mg/dL — ABNORMAL LOW (ref 8.9–10.3)
Chloride: 111 mmol/L (ref 98–111)
Creatinine, Ser: 0.59 mg/dL (ref 0.44–1.00)
GFR, Estimated: >60 mL/min (ref >60)
Glucose, Bld: 118 mg/dL — ABNORMAL HIGH (ref 70–99)
Potassium: 3.7 mmol/L (ref 3.5–5.1)
Sodium: 141 mmol/L (ref 135–145)

## 2021-02-11 LAB — CBC
HCT: 24.9 % — ABNORMAL LOW (ref 36.0–46.0)
Hemoglobin: 8 g/dL — ABNORMAL LOW (ref 12.0–15.0)
MCH: 27.8 pg (ref 26.0–34.0)
MCHC: 32.1 g/dL (ref 30.0–36.0)
MCV: 86.5 fL (ref 80.0–100.0)
Platelets: 347 10*3/uL (ref 150–400)
RBC: 2.88 MIL/uL — ABNORMAL LOW (ref 3.87–5.11)
RDW: 15.3 % (ref 11.5–15.5)
WBC: 10.6 10*3/uL — ABNORMAL HIGH (ref 4.0–10.5)
nRBC: 0 % (ref 0.0–0.2)

## 2021-02-11 LAB — GLUCOSE, CAPILLARY
Glucose-Capillary: 89 mg/dL (ref 70–99)
Glucose-Capillary: 72 mg/dL (ref 70–99)
Glucose-Capillary: 108 mg/dL — ABNORMAL HIGH (ref 70–99)
Glucose-Capillary: 71 mg/dL (ref 70–99)
Glucose-Capillary: 73 mg/dL (ref 70–99)
Glucose-Capillary: 73 mg/dL (ref 70–99)
Glucose-Capillary: 87 mg/dL (ref 70–99)
Glucose-Capillary: 129 mg/dL — ABNORMAL HIGH (ref 70–99)

## 2021-02-11 SURGERY — IRRIGATION AND DEBRIDEMENT EXTREMITY
Anesthesia: General | Laterality: Right

## 2021-02-11 MED ORDER — ACETAMINOPHEN 500 MG PO TABS
1000.0000 mg | ORAL_TABLET | Freq: Once | ORAL | Status: AC
  Administered 2021-02-11: 1000 mg via ORAL
  Filled 2021-02-11: qty 2

## 2021-02-11 MED ORDER — BUPIVACAINE HCL (PF) 0.25 % IJ SOLN
INTRAMUSCULAR | Status: AC
  Filled 2021-02-11: qty 30

## 2021-02-11 MED ORDER — PROPOFOL 10 MG/ML IV BOLUS
INTRAVENOUS | Status: DC | PRN
  Administered 2021-02-11: 120 mg via INTRAVENOUS
  Administered 2021-02-11: 80 mg via INTRAVENOUS

## 2021-02-11 MED ORDER — FENTANYL CITRATE (PF) 100 MCG/2ML IJ SOLN
INTRAMUSCULAR | Status: DC | PRN
  Administered 2021-02-11 (×2): 50 ug via INTRAVENOUS

## 2021-02-11 MED ORDER — ONDANSETRON HCL 4 MG/2ML IJ SOLN
INTRAMUSCULAR | Status: DC | PRN
  Administered 2021-02-11: 4 mg via INTRAVENOUS

## 2021-02-11 MED ORDER — LACTATED RINGERS IV SOLN: INTRAVENOUS | Status: DC

## 2021-02-11 MED ORDER — FENTANYL CITRATE (PF) 250 MCG/5ML IJ SOLN
INTRAMUSCULAR | Status: AC
  Filled 2021-02-11: qty 5

## 2021-02-11 MED ORDER — CHLORHEXIDINE GLUCONATE 4 % EX LIQD: 60.0000 mL | Freq: Once | CUTANEOUS | Status: DC

## 2021-02-11 MED ORDER — 0.9 % SODIUM CHLORIDE (POUR BTL) OPTIME
TOPICAL | Status: DC | PRN
  Administered 2021-02-11: 1000 mL

## 2021-02-11 MED ORDER — SODIUM CHLORIDE 0.9 % IR SOLN
Status: DC | PRN
  Administered 2021-02-11: 3000 mL

## 2021-02-11 MED ORDER — DEXTROSE 50 % IV SOLN
INTRAVENOUS | Status: AC
  Filled 2021-02-11: qty 50

## 2021-02-11 MED ORDER — POVIDONE-IODINE 10 % EX SWAB
2.0000 "application " | Freq: Once | CUTANEOUS | Status: AC
  Administered 2021-02-11: 2 via TOPICAL

## 2021-02-11 MED ORDER — ORAL CARE MOUTH RINSE: 15.0000 mL | Freq: Once | OROMUCOSAL | Status: AC

## 2021-02-11 MED ORDER — CHLORHEXIDINE GLUCONATE 0.12 % MT SOLN: 15.0000 mL | Freq: Once | OROMUCOSAL | Status: AC

## 2021-02-11 MED ORDER — DEXTROSE 50 % IV SOLN
25.0000 mL | Freq: Once | INTRAVENOUS | Status: AC
  Administered 2021-02-11: 25 mL via INTRAVENOUS

## 2021-02-11 MED ORDER — LIDOCAINE 2% (20 MG/ML) 5 ML SYRINGE
INTRAMUSCULAR | Status: DC | PRN
Start: 2021-02-11 — End: 2021-02-11
  Administered 2021-02-11: 80 mg via INTRAVENOUS

## 2021-02-11 MED ORDER — CHLORHEXIDINE GLUCONATE 0.12 % MT SOLN
OROMUCOSAL | Status: AC
  Administered 2021-02-11: 15 mL
  Filled 2021-02-11: qty 15

## 2021-02-11 MED ORDER — PROPOFOL 10 MG/ML IV BOLUS
INTRAVENOUS | Status: AC
  Filled 2021-02-11: qty 20

## 2021-02-11 SURGICAL SUPPLY — 64 items
BAG COUNTER SPONGE SURGICOUNT (BAG) ×2 IMPLANT
BAG SPNG CNTER NS LX DISP (BAG) ×1
BNDG CMPR 9X4 STRL LF SNTH (GAUZE/BANDAGES/DRESSINGS) ×1
BNDG COHESIVE 1X5 TAN STRL LF (GAUZE/BANDAGES/DRESSINGS) IMPLANT
BNDG CONFORM 2 STRL LF (GAUZE/BANDAGES/DRESSINGS) IMPLANT
BNDG ELASTIC 3X5.8 VLCR STR LF (GAUZE/BANDAGES/DRESSINGS) ×2 IMPLANT
BNDG ELASTIC 4X5.8 VLCR STR LF (GAUZE/BANDAGES/DRESSINGS) ×2 IMPLANT
BNDG ESMARK 4X9 LF (GAUZE/BANDAGES/DRESSINGS) ×2 IMPLANT
BNDG GAUZE ELAST 4 BULKY (GAUZE/BANDAGES/DRESSINGS) ×2 IMPLANT
CORD BIPOLAR FORCEPS 12FT (ELECTRODE) ×2 IMPLANT
COVER SURGICAL LIGHT HANDLE (MISCELLANEOUS) ×2 IMPLANT
CUFF TOURN SGL QUICK 18X4 (TOURNIQUET CUFF) ×2 IMPLANT
CUFF TOURN SGL QUICK 24 (TOURNIQUET CUFF)
CUFF TRNQT CYL 24X4X16.5-23 (TOURNIQUET CUFF) IMPLANT
DRAIN PENROSE 1/4X12 LTX STRL (WOUND CARE) IMPLANT
DRAPE SURG 17X23 STRL (DRAPES) ×2 IMPLANT
DRSG ADAPTIC 3X8 NADH LF (GAUZE/BANDAGES/DRESSINGS) ×2 IMPLANT
ELECT REM PT RETURN 9FT ADLT (ELECTROSURGICAL)
ELECTRODE REM PT RTRN 9FT ADLT (ELECTROSURGICAL) IMPLANT
GAUZE SPONGE 4X4 12PLY STRL (GAUZE/BANDAGES/DRESSINGS) ×3 IMPLANT
GAUZE XEROFORM 1X8 LF (GAUZE/BANDAGES/DRESSINGS) ×1 IMPLANT
GAUZE XEROFORM 5X9 LF (GAUZE/BANDAGES/DRESSINGS) IMPLANT
GLOVE SURG ORTHO LTX SZ8 (GLOVE) ×2 IMPLANT
GLOVE SURG UNDER POLY LF SZ8.5 (GLOVE) ×2 IMPLANT
GOWN STRL REUS W/ TWL LRG LVL3 (GOWN DISPOSABLE) ×3 IMPLANT
GOWN STRL REUS W/ TWL XL LVL3 (GOWN DISPOSABLE) ×1 IMPLANT
GOWN STRL REUS W/TWL LRG LVL3 (GOWN DISPOSABLE) ×6
GOWN STRL REUS W/TWL XL LVL3 (GOWN DISPOSABLE) ×2
HANDPIECE INTERPULSE COAX TIP (DISPOSABLE)
KIT BASIN OR (CUSTOM PROCEDURE TRAY) ×2 IMPLANT
KIT TURNOVER KIT B (KITS) ×2 IMPLANT
MANIFOLD NEPTUNE II (INSTRUMENTS) ×2 IMPLANT
NDL HYPO 25GX1X1/2 BEV (NEEDLE) IMPLANT
NEEDLE HYPO 25GX1X1/2 BEV (NEEDLE) IMPLANT
NS IRRIG 1000ML POUR BTL (IV SOLUTION) ×2 IMPLANT
PACK ORTHO EXTREMITY (CUSTOM PROCEDURE TRAY) ×2 IMPLANT
PAD ARMBOARD 7.5X6 YLW CONV (MISCELLANEOUS) ×4 IMPLANT
PAD CAST 3X4 CTTN HI CHSV (CAST SUPPLIES) IMPLANT
PAD CAST 4YDX4 CTTN HI CHSV (CAST SUPPLIES) ×1 IMPLANT
PADDING CAST COTTON 3X4 STRL (CAST SUPPLIES) ×4
PADDING CAST COTTON 4X4 STRL (CAST SUPPLIES) ×2
SET CYSTO W/LG BORE CLAMP LF (SET/KITS/TRAYS/PACK) ×1 IMPLANT
SET HNDPC FAN SPRY TIP SCT (DISPOSABLE) IMPLANT
SOAP 2 % CHG 4 OZ (WOUND CARE) ×2 IMPLANT
SPLINT FIBERGLASS 3X12 (CAST SUPPLIES) ×1 IMPLANT
SPONGE T-LAP 18X18 ~~LOC~~+RFID (SPONGE) ×2 IMPLANT
SPONGE T-LAP 4X18 ~~LOC~~+RFID (SPONGE) ×2 IMPLANT
SUT ETHILON 4 0 PS 2 18 (SUTURE) ×1 IMPLANT
SUT ETHILON 5 0 P 3 18 (SUTURE)
SUT MNCRL AB 3-0 PS2 18 (SUTURE) ×1 IMPLANT
SUT MNCRL AB 4-0 PS2 18 (SUTURE) ×1 IMPLANT
SUT MNCRL+ AB 3-0 CT1 36 (SUTURE) IMPLANT
SUT MONOCRYL AB 3-0 CT1 36IN (SUTURE) ×2
SUT NYLON ETHILON 5-0 P-3 1X18 (SUTURE) IMPLANT
SUT PROLENE 4 0 PS 2 18 (SUTURE) ×3 IMPLANT
SWAB COLLECTION DEVICE MRSA (MISCELLANEOUS) ×2 IMPLANT
SWAB CULTURE ESWAB REG 1ML (MISCELLANEOUS) IMPLANT
SYR CONTROL 10ML LL (SYRINGE) IMPLANT
TOWEL GREEN STERILE (TOWEL DISPOSABLE) ×2 IMPLANT
TOWEL GREEN STERILE FF (TOWEL DISPOSABLE) ×2 IMPLANT
TUBE CONNECTING 12X1/4 (SUCTIONS) ×2 IMPLANT
UNDERPAD 30X36 HEAVY ABSORB (UNDERPADS AND DIAPERS) ×2 IMPLANT
WATER STERILE IRR 1000ML POUR (IV SOLUTION) ×2 IMPLANT
YANKAUER SUCT BULB TIP NO VENT (SUCTIONS) ×2 IMPLANT

## 2021-02-11 NOTE — Progress Notes (Signed)
Pharmacy Antibiotic Note  Christy Higgins is a 70 y.o. female admitted on 10/16 2022 with deep wrist abscess (right) (septic arthritis). Pharmacy has been consulted for vancomycin dosing.  Plan: - Vanc to 750 mg/24h (eAUC 406, SCr 0.86, Vd 0.72)  - Cont CTX 2g/24h per MD - F/U finalized Cx for de-escalation  - Repeat I&D today with plans to possibly discharge post op. Will hold off on vanc level for now    Temp (24hrs), Avg:98.9 F (37.2 C), Min:98.7 F (37.1 C), Max:99.1 F (37.3 C)  Recent Labs  Lab 02/06/21 1346 02/06/21 1445 02/06/21 1546 02/07/21 0210 02/07/21 1134 02/08/21 0218 02/09/21 0136 02/11/21 0050  WBC 30.0*  --   --  27.6*  --  17.7* 13.2* 10.6*  CREATININE 2.00*  --   --  1.42*  --  0.94 0.86 0.59  LATICACIDVEN  --  2.3* 1.4  --   --   --   --   --   VANCORANDOM  --   --   --   --  6  --   --   --      Estimated Creatinine Clearance: 54.7 mL/min (by C-G formula based on SCr of 0.59 mg/dL).    No Known Allergies   Microbiology results: 10/19 R-wrist tissue cx A >> few GPC on GS >> NGTD 10/19 R-wrist tissue cx B >> No organisms seen on GS>> NGTD 10/20 COVID/flu >> neg 10/20: MRSA PCR >> neg 10/20 BCx >> NGTD   Thank you for allowing pharmacy to be a part of this patient's care.  Vinnie Level, PharmD., BCPS, BCCCP Clinical Pharmacist Please refer to [facility] for unit-specific pharmacist

## 2021-02-11 NOTE — Op Note (Signed)
OPERATIVE NOTE   DATE OF PROCEDURE: 02/11/2021   SURGEONS:  Dr. Iran Planas who scrubbed and present for the entire procedure   PREOPERATIVE DIAGNOSIS: Right hand and wrist infection, septic arthritis   POSTOPERATIVE DIAGNOSIS: Same   NAME OF PROCEDURE:  Right wrist arthrotomy with exploration and drainage [id number] Right wrist arthrotomy with exploration and drainage dorsal -[id number]   ANESTHESIA: General   SKIN PREPARATION: Hibiclens   ESTIMATED BLOOD LOSS: Minimal   IMPLANTS: none   INDICATIONS:  Christy Higgins is a 70 y.o. female who has the above preoperative diagnosis. The patient has decided to proceed with surgical intervention.  Risks, benefits and alternatives of operative management were discussed including, but not limited to, risks of anesthesia complications, infection, pain, persistent symptoms, stiffness, need for future surgery.  The patient understands, agrees and elects to proceed with surgery.     DESCRIPTION OF PROCEDURE: The patient was met in the pre-operative area and their identity was verified.  The operative location and laterality was also verified and marked.  The patient was brought to the OR and was placed supine on the table.  After repeat patient identification with the operative team anesthesia was provided and the patient was prepped and draped in the usual sterile fashion.  A final timeout was performed verifying the correction patient, procedure, location and laterality.  Patient been on preoperative antibiotics.  The right upper extremities then prepped and draped normal sterile fashion.  The tourniquet insufflated.The previous sutures were removed with surgery began by incision through the skin and subcutaneous tissues  at the distal forearm and wrist and  drainage of deep abscess of the wrist and distal forearm was performed.  The neurovascular structures were protected throughout the incision and drainage and all purulent material was expressed and removed.   Thorough irrigation with normal saline via low flow cystoscopy tubing was performed down to the level of the distal radial ulnar joint.  At this time attention was turned to the right hand where the palmar bursae were entered down through the carpal tunnel.  Thorough wound irrigation was done both volarly and dorsally.  The volar wrist had no remaining purulence and the tissues remained viable.  At this time attention was then turned to the dorsal incision which previous sutures were removed and skin and subcutaneous tissues were divided down to the level of the fourth extensor compartment.  Her tendons were protected and the level of the carpometacarpal joints and the dorsal radiocarpal articulation an incision and drainage of the deep abscess was performed.  The tissue appeared to be in good condition there is no remaining purulence at this level.  The retinaculum was closed dorsally with Monocryl suture.  The skin was then closed with simple and horizontal mattress Prolene sutures.  Adaptic dressing a sterile compressive bandage then applied.  The patient was placed in a well-padded volar splint patient tolerated the procedure well was brought to PACU for recovery in stable condition.  In the case all counts were correct x2.   Postoperative plan: Patient be admitted back to the internal medicine service.  The patient will need to go home on an antibiotic regimen.  May consider consulting infectious disease given the significant infection and involvement of the joint.  The patient had multijoint level involvement and may require long-term IV antibiotics for several weeks.  Otherwise if oral antibiotics are prescribed or the patient is ready and can go home on IV antibiotics with home health okay for the patient to go  home when once arranged.  I will need to see the patient back next Tuesday in the office keep the splint on the entire time.  Please contact me if any issues or concerns arise.

## 2021-02-11 NOTE — Progress Notes (Signed)
Pt blood sugar is 72 at 1126, notify Dr. Jerral Ralph, he order dextrose 25%, recheck pt blood sugar and now 108. Will continue to monitor.

## 2021-02-11 NOTE — Anesthesia Postprocedure Evaluation (Signed)
Anesthesia Post Note  Patient: Christy Higgins  Procedure(s) Performed: IRRIGATION AND DEBRIDEMENT RIGHT WRIST (Right)     Patient location during evaluation: PACU Anesthesia Type: General Level of consciousness: patient cooperative, oriented and sedated Pain management: pain level controlled Vital Signs Assessment: post-procedure vital signs reviewed and stable Respiratory status: spontaneous breathing, nonlabored ventilation and respiratory function stable Cardiovascular status: blood pressure returned to baseline and stable Postop Assessment: no apparent nausea or vomiting Anesthetic complications: no   No notable events documented.  Last Vitals:  Vitals:   02/11/21 1808 02/11/21 1823  BP: (!) 152/99 134/84  Pulse: 98 87  Resp: 20 16  Temp: 36.4 C 37.1 C  SpO2: 96% 97%    Last Pain:  Vitals:   02/11/21 1823  TempSrc: Oral  PainSc:                  Kapono Luhn,E. Wendie Diskin

## 2021-02-11 NOTE — Transfer of Care (Signed)
Immediate Anesthesia Transfer of Care Note  Patient: Christy Higgins  Procedure(s) Performed: IRRIGATION AND DEBRIDEMENT RIGHT WRIST (Right)  Patient Location: PACU  Anesthesia Type:General  Level of Consciousness: awake, alert  and oriented  Airway & Oxygen Therapy: Patient Spontanous Breathing  Post-op Assessment: Report given to RN and Post -op Vital signs reviewed and stable  Post vital signs: Reviewed and stable  Last Vitals:  Vitals Value Taken Time  BP 121/79 02/11/21 1746  Temp 36.1 C 02/11/21 1746  Pulse 111 02/11/21 1747  Resp 23 02/11/21 1747  SpO2 95 % 02/11/21 1747  Vitals shown include unvalidated device data.  Last Pain:  Vitals:   02/11/21 1610  TempSrc: Oral  PainSc: 2       Patients Stated Pain Goal: 3 (02/10/21 2055)  Complications: No notable events documented.

## 2021-02-11 NOTE — Progress Notes (Signed)
Mobility Specialist Progress Note    02/11/21 1208  Mobility  Activity Ambulated in hall  Level of Assistance Independent after set-up  Assistive Device None  Distance Ambulated (ft) 560 ft  Mobility Ambulated independently in hallway  Mobility Response Tolerated well  Mobility performed by Mobility specialist  Bed Position Chair  $Mobility charge 1 Mobility   Pt received in bed and agreeable. Only complained of pain in R wrist. Returned to BR then chair with call bell in reach.    [place] Mobility Specialist  Mobility Specialist Phone: [phone]

## 2021-02-11 NOTE — Progress Notes (Signed)
Patient was seen and evaluated today.  Signed informed consent was obtained.  Plan on proceeding for repeat irrigation and debridement and likely closure.  Patient voiced understand the reason the rationale for the repeat I&D.

## 2021-02-11 NOTE — Anesthesia Procedure Notes (Signed)
Procedure Name: LMA Insertion Date/Time: 02/11/2021 4:46 PM Performed by: Sheppard Evens, CRNA Pre-anesthesia Checklist: Patient identified, Emergency Drugs available, Suction available and Patient being monitored Patient Re-evaluated:Patient Re-evaluated prior to induction Oxygen Delivery Method: Circle System Utilized Preoxygenation: Pre-oxygenation with 100% oxygen Induction Type: IV induction Ventilation: Mask ventilation without difficulty LMA: LMA inserted LMA Size: 4.0 Number of attempts: 1 Airway Equipment and Method: Bite block Placement Confirmation: positive ETCO2 Tube secured with: Tape Dental Injury: Teeth and Oropharynx as per pre-operative assessment

## 2021-02-11 NOTE — Progress Notes (Signed)
PROGRESS NOTE    Christy Higgins  HLK:[id number]  DOB: 1950-06-16  PCP: Lindaann Pascal, PA-C Admit date:02/06/2021 Chief compliant: rt hand swelling 70 y.o. female with medical history significant of HTN, diabetes mellitus type 2, HLD presented with right hand swelling and pain not improving with outpatient Bactrim.  On presentation, temperature was 99.2 with severe right hand swelling.  WBCs of 30k with creatinine of 2.0.  She was started on broad-spectrum antibiotics for sepsis due to right wrist septic arthritis and right hand multiple deep space abscesses  Hand surgery was consulted.    She underwent surgical debridement on 10/19 and again on 02/08/21. Remains on IV antibiotics including Rocephin/vancomycin.Leukocytosis improving, CRP decreasing AKI/hyponatremia improving with IVF.   Subjective: Patient seen and examined.  No complaints.  Waiting to go to surgery.  Objective: Vitals:   02/10/21 1600 02/10/21 1921 02/11/21 0542 02/11/21 0709  BP: (!) 162/68 117/64 (!) 143/71 (!) 148/74  Pulse: 60 73 70 69  Resp: 18 17 16 16   Temp: 98.9 F (37.2 C) 98.7 F (37.1 C) 98.9 F (37.2 C) 99.1 F (37.3 C)  TempSrc: Oral Oral Oral Oral  SpO2: 100% 97% 97% 97%  Weight:      Height:       No intake or output data in the 24 hours ending 02/11/21 1354  Filed Weights   02/07/21 0900  Weight: 62.2 kg    Physical Examination:  General: Looks comfortable.  On room air. Cardiovascular: S1-S2 normal. Respiratory: Bilateral clear. Gastrointestinal: Soft.  Nontender.  Bowel sound present. Ext: Right wrist on compression dressing, swelling but intact neurovascular status distal fingertips.      Data Reviewed: I have personally reviewed following labs and imaging studies  CBC: Recent Labs  Lab 02/06/21 1346 02/07/21 0210 02/08/21 0218 02/09/21 0136 02/11/21 0050  WBC 30.0* 27.6* 17.7* 13.2* 10.6*  NEUTROABS 28.2*  --  13.9* 9.4*  --   HGB 9.1* 8.4* 8.6* 8.0* 8.0*  HCT 28.4*  25.7* 26.1* 24.8* 24.9*  MCV 85.5 84.0 83.7 85.5 86.5  PLT 243 261 276 276 347   Basic Metabolic Panel: Recent Labs  Lab 02/06/21 1346 02/07/21 0210 02/08/21 0218 02/09/21 0136 02/11/21 0050  NA 130* 134* 138 138 141  K 3.6 4.2 4.4 4.1 3.7  CL 99 106 109 111 111  CO2 21* 20* 22 22 23   GLUCOSE 71 112* 88 118* 118*  BUN 30* 22 15 12  5*  CREATININE 2.00* 1.42* 0.94 0.86 0.59  CALCIUM 8.4* 8.0* 8.4* 7.9* 8.1*  MG  --   --  2.0 1.8  --    GFR: Estimated Creatinine Clearance: 54.7 mL/min (by C-G formula based on SCr of 0.59 mg/dL). Liver Function Tests: Recent Labs  Lab 02/06/21 1346  AST 26  ALT 24  ALKPHOS 99  BILITOT 0.5  PROT 6.3*  ALBUMIN 2.3*   No results for input(s): LIPASE, AMYLASE in the last 168 hours. No results for input(s): AMMONIA in the last 168 hours. Coagulation Profile: No results for input(s): INR, PROTIME in the last 168 hours. Cardiac Enzymes: No results for input(s): CKTOTAL, CKMB, CKMBINDEX, TROPONINI in the last 168 hours. BNP (last 3 results) No results for input(s): PROBNP in the last 8760 hours. HbA1C: No results for input(s): HGBA1C in the last 72 hours. CBG: Recent Labs  Lab 02/10/21 1555 02/10/21 2205 02/11/21 0744 02/11/21 1102 02/11/21 1228  GLUCAP 79 104* 89 72 108*   Lipid Profile: No results for input(s): CHOL, HDL, LDLCALC, TRIG,  CHOLHDL, LDLDIRECT in the last 72 hours. Thyroid Function Tests: No results for input(s): TSH, T4TOTAL, FREET4, T3FREE, THYROIDAB in the last 72 hours. Anemia Panel: Recent Labs    02/10/21 1043  VITAMINB12 1,420*  FOLATE 22.8  TIBC 161*  IRON 27*  RETICCTPCT 1.1   Sepsis Labs: Recent Labs  Lab 02/06/21 1445 02/06/21 1546  LATICACIDVEN 2.3* 1.4    Recent Results (from the past 240 hour(s))  Resp Panel by RT-PCR (Flu A&B, Covid) Nasopharyngeal Swab     Status: None   Collection Time: 02/04/21  1:42 AM   Specimen: Nasopharyngeal Swab; Nasopharyngeal(NP) swabs in vial transport medium   Result Value Ref Range Status   SARS Coronavirus 2 by RT PCR NEGATIVE NEGATIVE Final    Comment: (NOTE) SARS-CoV-2 target nucleic acids are NOT DETECTED.  The SARS-CoV-2 RNA is generally detectable in upper respiratory specimens during the acute phase of infection. The lowest concentration of SARS-CoV-2 viral copies this assay can detect is 138 copies/mL. A negative result does not preclude SARS-Cov-2 infection and should not be used as the sole basis for treatment or other patient management decisions. A negative result may occur with  improper specimen collection/handling, submission of specimen other than nasopharyngeal swab, presence of viral mutation(s) within the areas targeted by this assay, and inadequate number of viral copies(<138 copies/mL). A negative result must be combined with clinical observations, patient history, and epidemiological information. The expected result is Negative.  Fact Sheet for Patients:  BloggerCourse.com  Fact Sheet for Healthcare Providers:  SeriousBroker.it  This test is no t yet approved or cleared by the [place] FDA and  has been authorized for detection and/or diagnosis of SARS-CoV-2 by FDA under an Emergency Use Authorization (EUA). This EUA will remain  in effect (meaning this test can be used) for the duration of the COVID-19 declaration under Section 564(b)(1) of the Act, 21 U.S.C.section 360bbb-3(b)(1), unless the authorization is terminated  or revoked sooner.       Influenza A by PCR NEGATIVE NEGATIVE Final   Influenza B by PCR NEGATIVE NEGATIVE Final    Comment: (NOTE) The Xpert Xpress SARS-CoV-2/FLU/RSV plus assay is intended as an aid in the diagnosis of influenza from Nasopharyngeal swab specimens and should not be used as a sole basis for treatment. Nasal washings and aspirates are unacceptable for Xpert Xpress SARS-CoV-2/FLU/RSV testing.  Fact Sheet for  Patients: BloggerCourse.com  Fact Sheet for Healthcare Providers: SeriousBroker.it  This test is not yet approved or cleared by the [place] FDA and has been authorized for detection and/or diagnosis of SARS-CoV-2 by FDA under an Emergency Use Authorization (EUA). This EUA will remain in effect (meaning this test can be used) for the duration of the COVID-19 declaration under Section 564(b)(1) of the Act, 21 U.S.C. section 360bbb-3(b)(1), unless the authorization is terminated or revoked.  Performed at [facility], [address] [place], [place] [address]   Resp Panel by RT-PCR (Flu A&B, Covid) Nasopharyngeal Swab     Status: None   Collection Time: 02/06/21  2:31 PM   Specimen: Nasopharyngeal Swab; Nasopharyngeal(NP) swabs in vial transport medium  Result Value Ref Range Status   SARS Coronavirus 2 by RT PCR NEGATIVE NEGATIVE Final    Comment: (NOTE) SARS-CoV-2 target nucleic acids are NOT DETECTED.  The SARS-CoV-2 RNA is generally detectable in upper respiratory specimens during the acute phase of infection. The lowest concentration of SARS-CoV-2 viral copies this assay can detect is 138 copies/mL. A negative result does  not preclude SARS-Cov-2 infection and should not be used as the sole basis for treatment or other patient management decisions. A negative result may occur with  improper specimen collection/handling, submission of specimen other than nasopharyngeal swab, presence of viral mutation(s) within the areas targeted by this assay, and inadequate number of viral copies(<138 copies/mL). A negative result must be combined with clinical observations, patient history, and epidemiological information. The expected result is Negative.  Fact Sheet for Patients:  BloggerCourse.com  Fact Sheet for Healthcare Providers:  SeriousBroker.it  This test is no t yet  approved or cleared by the [place] FDA and  has been authorized for detection and/or diagnosis of SARS-CoV-2 by FDA under an Emergency Use Authorization (EUA). This EUA will remain  in effect (meaning this test can be used) for the duration of the COVID-19 declaration under Section 564(b)(1) of the Act, 21 U.S.C.section 360bbb-3(b)(1), unless the authorization is terminated  or revoked sooner.       Influenza A by PCR NEGATIVE NEGATIVE Final   Influenza B by PCR NEGATIVE NEGATIVE Final    Comment: (NOTE) The Xpert Xpress SARS-CoV-2/FLU/RSV plus assay is intended as an aid in the diagnosis of influenza from Nasopharyngeal swab specimens and should not be used as a sole basis for treatment. Nasal washings and aspirates are unacceptable for Xpert Xpress SARS-CoV-2/FLU/RSV testing.  Fact Sheet for Patients: BloggerCourse.com  Fact Sheet for Healthcare Providers: SeriousBroker.it  This test is not yet approved or cleared by the [place] FDA and has been authorized for detection and/or diagnosis of SARS-CoV-2 by FDA under an Emergency Use Authorization (EUA). This EUA will remain in effect (meaning this test can be used) for the duration of the COVID-19 declaration under Section 564(b)(1) of the Act, 21 U.S.C. section 360bbb-3(b)(1), unless the authorization is terminated or revoked.  Performed at [facility], [address] [place], [place] [address]   Aerobic/Anaerobic Culture w Gram Stain (surgical/deep wound)     Status: None (Preliminary result)   Collection Time: 02/06/21 10:35 PM   Specimen: Wound  Result Value Ref Range Status   Specimen Description WOUND  Final   Special Requests RIGHT WRIST WOUND SPEC A  Final   Gram Stain   Final    FEW SQUAMOUS EPITHELIAL CELLS PRESENT MODERATE WBC SEEN FEW GRAM POSITIVE COCCI    Culture   Final    NO GROWTH 4 DAYS NO ANAEROBES ISOLATED; CULTURE IN PROGRESS FOR 5  DAYS Performed at [facility], [address], [place], [place] [address]    Report Status PENDING  Incomplete  Aerobic/Anaerobic Culture w Gram Stain (surgical/deep wound)     Status: None (Preliminary result)   Collection Time: 02/06/21 10:49 PM   Specimen: PATH Soft tissue  Result Value Ref Range Status   Specimen Description TISSUE  Final   Special Requests RIGHT WOUND TISSUE SPEC B  Final   Gram Stain   Final    FEW SQUAMOUS EPITHELIAL CELLS PRESENT FEW WBC SEEN NO ORGANISMS SEEN    Culture   Final    NO GROWTH 4 DAYS NO ANAEROBES ISOLATED; CULTURE IN PROGRESS FOR 5 DAYS Performed at [facility], [address], [place], [place] [address]    Report Status PENDING  Incomplete  Culture, blood (routine x 2)     Status: None (Preliminary result)   Collection Time: 02/07/21  8:24 AM   Specimen: BLOOD LEFT WRIST  Result Value Ref Range Status   Specimen  Description BLOOD LEFT WRIST  Final   Special Requests   Final    BOTTLES DRAWN AEROBIC AND ANAEROBIC Blood Culture adequate volume   Culture   Final    NO GROWTH 4 DAYS Performed at [facility], [address], [place], [place] [address]    Report Status PENDING  Incomplete  Culture, blood (routine x 2)     Status: None (Preliminary result)   Collection Time: 02/07/21  8:29 AM   Specimen: BLOOD RIGHT HAND  Result Value Ref Range Status   Specimen Description BLOOD RIGHT HAND  Final   Special Requests   Final    BOTTLES DRAWN AEROBIC AND ANAEROBIC Blood Culture adequate volume   Culture   Final    NO GROWTH 4 DAYS Performed at [facility], [address] [place], [place] [address]    Report Status PENDING  Incomplete  Surgical pcr screen     Status: None   Collection Time: 02/07/21 10:46 PM   Specimen: Nasal Mucosa; Nasal Swab  Result Value Ref Range Status   MRSA, PCR NEGATIVE NEGATIVE Final   Staphylococcus aureus NEGATIVE NEGATIVE Final    Comment: (NOTE) The Xpert SA Assay (FDA  approved for NASAL specimens in patients 37 years of age and older), is one component of a comprehensive surveillance program. It is not intended to diagnose infection nor to guide or monitor treatment. Performed at [facility], [address], [place], [place] [address]       Radiology Studies: No results found.    Scheduled Meds:  insulin aspart  0-9 Units Subcutaneous TID WC   senna-docusate  1 tablet Oral BID   Continuous Infusions:  cefTRIAXone (ROCEPHIN)  IV 2 g (02/10/21 1712)   vancomycin 750 mg (02/11/21 1345)      Assessment/Plan:  1.Septic arthritis/multiple hand abscesses with sepsis :  Present on admission.  Clinically improving.  WBC improving. Blood cultures negative.  Wound cultures with no growth, few gram-positive cocci.   Patient is currently on ceftriaxone and vancomycin.  We will continue today.  Patient is going for repeat debridement and closure of the wound.   If no bone is involved, anticipate discharge on oral antibiotics.    2.  AKI: POA in the setting of #1. Cr AT 2.0 on presentation--now resolved and cr at 0.8.  Normalized.  3. Hyponatremia: improved with IVF. Will d/c fluids. Encourage oral fluids  4. HTN: Blood pressures better today.  5. Diabetes Type 2: Metformin held on admission with #1 and #2. On SSI. Resume on discharge  6. Normocytic anemia: in the setting of acute illness. Hgb 12 at baseline. On admission 10.9 stable.  DVT prophylaxis:  SCD Code Status: Full code Family / Patient Communication: D/W Patient Disposition Plan:   Status is: Inpatient  Time spent: 25 min     >50% time spent in discussions with care team and coordination of care.    Dorcas Carrow, MD [facility] Pager in [place]  If 7PM-7AM, please contact night-coverage [email or web address] 02/11/2021, 1:54 PM

## 2021-02-11 NOTE — Anesthesia Preprocedure Evaluation (Addendum)
Anesthesia Evaluation  Patient identified by MRN, date of birth, ID band Patient awake    Reviewed: Allergy & Precautions, NPO status , Patient's Chart, lab work & pertinent test results  History of Anesthesia Complications Negative for: history of anesthetic complications  Airway Mallampati: II  TM Distance: >3 FB Neck ROM: Full    Dental no notable dental hx.    Pulmonary neg pulmonary ROS,    Pulmonary exam normal        Cardiovascular hypertension, Pt. on medications Normal cardiovascular exam     Neuro/Psych negative neurological ROS  negative psych ROS   GI/Hepatic negative GI ROS, Neg liver ROS,   Endo/Other  diabetes, Type 2, Oral Hypoglycemic Agents  Renal/GU Renal InsufficiencyRenal disease  negative genitourinary   Musculoskeletal  (+) Arthritis , Infected Right Wrist   Abdominal   Peds  Hematology  (+) anemia , Hgb 8.0   Anesthesia Other Findings Day of surgery medications reviewed with patient.  Reproductive/Obstetrics negative OB ROS                            Anesthesia Physical Anesthesia Plan  ASA: 3  Anesthesia Plan: General   Post-op Pain Management:    Induction: Intravenous  PONV Risk Score and Plan: 3 and Treatment may vary due to age or medical condition, Ondansetron and Dexamethasone  Airway Management Planned: LMA  Additional Equipment: None  Intra-op Plan:   Post-operative Plan: Extubation in OR  Informed Consent: I have reviewed the patients History and Physical, chart, labs and discussed the procedure including the risks, benefits and alternatives for the proposed anesthesia with the patient or authorized representative who has indicated his/her understanding and acceptance.     Dental advisory given  Plan Discussed with: CRNA  Anesthesia Plan Comments:        Anesthesia Quick Evaluation

## 2021-02-12 ENCOUNTER — Inpatient Hospital Stay: Payer: Self-pay

## 2021-02-12 ENCOUNTER — Encounter (HOSPITAL_COMMUNITY): Payer: Self-pay | Admitting: Orthopedic Surgery

## 2021-02-12 DIAGNOSIS — M19031 Primary osteoarthritis, right wrist: Secondary | ICD-10-CM

## 2021-02-12 DIAGNOSIS — N179 Acute kidney failure, unspecified: Secondary | ICD-10-CM | POA: Diagnosis not present

## 2021-02-12 DIAGNOSIS — M009 Pyogenic arthritis, unspecified: Secondary | ICD-10-CM

## 2021-02-12 LAB — GLUCOSE, CAPILLARY
Glucose-Capillary: 111 mg/dL — ABNORMAL HIGH (ref 70–99)
Glucose-Capillary: 97 mg/dL (ref 70–99)
Glucose-Capillary: 96 mg/dL (ref 70–99)
Glucose-Capillary: 97 mg/dL (ref 70–99)

## 2021-02-12 LAB — CK: Total CK: 96 U/L (ref 38–234)

## 2021-02-12 LAB — AEROBIC/ANAEROBIC CULTURE W GRAM STAIN (SURGICAL/DEEP WOUND)
Culture: NO GROWTH
Culture: NO GROWTH

## 2021-02-12 LAB — CULTURE, BLOOD (ROUTINE X 2)
Culture: NO GROWTH
Culture: NO GROWTH
Special Requests: ADEQUATE
Special Requests: ADEQUATE

## 2021-02-12 MED ORDER — DAPTOMYCIN IV (FOR PTA / DISCHARGE USE ONLY): 500.0000 mg | INTRAVENOUS | 0 refills | Status: DC

## 2021-02-12 MED ORDER — CEFTRIAXONE IV (FOR PTA / DISCHARGE USE ONLY): 2.0000 g | INTRAVENOUS | 0 refills | Status: DC

## 2021-02-12 MED ORDER — SODIUM CHLORIDE 0.9 % IV SOLN
8.0000 mg/kg | Freq: Every day | INTRAVENOUS | Status: DC
  Administered 2021-02-12: 500 mg via INTRAVENOUS
  Filled 2021-02-12 (×4): qty 10

## 2021-02-12 NOTE — Progress Notes (Signed)
Mobility Specialist Progress Note    02/12/21 1046  Mobility  Activity Ambulated in hall  Level of Assistance Independent  Assistive Device None  Distance Ambulated (ft) 560 ft  Mobility Ambulated independently in hallway  Mobility Response Tolerated well  Mobility performed by Mobility specialist  Bed Position Chair  $Mobility charge 1 Mobility   Pt received sitting EOB and agreeable. No complaints on walk but is feeling down about not being able to go home today. Returned to chair with MD present.   [place] Mobility Specialist  Mobility Specialist Phone: [phone]

## 2021-02-12 NOTE — Progress Notes (Signed)
PHARMACY CONSULT NOTE FOR:  OUTPATIENT  PARENTERAL ANTIBIOTIC THERAPY (OPAT)  Indication: Septic Arthritis Regimen:  Daptomycin 500mg  IV q24h Ceftriaxone 2g IV q24h End date: 03/25/21  IV antibiotic discharge orders are pended. To discharging provider:  please sign these orders via discharge navigator,  Select New Orders & click on the button choice - Manage This Unsigned Work.     Thank you for allowing pharmacy to be a part of this patient's care.  14/5/22, PharmD PGY2 Infectious Diseases Pharmacy Resident   Please check [email or web address] for unit-specific pharmacy phone numbers

## 2021-02-12 NOTE — TOC Initial Note (Addendum)
Transition of Care [facility]) - Initial/Assessment Note    Patient Details  Name: Christy Higgins MRN: [id number] Date of Birth: 1950-06-20  [facility] CM/SW Contact:    Marilu Favre, RN Phone Number: 02/12/2021, 10:15 AM  Clinical Narrative:                 In progression reported patient will need to discharge home with IV ABX. Discharge possible for 02/13/21.  Discussed above with patient. Explained [employer] is the infusion company ( pharmacy) that will make the ABX and ship it to her. Also [employer] has a Therapist, sports ,PAm who will come to the hospital and see her and family and provide teaching prior to discharge. Patient will have a [place] however [place] will not be present every time a dose is due. Patient voiced understanding and stated she lives with her husband and sister who can assist her.    Pam with [employer] aware and following. Pam has sent patient and husband video and will met with them this afternoon or tomorrow morning for teaching   NCM sent Tommi Rumps with [facility] a message , await a call back.   Expected Discharge Plan: [place]     Patient Goals and CMS Choice Patient states their goals for this hospitalization and ongoing recovery are:: to return to home CMS Medicare.gov Compare Post Acute Care list provided to:: Patient Choice offered to / list presented to : Patient  Expected Discharge Plan and Services Expected Discharge Plan: [place]   Discharge Planning Services: CM Consult Post Acute Care Choice: [place] arrangements for the past 2 months: Single Family Home                 DME Arranged: N/A         HH Arranged: RN HH Agency: [employer], [place] Date [facility] Agency Contacted: 02/12/21 Time HH Agency Contacted: [id number] Representative spoke with at [place]: Floyde Parkins  Prior Living Arrangements/Services Living arrangements for the past 2 months: [place] with:: Spouse,  Siblings Patient language and need for interpreter reviewed:: Yes Do you feel safe going back to the place where you live?: Yes      Need for Family Participation in Patient Care: Yes (Comment) Care giver support system in place?: Yes (comment)   Criminal Activity/Legal Involvement Pertinent to Current Situation/Hospitalization: No - Comment as needed  Activities of Daily Living Home Assistive Devices/Equipment: Eyeglasses ADL Screening (condition at time of admission) Patient's cognitive ability adequate to safely complete daily activities?: Yes Is the patient deaf or have difficulty hearing?: No Does the patient have difficulty seeing, even when wearing glasses/contacts?: Yes Does the patient have difficulty concentrating, remembering, or making decisions?: No Patient able to express need for assistance with ADLs?: Yes Does the patient have difficulty dressing or bathing?: No Independently performs ADLs?: Yes (appropriate for developmental age) Does the patient have difficulty walking or climbing stairs?: No Weakness of Legs: None Weakness of Arms/Hands: Right  Permission Sought/Granted   Permission granted to share information with : No              Emotional Assessment Appearance:: Appears stated age Attitude/Demeanor/Rapport: Engaged Affect (typically observed): Accepting Orientation: : Oriented to Self, Oriented to Place, Oriented to  Time, Oriented to Situation Alcohol / Substance Use: Not Applicable Psych Involvement: No (comment)  Admission diagnosis:  Septic arthritis ([place]) [M00.9] Hyponatremia [E87.1] AKI (acute kidney injury) ([place]) [N17.9]  Cellulitis of right wrist [L03.113] Patient Active Problem List   Diagnosis Date Noted   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   Septic arthritis ([place]) 02/06/2021   AKI (acute kidney injury) ([place])    Hyponatremia    Cellulitis 02/04/2021   PCP:  Shanon Rosser, PA-C Pharmacy:   [facility] [place], [place] [address] Phone: [phone] Fax: [phone]     Social Determinants of Health (SDOH) Interventions    Readmission Risk Interventions No flowsheet data found.

## 2021-02-12 NOTE — Consult Note (Signed)
[facility]    Date of Admission:  02/06/2021     Total days of antibiotics 7               Reason for Consult: Septic Arthritis     Referring Provider: Dr. Sloan Leiter Primary Care Provider: Shanon Rosser, PA-C   ASSESSMENT:  Christy Higgins is a 70 y/o AA female admitted with right hand infection now POD #1 from repeat I&D with no growth on surgical specimens. Source control appears to be achieved. Recommend 6 weeks of antibiotic therapy with Daptomycin and ceftriaxone. Will need PICC line prior to discharge. Monitor CK levels while on Daptomycin. Continue wound care per orthopedics. OPAT/Home Health orders placed. Discussed plan of care and follow up. Remaining medical and supportive care per primary team.   PLAN:  Continue current dose of Daptomycin and Ceftriaxone.  OPAT / Home Health orders Monitor CK levels while on Daptomycin for therapeutic drug monitoring. PICC line placement prior to discharge. Wound care per orthopedics. Follow up in ID office. Remaining medical and supportive care per primary team.   Diagnosis:  Septic arthritis right wrist  Culture Result: Culture negative  No Known Allergies  OPAT Orders Discharge antibiotics to be given via PICC line Discharge antibiotics: Per pharmacy protocol   Duration:  6 weeks  End Date:  03/25/21  [facility] Per Protocol:  Home health RN for IV administration and teaching; PICC line care and labs.    Labs weekly while on IV antibiotics: _X_ CBC with differential _X_ BMP __ CMP _X_ CRP _X_ ESR __ Vancomycin trough _X_ CK  __ Please pull PIC at completion of IV antibiotics _X_ Please leave PIC in place until doctor has seen patient or been notified  Fax weekly labs to [phone]  Clinic Follow Up Appt:  03/25/21 at 9:45 am with Dr. Tommy Medal   Active Problems:   Arthritis of right wrist   Septic arthritis (HCC)    insulin aspart  0-9 Units Subcutaneous TID WC    senna-docusate  1 tablet Oral BID     HPI: Christy Higgins is a 70 y.o. female with previous medical history of hypertension and Type 2 diabetes admitted with right hand pain and swelling refractory to Bactrim.   Christy Higgins was initially seen on 02/03/21 at the [facility] ED with 1 week history of right rist pain with no significant trauma or injury. Pain described as aching and burning. Noted to have WBC count of 18.4 with X-ray wrist wrist showing degenerative changes in the radiocarpal joint and diffuse soft tissue swelling. Also noted to have lymphadenopathy with CT soft tissue neck with no cervical lymphadenopathy. Joint aspiration performed with WBC count of 1,245 and 88% neutrophils. Discharged on Bactrim.   Christy Higgins was taking the antibiotics as prescribed with worsening redness, swelling and pain and returned to the ED on 02/06/21. MRI right hand with severe soft tissue infection of the hand and wrist with multifocal superficial and deep fluid collections likely abscesses. Brought to the OR with findings of gross purulence and drained deep abscess of the right wrist region with fluid accumulated throughout the palmar surface and deep space spaces of the hand. One of two specimens had gram positive cocci on gram stain with no culture growth on either specimen. Broad spectrum coverage was initiated with vancomycin and cefepime. Secondary I&D's performed on 02/08/21 and 02/11/21.   Christy Higgins has remained afebrile since admission. Blood cultures have been  without growth. Currently on Day 7 of antimicrobial therapy with vancomycin and ceftriaxone. No further surgical interventions are planned at this point. Has been having swelling of her fingers since surgery. Denies fevers/chills. WBC count down trending. ID has been consulted for antibiotic recommendations.   Review of Systems: Review of Systems  Constitutional:  Negative for chills, fever and weight loss.  Respiratory:  Negative for cough,  shortness of breath and wheezing.   Cardiovascular:  Negative for chest pain and leg swelling.  Gastrointestinal:  Negative for abdominal pain, constipation, diarrhea, nausea and vomiting.  Musculoskeletal:        Positive for right hand swelling.   Skin:  Negative for rash.    Past Medical History:  Diagnosis Date   Diabetes mellitus    High cholesterol    Hypertension     Social History   Tobacco Use   Smoking status: Never   Smokeless tobacco: Never  Substance Use Topics   Alcohol use: No   Drug use: No    Family History  Problem Relation Age of Onset   Hypertension Mother    Diabetes Father    Hypertension Father    Hypertension Sister    Diabetes Sister    Hypertension Sister    Diabetes Sister    Hypertension Sister    Diabetes Sister     No Known Allergies  OBJECTIVE: Blood pressure 130/76, pulse 81, temperature 99.3 F (37.4 C), temperature source Oral, resp. rate 18, height 5' (1.524 m), weight 62.2 kg, SpO2 99 %.  Physical Exam Constitutional:      General: She is not in acute distress.    Appearance: She is well-developed.  Cardiovascular:     Rate and Rhythm: Normal rate and regular rhythm.     Heart sounds: Normal heart sounds.  Pulmonary:     Effort: Pulmonary effort is normal.     Breath sounds: Normal breath sounds.  Musculoskeletal:     Comments: Surgical dressing in place which is clean and dry. There is edema in her fingers.   Skin:    General: Skin is warm and dry.  Neurological:     Mental Status: She is alert and oriented to person, place, and time.  Psychiatric:        Behavior: Behavior normal.        Thought Content: Thought content normal.        Judgment: Judgment normal.    Lab Results Lab Results  Component Value Date   WBC 10.6 (H) 02/11/2021   HGB 8.0 (L) 02/11/2021   HCT 24.9 (L) 02/11/2021   MCV 86.5 02/11/2021   PLT 347 02/11/2021    Lab Results  Component Value Date   CREATININE 0.59 02/11/2021   BUN 5  (L) 02/11/2021   NA 141 02/11/2021   K 3.7 02/11/2021   CL 111 02/11/2021   CO2 23 02/11/2021    Lab Results  Component Value Date   ALT 24 02/06/2021   AST 26 02/06/2021   ALKPHOS 99 02/06/2021   BILITOT 0.5 02/06/2021     Microbiology: Recent Results (from the past 240 hour(s))  Resp Panel by RT-PCR (Flu A&B, Covid) Nasopharyngeal Swab     Status: None   Collection Time: 02/04/21  1:42 AM   Specimen: Nasopharyngeal Swab; Nasopharyngeal(NP) swabs in vial transport medium  Result Value Ref Range Status   SARS Coronavirus 2 by RT PCR NEGATIVE NEGATIVE Final    Comment: (NOTE) SARS-CoV-2 target nucleic acids are NOT  DETECTED.  The SARS-CoV-2 RNA is generally detectable in upper respiratory specimens during the acute phase of infection. The lowest concentration of SARS-CoV-2 viral copies this assay can detect is 138 copies/mL. A negative result does not preclude SARS-Cov-2 infection and should not be used as the sole basis for treatment or other patient management decisions. A negative result may occur with  improper specimen collection/handling, submission of specimen other than nasopharyngeal swab, presence of viral mutation(s) within the areas targeted by this assay, and inadequate number of viral copies(<138 copies/mL). A negative result must be combined with clinical observations, patient history, and epidemiological information. The expected result is Negative.  Fact Sheet for Patients:  EntrepreneurPulse.com.au  Fact Sheet for Healthcare Providers:  IncredibleEmployment.be  This test is no t yet approved or cleared by the [place] FDA and  has been authorized for detection and/or diagnosis of SARS-CoV-2 by FDA under an Emergency Use Authorization (EUA). This EUA will remain  in effect (meaning this test can be used) for the duration of the COVID-19 declaration under Section 564(b)(1) of the Act, 21 U.S.C.section  360bbb-3(b)(1), unless the authorization is terminated  or revoked sooner.       Influenza A by PCR NEGATIVE NEGATIVE Final   Influenza B by PCR NEGATIVE NEGATIVE Final    Comment: (NOTE) The Xpert Xpress SARS-CoV-2/FLU/RSV plus assay is intended as an aid in the diagnosis of influenza from Nasopharyngeal swab specimens and should not be used as a sole basis for treatment. Nasal washings and aspirates are unacceptable for Xpert Xpress SARS-CoV-2/FLU/RSV testing.  Fact Sheet for Patients: EntrepreneurPulse.com.au  Fact Sheet for Healthcare Providers: IncredibleEmployment.be  This test is not yet approved or cleared by the [place] FDA and has been authorized for detection and/or diagnosis of SARS-CoV-2 by FDA under an Emergency Use Authorization (EUA). This EUA will remain in effect (meaning this test can be used) for the duration of the COVID-19 declaration under Section 564(b)(1) of the Act, 21 U.S.C. section 360bbb-3(b)(1), unless the authorization is terminated or revoked.  Performed at [facility], [place] [address], [place], [place] [address]   Resp Panel by RT-PCR (Flu A&B, Covid) Nasopharyngeal Swab     Status: None   Collection Time: 02/06/21  2:31 PM   Specimen: Nasopharyngeal Swab; Nasopharyngeal(NP) swabs in vial transport medium  Result Value Ref Range Status   SARS Coronavirus 2 by RT PCR NEGATIVE NEGATIVE Final    Comment: (NOTE) SARS-CoV-2 target nucleic acids are NOT DETECTED.  The SARS-CoV-2 RNA is generally detectable in upper respiratory specimens during the acute phase of infection. The lowest concentration of SARS-CoV-2 viral copies this assay can detect is 138 copies/mL. A negative result does not preclude SARS-Cov-2 infection and should not be used as the sole basis for treatment or other patient management decisions. A negative result may occur with  improper specimen collection/handling, submission of  specimen other than nasopharyngeal swab, presence of viral mutation(s) within the areas targeted by this assay, and inadequate number of viral copies(<138 copies/mL). A negative result must be combined with clinical observations, patient history, and epidemiological information. The expected result is Negative.  Fact Sheet for Patients:  EntrepreneurPulse.com.au  Fact Sheet for Healthcare Providers:  IncredibleEmployment.be  This test is no t yet approved or cleared by the [place] FDA and  has been authorized for detection and/or diagnosis of SARS-CoV-2 by FDA under an Emergency Use Authorization (EUA). This EUA will remain  in effect (meaning this test can be used) for  the duration of the COVID-19 declaration under Section 564(b)(1) of the Act, 21 U.S.C.section 360bbb-3(b)(1), unless the authorization is terminated  or revoked sooner.       Influenza A by PCR NEGATIVE NEGATIVE Final   Influenza B by PCR NEGATIVE NEGATIVE Final    Comment: (NOTE) The Xpert Xpress SARS-CoV-2/FLU/RSV plus assay is intended as an aid in the diagnosis of influenza from Nasopharyngeal swab specimens and should not be used as a sole basis for treatment. Nasal washings and aspirates are unacceptable for Xpert Xpress SARS-CoV-2/FLU/RSV testing.  Fact Sheet for Patients: EntrepreneurPulse.com.au  Fact Sheet for Healthcare Providers: IncredibleEmployment.be  This test is not yet approved or cleared by the [place] FDA and has been authorized for detection and/or diagnosis of SARS-CoV-2 by FDA under an Emergency Use Authorization (EUA). This EUA will remain in effect (meaning this test can be used) for the duration of the COVID-19 declaration under Section 564(b)(1) of the Act, 21 U.S.C. section 360bbb-3(b)(1), unless the authorization is terminated or revoked.  Performed at [facility], [place] [address],  [place], [place] [address]   Aerobic/Anaerobic Culture w Gram Stain (surgical/deep wound)     Status: None   Collection Time: 02/06/21 10:35 PM   Specimen: Wound  Result Value Ref Range Status   Specimen Description WOUND  Final   Special Requests RIGHT WRIST WOUND SPEC A  Final   Gram Stain   Final    FEW SQUAMOUS EPITHELIAL CELLS PRESENT MODERATE WBC SEEN FEW GRAM POSITIVE COCCI    Culture   Final    No growth aerobically or anaerobically. Performed at [facility], [place] [address], [place], [place] [address]    Report Status 02/12/2021 FINAL  Final  Aerobic/Anaerobic Culture w Gram Stain (surgical/deep wound)     Status: None   Collection Time: 02/06/21 10:49 PM   Specimen: PATH Soft tissue  Result Value Ref Range Status   Specimen Description TISSUE  Final   Special Requests RIGHT WOUND TISSUE SPEC B  Final   Gram Stain   Final    FEW SQUAMOUS EPITHELIAL CELLS PRESENT FEW WBC SEEN NO ORGANISMS SEEN    Culture   Final    No growth aerobically or anaerobically. Performed at [facility], [place] [address], [place], [place] [address]    Report Status 02/12/2021 FINAL  Final  Culture, blood (routine x 2)     Status: None   Collection Time: 02/07/21  8:24 AM   Specimen: BLOOD LEFT WRIST  Result Value Ref Range Status   Specimen Description BLOOD LEFT WRIST  Final   Special Requests   Final    BOTTLES DRAWN AEROBIC AND ANAEROBIC Blood Culture adequate volume   Culture   Final    NO GROWTH 5 DAYS Performed at [facility], [place] [address] [place], [place] [address]    Report Status 02/12/2021 FINAL  Final  Culture, blood (routine x 2)     Status: None   Collection Time: 02/07/21  8:29 AM   Specimen: BLOOD RIGHT HAND  Result Value Ref Range Status   Specimen Description BLOOD RIGHT HAND  Final   Special Requests   Final    BOTTLES DRAWN AEROBIC AND ANAEROBIC Blood Culture adequate volume   Culture   Final    NO GROWTH 5 DAYS Performed at [facility], [place] [address] [place], [place] [address]    Report Status 02/12/2021 FINAL  Final  Surgical  pcr screen     Status: None   Collection Time: 02/07/21 10:46 PM   Specimen: Nasal Mucosa; Nasal Swab  Result Value Ref Range Status   MRSA, PCR NEGATIVE NEGATIVE Final   Staphylococcus aureus NEGATIVE NEGATIVE Final    Comment: (NOTE) The Xpert SA Assay (FDA approved for NASAL specimens in patients 81 years of age and older), is one component of a comprehensive surveillance program. It is not intended to diagnose infection nor to guide or monitor treatment. Performed at [facility], [place] [address] [place], [place] [address]      Terri Piedra, [facility]  02/12/2021  2:22 PM

## 2021-02-12 NOTE — Progress Notes (Signed)
PROGRESS NOTE    Christy Higgins  IWP:[id number]  DOB: 1950/12/16  PCP: Lindaann Pascal, PA-C Admit date:02/06/2021 Chief compliant: rt hand swelling 70 y.o. female with medical history significant of HTN, diabetes mellitus type 2, HLD presented with right hand swelling and pain not improving with outpatient Bactrim.  On presentation, temperature was 99.2 with severe right hand swelling.  WBCs of 30k with creatinine of 2.0.  She was started on broad-spectrum antibiotics for sepsis due to right wrist septic arthritis and right hand multiple deep space abscesses  Hand surgery was consulted.    She underwent surgical debridement on 10/19, 10/21 and revision and closure of wound on 10/24.  Found to have deep tissue infection and septic arthritis needing IV antibiotics on discharge. AKI/hyponatremia improving with IVF.   Subjective: Patient seen and examined.  Overwhelmed with thought of going home with IV antibiotics and PICC line as well as not happy with unable to go home today.  I explained to her why she will need IV antibiotics to save function of her right hand and she is agreeable.  ID consult was called, will await further decision for PICC line orders.  Objective: Vitals:   02/12/21 0013 02/12/21 0420 02/12/21 0821 02/12/21 1141  BP: 135/60 129/61 (!) 119/59 130/76  Pulse: 61 65 90 81  Resp: 17 18 18 18   Temp: 98.6 F (37 C) 98.2 F (36.8 C) 99.2 F (37.3 C) 99.3 F (37.4 C)  TempSrc: Oral Oral Oral Oral  SpO2: 97% 98% 100% 99%  Weight:      Height:        Intake/Output Summary (Last 24 hours) at 02/12/2021 1254 Last data filed at 02/11/2021 1742 Gross per 24 hour  Intake 1070.79 ml  Output 5 ml  Net 1065.79 ml    Filed Weights   02/07/21 0900  Weight: 62.2 kg    Physical Examination:  General: Looks comfortable.  On room air.  Anxious today and overwhelmed. Cardiovascular: S1-S2 normal. Respiratory: Bilateral clear. Gastrointestinal: Soft.  Nontender.  Bowel  sound present. Ext: Right wrist on compression dressing, neurovascular status distal fingertips.      Data Reviewed: I have personally reviewed following labs and imaging studies  CBC: Recent Labs  Lab 02/06/21 1346 02/07/21 0210 02/08/21 0218 02/09/21 0136 02/11/21 0050  WBC 30.0* 27.6* 17.7* 13.2* 10.6*  NEUTROABS 28.2*  --  13.9* 9.4*  --   HGB 9.1* 8.4* 8.6* 8.0* 8.0*  HCT 28.4* 25.7* 26.1* 24.8* 24.9*  MCV 85.5 84.0 83.7 85.5 86.5  PLT 243 261 276 276 347   Basic Metabolic Panel: Recent Labs  Lab 02/06/21 1346 02/07/21 0210 02/08/21 0218 02/09/21 0136 02/11/21 0050  NA 130* 134* 138 138 141  K 3.6 4.2 4.4 4.1 3.7  CL 99 106 109 111 111  CO2 21* 20* 22 22 23   GLUCOSE 71 112* 88 118* 118*  BUN 30* 22 15 12  5*  CREATININE 2.00* 1.42* 0.94 0.86 0.59  CALCIUM 8.4* 8.0* 8.4* 7.9* 8.1*  MG  --   --  2.0 1.8  --    GFR: Estimated Creatinine Clearance: 54.7 mL/min (by C-G formula based on SCr of 0.59 mg/dL). Liver Function Tests: Recent Labs  Lab 02/06/21 1346  AST 26  ALT 24  ALKPHOS 99  BILITOT 0.5  PROT 6.3*  ALBUMIN 2.3*   No results for input(s): LIPASE, AMYLASE in the last 168 hours. No results for input(s): AMMONIA in the last 168 hours. Coagulation Profile: No results for  input(s): INR, PROTIME in the last 168 hours. Cardiac Enzymes: No results for input(s): CKTOTAL, CKMB, CKMBINDEX, TROPONINI in the last 168 hours. BNP (last 3 results) No results for input(s): PROBNP in the last 8760 hours. HbA1C: No results for input(s): HGBA1C in the last 72 hours. CBG: Recent Labs  Lab 02/11/21 1636 02/11/21 1745 02/11/21 2139 02/12/21 0823 02/12/21 1137  GLUCAP 73 87 129* 111* 97   Lipid Profile: No results for input(s): CHOL, HDL, LDLCALC, TRIG, CHOLHDL, LDLDIRECT in the last 72 hours. Thyroid Function Tests: No results for input(s): TSH, T4TOTAL, FREET4, T3FREE, THYROIDAB in the last 72 hours. Anemia Panel: Recent Labs    02/10/21 1043   VITAMINB12 1,420*  FOLATE 22.8  TIBC 161*  IRON 27*  RETICCTPCT 1.1   Sepsis Labs: Recent Labs  Lab 02/06/21 1445 02/06/21 1546  LATICACIDVEN 2.3* 1.4    Recent Results (from the past 240 hour(s))  Resp Panel by RT-PCR (Flu A&B, Covid) Nasopharyngeal Swab     Status: None   Collection Time: 02/04/21  1:42 AM   Specimen: Nasopharyngeal Swab; Nasopharyngeal(NP) swabs in vial transport medium  Result Value Ref Range Status   SARS Coronavirus 2 by RT PCR NEGATIVE NEGATIVE Final    Comment: (NOTE) SARS-CoV-2 target nucleic acids are NOT DETECTED.  The SARS-CoV-2 RNA is generally detectable in upper respiratory specimens during the acute phase of infection. The lowest concentration of SARS-CoV-2 viral copies this assay can detect is 138 copies/mL. A negative result does not preclude SARS-Cov-2 infection and should not be used as the sole basis for treatment or other patient management decisions. A negative result may occur with  improper specimen collection/handling, submission of specimen other than nasopharyngeal swab, presence of viral mutation(s) within the areas targeted by this assay, and inadequate number of viral copies(<138 copies/mL). A negative result must be combined with clinical observations, patient history, and epidemiological information. The expected result is Negative.  Fact Sheet for Patients:  BloggerCourse.com  Fact Sheet for Healthcare Providers:  SeriousBroker.it  This test is no t yet approved or cleared by the [place] FDA and  has been authorized for detection and/or diagnosis of SARS-CoV-2 by FDA under an Emergency Use Authorization (EUA). This EUA will remain  in effect (meaning this test can be used) for the duration of the COVID-19 declaration under Section 564(b)(1) of the Act, 21 U.S.C.section 360bbb-3(b)(1), unless the authorization is terminated  or revoked sooner.       Influenza  A by PCR NEGATIVE NEGATIVE Final   Influenza B by PCR NEGATIVE NEGATIVE Final    Comment: (NOTE) The Xpert Xpress SARS-CoV-2/FLU/RSV plus assay is intended as an aid in the diagnosis of influenza from Nasopharyngeal swab specimens and should not be used as a sole basis for treatment. Nasal washings and aspirates are unacceptable for Xpert Xpress SARS-CoV-2/FLU/RSV testing.  Fact Sheet for Patients: BloggerCourse.com  Fact Sheet for Healthcare Providers: SeriousBroker.it  This test is not yet approved or cleared by the [place] FDA and has been authorized for detection and/or diagnosis of SARS-CoV-2 by FDA under an Emergency Use Authorization (EUA). This EUA will remain in effect (meaning this test can be used) for the duration of the COVID-19 declaration under Section 564(b)(1) of the Act, 21 U.S.C. section 360bbb-3(b)(1), unless the authorization is terminated or revoked.  Performed at [facility], [address] [place], [place] [address]   Resp Panel by RT-PCR (Flu A&B, Covid) Nasopharyngeal Swab     Status: None  Collection Time: 02/06/21  2:31 PM   Specimen: Nasopharyngeal Swab; Nasopharyngeal(NP) swabs in vial transport medium  Result Value Ref Range Status   SARS Coronavirus 2 by RT PCR NEGATIVE NEGATIVE Final    Comment: (NOTE) SARS-CoV-2 target nucleic acids are NOT DETECTED.  The SARS-CoV-2 RNA is generally detectable in upper respiratory specimens during the acute phase of infection. The lowest concentration of SARS-CoV-2 viral copies this assay can detect is 138 copies/mL. A negative result does not preclude SARS-Cov-2 infection and should not be used as the sole basis for treatment or other patient management decisions. A negative result may occur with  improper specimen collection/handling, submission of specimen other than nasopharyngeal swab, presence of viral mutation(s) within the areas targeted  by this assay, and inadequate number of viral copies(<138 copies/mL). A negative result must be combined with clinical observations, patient history, and epidemiological information. The expected result is Negative.  Fact Sheet for Patients:  BloggerCourse.com  Fact Sheet for Healthcare Providers:  SeriousBroker.it  This test is no t yet approved or cleared by the [place] FDA and  has been authorized for detection and/or diagnosis of SARS-CoV-2 by FDA under an Emergency Use Authorization (EUA). This EUA will remain  in effect (meaning this test can be used) for the duration of the COVID-19 declaration under Section 564(b)(1) of the Act, 21 U.S.C.section 360bbb-3(b)(1), unless the authorization is terminated  or revoked sooner.       Influenza A by PCR NEGATIVE NEGATIVE Final   Influenza B by PCR NEGATIVE NEGATIVE Final    Comment: (NOTE) The Xpert Xpress SARS-CoV-2/FLU/RSV plus assay is intended as an aid in the diagnosis of influenza from Nasopharyngeal swab specimens and should not be used as a sole basis for treatment. Nasal washings and aspirates are unacceptable for Xpert Xpress SARS-CoV-2/FLU/RSV testing.  Fact Sheet for Patients: BloggerCourse.com  Fact Sheet for Healthcare Providers: SeriousBroker.it  This test is not yet approved or cleared by the [place] FDA and has been authorized for detection and/or diagnosis of SARS-CoV-2 by FDA under an Emergency Use Authorization (EUA). This EUA will remain in effect (meaning this test can be used) for the duration of the COVID-19 declaration under Section 564(b)(1) of the Act, 21 U.S.C. section 360bbb-3(b)(1), unless the authorization is terminated or revoked.  Performed at [facility], [address] [place] [address], [place], [place] [address]   Aerobic/Anaerobic Culture w Gram Stain (surgical/deep wound)     Status:  None (Preliminary result)   Collection Time: 02/06/21 10:35 PM   Specimen: Wound  Result Value Ref Range Status   Specimen Description WOUND  Final   Special Requests RIGHT WRIST WOUND SPEC A  Final   Gram Stain   Final    FEW SQUAMOUS EPITHELIAL CELLS PRESENT MODERATE WBC SEEN FEW GRAM POSITIVE COCCI    Culture   Final    NO GROWTH 4 DAYS NO ANAEROBES ISOLATED; CULTURE IN PROGRESS FOR 5 DAYS Performed at [facility], [address], [place], [place] [address]    Report Status PENDING  Incomplete  Aerobic/Anaerobic Culture w Gram Stain (surgical/deep wound)     Status: None (Preliminary result)   Collection Time: 02/06/21 10:49 PM   Specimen: PATH Soft tissue  Result Value Ref Range Status   Specimen Description TISSUE  Final   Special Requests RIGHT WOUND TISSUE SPEC B  Final   Gram Stain   Final    FEW SQUAMOUS EPITHELIAL CELLS PRESENT FEW WBC SEEN NO ORGANISMS SEEN  Culture   Final    NO GROWTH 4 DAYS NO ANAEROBES ISOLATED; CULTURE IN PROGRESS FOR 5 DAYS Performed at [facility], [address] [place], [place] [address]    Report Status PENDING  Incomplete  Culture, blood (routine x 2)     Status: None   Collection Time: 02/07/21  8:24 AM   Specimen: BLOOD LEFT WRIST  Result Value Ref Range Status   Specimen Description BLOOD LEFT WRIST  Final   Special Requests   Final    BOTTLES DRAWN AEROBIC AND ANAEROBIC Blood Culture adequate volume   Culture   Final    NO GROWTH 5 DAYS Performed at [facility], [address] [place], [place] [address]    Report Status 02/12/2021 FINAL  Final  Culture, blood (routine x 2)     Status: None   Collection Time: 02/07/21  8:29 AM   Specimen: BLOOD RIGHT HAND  Result Value Ref Range Status   Specimen Description BLOOD RIGHT HAND  Final   Special Requests   Final    BOTTLES DRAWN AEROBIC AND ANAEROBIC Blood Culture adequate volume   Culture   Final    NO GROWTH 5 DAYS Performed at [facility]  Lab, [address] [place], [place] [address]    Report Status 02/12/2021 FINAL  Final  Surgical pcr screen     Status: None   Collection Time: 02/07/21 10:46 PM   Specimen: Nasal Mucosa; Nasal Swab  Result Value Ref Range Status   MRSA, PCR NEGATIVE NEGATIVE Final   Staphylococcus aureus NEGATIVE NEGATIVE Final    Comment: (NOTE) The Xpert SA Assay (FDA approved for NASAL specimens in patients 20 years of age and older), is one component of a comprehensive surveillance program. It is not intended to diagnose infection nor to guide or monitor treatment. Performed at [facility], [address], [place], [place] [address]       Radiology Studies: No results found.    Scheduled Meds:  insulin aspart  0-9 Units Subcutaneous TID WC   senna-docusate  1 tablet Oral BID   Continuous Infusions:  cefTRIAXone (ROCEPHIN)  IV 2 g (02/10/21 1712)   DAPTOmycin (CUBICIN)  IV        Assessment/Plan:  1.Septic arthritis/multiple hand abscesses with sepsis :  Present on admission.  Clinically improving.  WBC improving. Blood cultures negative.  Wound cultures with no growth, few gram-positive cocci.   Patient is currently on ceftriaxone and vancomycin.  We will continue today.  Patient is going for repeat debridement and closure of the wound.   If no bone is involved, anticipate discharge on oral antibiotics.    2.  AKI: POA in the setting of #1. Cr AT 2.0 on presentation--now resolved and cr at 0.8.  Normalized.  3. Hyponatremia: improved with IVF. Will d/c fluids. Encourage oral fluids  4. HTN: Blood pressures better today.  5. Diabetes Type 2: Metformin held on admission with #1 and #2. On SSI. Resume on discharge  6. Normocytic anemia: in the setting of acute illness. Hgb 12 at baseline. On admission 10.9 stable.  DVT prophylaxis:  SCD Code Status: Full code Family / Patient Communication: D/W Patient Disposition Plan:   Status is: Inpatient  Time spent: 25 min      >50% time spent in discussions with care team and coordination of care.    Dorcas Carrow, MD [facility] Pager in [place]  If 7PM-7AM, please contact night-coverage  [email or web address] 02/12/2021, 12:54 PM

## 2021-02-13 ENCOUNTER — Ambulatory Visit: Payer: BC Managed Care – PPO | Admitting: Infectious Diseases

## 2021-02-13 DIAGNOSIS — M19031 Primary osteoarthritis, right wrist: Secondary | ICD-10-CM | POA: Diagnosis not present

## 2021-02-13 LAB — GLUCOSE, CAPILLARY
Glucose-Capillary: 77 mg/dL (ref 70–99)
Glucose-Capillary: 70 mg/dL (ref 70–99)

## 2021-02-13 MED ORDER — SODIUM CHLORIDE 0.9% FLUSH
10.0000 mL | Freq: Two times a day (BID) | INTRAVENOUS | Status: DC
Start: 2021-02-13 — End: 2021-02-13
  Administered 2021-02-13: 10 mL

## 2021-02-13 MED ORDER — CHLORHEXIDINE GLUCONATE CLOTH 2 % EX PADS
6.0000 | MEDICATED_PAD | Freq: Every day | CUTANEOUS | Status: DC
  Administered 2021-02-13: 6 via TOPICAL

## 2021-02-13 MED ORDER — SODIUM CHLORIDE 0.9% FLUSH: 10.0000 mL | INTRAVENOUS | Status: DC | PRN

## 2021-02-13 NOTE — Progress Notes (Signed)
Peripherally Inserted Central Catheter Placement  The IV Nurse has discussed with the patient and/or persons authorized to consent for the patient, the purpose of this procedure and the potential benefits and risks involved with this procedure.  The benefits include less needle sticks, lab draws from the catheter, and the patient may be discharged home with the catheter. Risks include, but not limited to, infection, bleeding, blood clot (thrombus formation), and puncture of an artery; nerve damage and irregular heartbeat and possibility to perform a PICC exchange if needed/ordered by physician.  Alternatives to this procedure were also discussed.  Bard Power PICC patient education guide, fact sheet on infection prevention and patient information card has been provided to patient /or left at bedside.    PICC Placement Documentation  PICC Single Lumen 02/13/21 Left Brachial 37 cm 0 cm (Active)  Indication for Insertion or Continuance of Line Home intravenous therapies (PICC only) 02/13/21 1200  Exposed Catheter (cm) 0 cm 02/13/21 1200  Site Assessment Clean;Dry;Intact 02/13/21 1200  Line Status Flushed;Saline locked;Blood return noted 02/13/21 1200  Dressing Type Transparent;Securing device;Other (Comment) 02/13/21 1200  Dressing Status Clean;Dry;Intact 02/13/21 1200  Antimicrobial disc in place? Yes 02/13/21 1200  Safety Lock Not Applicable 02/13/21 1200  Line Care Connections checked and tightened 02/13/21 1200  Line Adjustment (NICU/IV Team Only) No 02/13/21 1200  Dressing Intervention New dressing 02/13/21 1200  Dressing Change Due 02/20/21 02/13/21 1200       Vernona Rieger  Legacy Carrender 02/13/2021, 12:06 PM

## 2021-02-13 NOTE — TOC Progression Note (Signed)
Transition of Care [facility]) - Progression Note    Patient Details  Name: Christy Higgins MRN: [id number] Date of Birth: 02-Aug-1950  Transition of Care [facility]) CM/SW Contact  Nadene Rubins Adria Devon, RN Phone Number: 02/13/2021, 10:55 AM  Clinical Narrative:     In progression, plan to discharge today . Pam with [employer] aware and teaching patient and husband this morning.   Kandee Keen with [facility] accepted [facility] for [facility] and aware discharge planned for today   Expected Discharge Plan: Home w Home Health Services    Expected Discharge Plan and Services Expected Discharge Plan: Home w Home Health Services   Discharge Planning Services: CM Consult Post Acute Care Choice: Home Health Living arrangements for the past 2 months: Single Family Home                 DME Arranged: N/A         HH Arranged: RN HH Agency: [facility] Date [facility] Agency Contacted: 02/12/21 Time HH Agency Contacted: 1014 Representative spoke with at [facility] Agency: Elita Quick and Kandee Keen   Social Determinants of Health (SDOH) Interventions    Readmission Risk Interventions No flowsheet data found.

## 2021-02-13 NOTE — Discharge Summary (Signed)
Physician Discharge Summary  Deola Rewis ZHG:[id number] DOB: 26-Nov-1950 DOA: 02/06/2021  PCP: Shanon Rosser, PA-C  Admit date: 02/06/2021 Discharge date: 02/13/2021  Admitted From: Home Disposition: Home with IV infusion therapy  Recommendations for Outpatient Follow-up:  Follow up with PCP in 1-2 weeks ID clinic will schedule follow-up. Orthopedics will schedule follow-up, keep your dressing intact until then.  Home Health: RN, infusion therapy Equipment/Devices: None  Discharge Condition: Stable CODE STATUS: Full code Diet recommendation: Low-salt, low-carb diet  Discharge summary: 70 year old female with history of hypertension, type 2 diabetes that is diet-controlled, hyperlipidemia presented with right hand swelling and pain not improving with outpatient Bactrim.  Spontaneous onset of swelling with no trauma.  On arrival, temperature 99.2, severe right hand swelling, WBC 30,000 and creatinine was 2.  Started on broad-spectrum antibiotics, underwent surgical debridement on 10/19, 10/21, revision and closure of the wound on 10/24 by hand surgeon.  Treated with IV fluids and renal functions normalized.  Intraoperative cultures without any growth.  Due to significant joint space infection and multiple compartment infection on the dominant hand, it was decided by surgery and ID team to be treated with IV antibiotics.  Patient is medically stable.  She received a PICC line today.  Patient is discharging home with home infusion therapy with daptomycin and Rocephin for 6 weeks.  Drug monitoring, lab monitoring will be scheduled by infectious disease clinic.  Surgery recommended not to remove the dressing until they see her in the follow-up.   Discharge Diagnoses:  Active Problems:   Arthritis of right wrist   Septic arthritis [facility])    Discharge Instructions  Discharge Instructions     [facility] pharmacist to adjust dose for Vancomycin, Aminoglycosides and other  anti-infective therapies as requested by physician.   Complete by: As directed    [facility] to provide Cath Flo 2mg    Complete by: As directed    Administer for PICC line occlusion and as ordered by physician for other access device issues.   Anaphylaxis Kit: Provided to treat any anaphylactic reaction to the medication being provided to the patient if First Dose or when requested by physician   Complete by: As directed    Epinephrine 1mg /ml vial / amp: Administer 0.3mg  (0.105ml) subcutaneously once for moderate to severe anaphylaxis, nurse to call physician and pharmacy when reaction occurs and call 911 if needed for immediate care   Diphenhydramine 50mg /ml IV vial: Administer 25-50mg  IV/IM PRN for first dose reaction, rash, itching, mild reaction, nurse to call physician and pharmacy when reaction occurs   Sodium Chloride 0.9% NS 526ml IV: Administer if needed for hypovolemic blood pressure drop or as ordered by physician after call to physician with anaphylactic reaction   Call MD for:  redness, tenderness, or signs of infection (pain, swelling, redness, odor or green/yellow discharge around incision site)   Complete by: As directed    Call MD for:  severe uncontrolled pain   Complete by: As directed    Call MD for:  temperature >100.4   Complete by: As directed    Change dressing on IV access line weekly and PRN   Complete by: As directed    Diet - low sodium heart healthy   Complete by: As directed    Flush IV access with Sodium Chloride 0.9% and Heparin 10 units/ml or 100 units/ml   Complete by: As directed    Home infusion instructions - [facility]   Complete by: As directed  Instructions: Flush IV access with Sodium Chloride 0.9% and Heparin 10units/ml or 100units/ml   Change dressing on IV access line: Weekly and PRN   Instructions Cath Flo $Remove'2mg'uFIAbNA$ : Administer for PICC Line occlusion and as ordered by physician for other access device   [facility]  pharmacist to adjust dose for: Vancomycin, Aminoglycosides and other anti-infective therapies as requested by physician   Increase activity slowly   Complete by: As directed    Leave dressing on - Keep it clean, dry, and intact until clinic visit   Complete by: As directed    Method of administration may be changed at the discretion of home infusion pharmacist based upon assessment of the patient and/or caregiver's ability to self-administer the medication ordered   Complete by: As directed       Allergies as of 02/13/2021   No Known Allergies      Medication List     STOP taking these medications    diltiazem 180 MG 24 hr capsule Commonly known as: CARDIZEM CD   HYDROcodone-acetaminophen 5-325 MG tablet Commonly known as: NORCO/VICODIN   sulfamethoxazole-trimethoprim 800-160 MG tablet Commonly known as: BACTRIM DS       TAKE these medications    cefTRIAXone  IVPB Commonly known as: ROCEPHIN Inject 2 g into the vein daily. Indication:  Septic Arthritis First Dose: Yes Last Day of Therapy:  03/25/21 Labs - Once weekly:  CBC/D and BMP, Labs - Every other week:  ESR and CRP Method of administration: IV Push Method of administration may be changed at the discretion of home infusion pharmacist based upon assessment of the patient and/or caregiver's ability to self-administer the medication ordered.   daptomycin  IVPB Commonly known as: CUBICIN Inject 500 mg into the vein daily. Indication:  Septic Arthritis First Dose: Yes Last Day of Therapy:  03/25/21 Labs - Once weekly:  CBC/D, BMP, and CPK Labs - Every other week:  ESR and CRP Method of administration: IV Push Method of administration may be changed at the discretion of home infusion pharmacist based upon assessment of the patient and/or caregiver's ability to self-administer the medication ordered.   losartan 100 MG tablet Commonly known as: COZAAR Take 100 mg by mouth daily.   meloxicam 7.5 MG tablet Commonly  known as: MOBIC Take 7.5 mg by mouth daily.   metFORMIN 750 MG 24 hr tablet Commonly known as: GLUCOPHAGE-XR Take 750 mg by mouth every evening.   traMADol 50 MG tablet Commonly known as: ULTRAM Take 50 mg by mouth every 6 (six) hours as needed for moderate pain.   Vitamin D (Ergocalciferol) 1.25 MG (50000 UNIT) Caps capsule Commonly known as: DRISDOL Take 50,000 Units by mouth every Friday.               Discharge Care Instructions  (From admission, onward)           Start     Ordered   02/13/21 0000  Leave dressing on - Keep it clean, dry, and intact until clinic visit        02/13/21 1339   02/12/21 0000  Change dressing on IV access line weekly and PRN  (Home infusion instructions - [facility] )        02/12/21 1505            Follow-up Information     [facility] Follow up.   Why: [place], [facility] Follow up.   Specialty: Home Health Services Contact information: [place] [address] [phone]                No Known Allergies  Consultations: Hand surgery Infectious disease   Procedures/Studies: DG Wrist Complete Right  Result Date: 02/03/2021 CLINICAL DATA:  Pain, swelling, fever EXAM: RIGHT WRIST - COMPLETE 3+ VIEW COMPARISON:  01/13/2016 FINDINGS: Degenerative changes in the radiocarpal joint with joint space narrowing, subchondral sclerosis and cyst formation. Diffuse soft tissue swelling. No acute bony abnormality. Specifically, no fracture, subluxation, or dislocation. IMPRESSION: Degenerative changes in the radiocarpal joint. Diffuse soft tissue swelling. No acute bony abnormality. Electronically Signed   By: Rolm Baptise M.D.   On: 02/03/2021 17:18   CT Soft Tissue Neck W Contrast  Result Date: 02/04/2021 CLINICAL DATA:  Lymphadenopathy and fever EXAM: CT NECK WITH CONTRAST TECHNIQUE: Multidetector CT imaging of the neck was performed  using the standard protocol following the bolus administration of intravenous contrast. CONTRAST:  73mL OMNIPAQUE IOHEXOL 350 MG/ML SOLN COMPARISON:  None. FINDINGS: PHARYNX AND LARYNX: The nasopharynx, oropharynx and larynx are normal. Visible portions of the oral cavity, tongue base and floor of mouth are normal. Normal epiglottis, vallecula and pyriform sinuses. The larynx is normal. No retropharyngeal abscess, effusion or lymphadenopathy. SALIVARY GLANDS: Normal parotid, submandibular and sublingual glands. THYROID: Normal. LYMPH NODES: No enlarged or abnormal density lymph nodes. VASCULAR: Mild atherosclerotic calcification in the right carotid bifurcation. LIMITED INTRACRANIAL: Normal. VISUALIZED ORBITS: Normal. MASTOIDS AND VISUALIZED PARANASAL SINUSES: No fluid levels or advanced mucosal thickening. No mastoid effusion. SKELETON: No bony spinal canal stenosis. No lytic or blastic lesions. Multilevel degenerative disc disease. UPPER CHEST: Clear. OTHER: None. IMPRESSION: 1. No cervical lymphadenopathy. 2. Normal pharynx and larynx. 3. Mild atherosclerotic calcification at the right carotid bifurcation. Electronically Signed   By: Ulyses Jarred M.D.   On: 02/04/2021 02:14   MR HAND RIGHT WO CONTRAST  Result Date: 02/06/2021 CLINICAL DATA:  Septic arthritis suspected, hand, no prior imaging EXAM: MRI OF THE RIGHT HAND WITHOUT CONTRAST TECHNIQUE: Multiplanar, multisequence MR imaging of the right hand was performed. No intravenous contrast was administered. COMPARISON:  02/03/2021 radiograph FINDINGS: Bones/Joint/Cartilage There is severe radiocarpal arthritis with subchondral cystic change and bony productive change at the DRUJ. There is scapholunate dissociation with mild proximal migration of the capitate. There is bony edema signal within the lunate and distal radius, likely reactive from arthritis. There is no definitive osteomyelitis. Mild radiocarpal, DRUJ, and intercarpal effusion. Ligaments Torn  scapholunate ligament. Mild subluxation of the ECU tendon compatible with ECU subsheath tear. Muscles/Tendons/Soft tissues There is diffuse flexor tenosynovitis in the hand and wrist, with distention of the ulnar and radial bursa in the palm and extending proximally into the forearm (for perspective see coronal STIR image 13 for proximal-distal extent). There is a focal fluid collection deep to the second digit flexor tendons measuring 1.5 x 3.0 cm short axis and 2.6 cm in length (axial T2 image 24, coronal stir image 13). There is a fluid collection between the fourth and fifth metacarpals measuring 1.2 x 1.0 cm axially and 3.3 cm in length (axial T2 image 24, coronal stir image 9). There are also more superficial fluid collections along the dorsum of the hand and tracking along the ulnar aspect of the wrist superficially. Ulnar-sided more superficial collection measures approximately 3.5 x 1.4 cm axially (axial T2 image 32). Another collection courses along the are superficial aspect of the distal ulna  medially measuring 2.6 x 1.3 cm (axial T2 image 40). Diffuse soft tissue swelling of the hand. There is mild tenosynovial joint distension of multiple extensor compartments, most notable of extensor compartments 2, 3, 4, and 5 at the level of the distal radius (axial T2 image 40). IMPRESSION: Severe soft tissue infection of the hand and wrist with multifocal superficial and deep fluid collections, likely abscesses, and with diffuse flexor tenosynovitis as described above. Milder tenosynovitis of extensor compartments 2, 3, 4, and 5 at the level of the distal radius. 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 Bony edema signal within the carpus and distal radius is favored to be related to patient's pre-existing wrist arthritis, though osteomyelitis would be difficult to exclude given the severity of the soft tissue infection. These results were called by telephone at the time  of interpretation on 02/06/2021 at 9:10 pm to provider [facility] , who verbally acknowledged these results. Electronically Signed   By: Maurine Simmering M.D.   On: 02/06/2021 21:13   US RENAL  Result Date: 02/06/2021 CLINICAL DATA:  Acute renal injury. EXAM: RENAL / URINARY TRACT ULTRASOUND COMPLETE COMPARISON:  None. FINDINGS: Right Kidney: Renal measurements: 9.9 x 4.2 x 4.0 cm = volume: 86.2 mL. Echogenicity within normal limits. No mass or hydronephrosis visualized. Left Kidney: Renal measurements: 9.0 x 4.8 x 3.8 cm = volume: 85.34 mL. Echogenicity within normal limits. No mass or hydronephrosis visualized. Bladder: Appears normal for degree of bladder distention. Other: None. IMPRESSION: Normal renal ultrasound. Electronically Signed   By: Ronney Asters M.D.   On: 02/06/2021 18:03   [place] EKG SITE RITE  Result Date: 02/12/2021 If Site Rite image not attached, placement could not be confirmed due to current cardiac rhythm.  (Echo, Carotid, EGD, Colonoscopy, ERCP)    Subjective: Patient seen and examined.  Husband at the bedside.  Ready to go home.  Slightly nervous about taking medications by themselves.   Discharge Exam: Vitals:   02/13/21 0554 02/13/21 0722  BP: (!) 156/65 (!) 162/77  Pulse: (!) 59 80  Resp: 18 17  Temp: 99.1 F (37.3 C) 99.1 F (37.3 C)  SpO2: 99% 99%   Vitals:   02/12/21 1551 02/12/21 2119 02/13/21 0554 02/13/21 0722  BP: (!) 153/64 (!) 154/68 (!) 156/65 (!) 162/77  Pulse: 68 65 (!) 59 80  Resp: $Remo'18 18 18 17  'rxGTC$ Temp: 99.2 F (37.3 C) 99.1 F (37.3 C) 99.1 F (37.3 C) 99.1 F (37.3 C)  TempSrc: Oral Oral Oral Oral  SpO2: 97% 100% 99% 99%  Weight:      Height:        General: Pt is alert, awake, not in acute distress Cardiovascular: RRR, S1/S2 +, no rubs, no gallops Respiratory: CTA bilaterally, no wheezing, no rhonchi Abdominal: Soft, NT, ND, bowel sounds + Extremities:  Right hand on compression dressing and splint.  Distal neurovascular status intact.   Moderate swelling of the tip of the fingers but intact circulation.    The results of significant diagnostics from this hospitalization (including imaging, microbiology, ancillary and laboratory) are listed below for reference.     Microbiology: Recent Results (from the past 240 hour(s))  Resp Panel by RT-PCR (Flu A&B, Covid) Nasopharyngeal Swab     Status: None   Collection Time: 02/04/21  1:42 AM   Specimen: Nasopharyngeal Swab; Nasopharyngeal(NP) swabs in vial transport medium  Result Value Ref Range Status   SARS Coronavirus 2 by RT PCR NEGATIVE NEGATIVE  Final    Comment: (NOTE) SARS-CoV-2 target nucleic acids are NOT DETECTED.  The SARS-CoV-2 RNA is generally detectable in upper respiratory specimens during the acute phase of infection. The lowest concentration of SARS-CoV-2 viral copies this assay can detect is 138 copies/mL. A negative result does not preclude SARS-Cov-2 infection and should not be used as the sole basis for treatment or other patient management decisions. A negative result may occur with  improper specimen collection/handling, submission of specimen other than nasopharyngeal swab, presence of viral mutation(s) within the areas targeted by this assay, and inadequate number of viral copies(<138 copies/mL). A negative result must be combined with clinical observations, patient history, and epidemiological information. The expected result is Negative.  Fact Sheet for Patients:  EntrepreneurPulse.com.au  Fact Sheet for Healthcare Providers:  IncredibleEmployment.be  This test is no t yet approved or cleared by the [place] FDA and  has been authorized for detection and/or diagnosis of SARS-CoV-2 by FDA under an Emergency Use Authorization (EUA). This EUA will remain  in effect (meaning this test can be used) for the duration of the COVID-19 declaration under Section 564(b)(1) of the Act, 21 U.S.C.section  360bbb-3(b)(1), unless the authorization is terminated  or revoked sooner.       Influenza A by PCR NEGATIVE NEGATIVE Final   Influenza B by PCR NEGATIVE NEGATIVE Final    Comment: (NOTE) The Xpert Xpress SARS-CoV-2/FLU/RSV plus assay is intended as an aid in the diagnosis of influenza from Nasopharyngeal swab specimens and should not be used as a sole basis for treatment. Nasal washings and aspirates are unacceptable for Xpert Xpress SARS-CoV-2/FLU/RSV testing.  Fact Sheet for Patients: EntrepreneurPulse.com.au  Fact Sheet for Healthcare Providers: IncredibleEmployment.be  This test is not yet approved or cleared by the [place] FDA and has been authorized for detection and/or diagnosis of SARS-CoV-2 by FDA under an Emergency Use Authorization (EUA). This EUA will remain in effect (meaning this test can be used) for the duration of the COVID-19 declaration under Section 564(b)(1) of the Act, 21 U.S.C. section 360bbb-3(b)(1), unless the authorization is terminated or revoked.  Performed at [facility], [place] [address], [place], [place] [address]   Resp Panel by RT-PCR (Flu A&B, Covid) Nasopharyngeal Swab     Status: None   Collection Time: 02/06/21  2:31 PM   Specimen: Nasopharyngeal Swab; Nasopharyngeal(NP) swabs in vial transport medium  Result Value Ref Range Status   SARS Coronavirus 2 by RT PCR NEGATIVE NEGATIVE Final    Comment: (NOTE) SARS-CoV-2 target nucleic acids are NOT DETECTED.  The SARS-CoV-2 RNA is generally detectable in upper respiratory specimens during the acute phase of infection. The lowest concentration of SARS-CoV-2 viral copies this assay can detect is 138 copies/mL. A negative result does not preclude SARS-Cov-2 infection and should not be used as the sole basis for treatment or other patient management decisions. A negative result may occur with  improper specimen collection/handling, submission of  specimen other than nasopharyngeal swab, presence of viral mutation(s) within the areas targeted by this assay, and inadequate number of viral copies(<138 copies/mL). A negative result must be combined with clinical observations, patient history, and epidemiological information. The expected result is Negative.  Fact Sheet for Patients:  EntrepreneurPulse.com.au  Fact Sheet for Healthcare Providers:  IncredibleEmployment.be  This test is no t yet approved or cleared by the [place] FDA and  has been authorized for detection and/or diagnosis of SARS-CoV-2 by FDA under an Emergency Use Authorization (EUA). This EUA  will remain  in effect (meaning this test can be used) for the duration of the COVID-19 declaration under Section 564(b)(1) of the Act, 21 U.S.C.section 360bbb-3(b)(1), unless the authorization is terminated  or revoked sooner.       Influenza A by PCR NEGATIVE NEGATIVE Final   Influenza B by PCR NEGATIVE NEGATIVE Final    Comment: (NOTE) The Xpert Xpress SARS-CoV-2/FLU/RSV plus assay is intended as an aid in the diagnosis of influenza from Nasopharyngeal swab specimens and should not be used as a sole basis for treatment. Nasal washings and aspirates are unacceptable for Xpert Xpress SARS-CoV-2/FLU/RSV testing.  Fact Sheet for Patients: EntrepreneurPulse.com.au  Fact Sheet for Healthcare Providers: IncredibleEmployment.be  This test is not yet approved or cleared by the [place] FDA and has been authorized for detection and/or diagnosis of SARS-CoV-2 by FDA under an Emergency Use Authorization (EUA). This EUA will remain in effect (meaning this test can be used) for the duration of the COVID-19 declaration under Section 564(b)(1) of the Act, 21 U.S.C. section 360bbb-3(b)(1), unless the authorization is terminated or revoked.  Performed at [facility], [place] [address],  [place], [place] [address]   Aerobic/Anaerobic Culture w Gram Stain (surgical/deep wound)     Status: None   Collection Time: 02/06/21 10:35 PM   Specimen: Wound  Result Value Ref Range Status   Specimen Description WOUND  Final   Special Requests RIGHT WRIST WOUND SPEC A  Final   Gram Stain   Final    FEW SQUAMOUS EPITHELIAL CELLS PRESENT MODERATE WBC SEEN FEW GRAM POSITIVE COCCI    Culture   Final    No growth aerobically or anaerobically. Performed at [facility], [place] [address], [place], [place] [address]    Report Status 02/12/2021 FINAL  Final  Aerobic/Anaerobic Culture w Gram Stain (surgical/deep wound)     Status: None   Collection Time: 02/06/21 10:49 PM   Specimen: PATH Soft tissue  Result Value Ref Range Status   Specimen Description TISSUE  Final   Special Requests RIGHT WOUND TISSUE SPEC B  Final   Gram Stain   Final    FEW SQUAMOUS EPITHELIAL CELLS PRESENT FEW WBC SEEN NO ORGANISMS SEEN    Culture   Final    No growth aerobically or anaerobically. Performed at [facility], [place] [address] [place], [place] [address]    Report Status 02/12/2021 FINAL  Final  Culture, blood (routine x 2)     Status: None   Collection Time: 02/07/21  8:24 AM   Specimen: BLOOD LEFT WRIST  Result Value Ref Range Status   Specimen Description BLOOD LEFT WRIST  Final   Special Requests   Final    BOTTLES DRAWN AEROBIC AND ANAEROBIC Blood Culture adequate volume   Culture   Final    NO GROWTH 5 DAYS Performed at [facility], [place] [address] [place], [place] [address]    Report Status 02/12/2021 FINAL  Final  Culture, blood (routine x 2)     Status: None   Collection Time: 02/07/21  8:29 AM   Specimen: BLOOD RIGHT HAND  Result Value Ref Range Status   Specimen Description BLOOD RIGHT HAND  Final   Special Requests   Final    BOTTLES DRAWN AEROBIC AND ANAEROBIC Blood Culture adequate volume   Culture   Final    NO GROWTH 5 DAYS Performed at [place], [place] [address] [place], [place]  [address]    Report Status 02/12/2021 FINAL  Final  Surgical pcr screen     Status: None   Collection Time: 02/07/21 10:46 PM   Specimen: Nasal Mucosa; Nasal Swab  Result Value Ref Range Status   MRSA, PCR NEGATIVE NEGATIVE Final   Staphylococcus aureus NEGATIVE NEGATIVE Final    Comment: (NOTE) The Xpert SA Assay (FDA approved for NASAL specimens in patients 28 years of age and older), is one component of a comprehensive surveillance program. It is not intended to diagnose infection nor to guide or monitor treatment. Performed at [facility], [place] [address], [place], [place] [address]      Labs: BNP (last 3 results) No results for input(s): BNP in the last 8760 hours. Basic Metabolic Panel: Recent Labs  Lab 02/06/21 1346 02/07/21 0210 02/08/21 0218 02/09/21 0136 02/11/21 0050  NA 130* 134* 138 138 141  K 3.6 4.2 4.4 4.1 3.7  CL 99 106 109 111 111  CO2 21* 20* $Remov'22 22 23  'mnvFZs$ GLUCOSE 71 112* 88 118* 118*  BUN 30* $Remov'22 15 12 'XDSGDt$ 5*  CREATININE 2.00* 1.42* 0.94 0.86 0.59  CALCIUM 8.4* 8.0* 8.4* 7.9* 8.1*  MG  --   --  2.0 1.8  --    Liver Function Tests: Recent Labs  Lab 02/06/21 1346  AST 26  ALT 24  ALKPHOS 99  BILITOT 0.5  PROT 6.3*  ALBUMIN 2.3*   No results for input(s): LIPASE, AMYLASE in the last 168 hours. No results for input(s): AMMONIA in the last 168 hours. CBC: Recent Labs  Lab 02/06/21 1346 02/07/21 0210 02/08/21 0218 02/09/21 0136 02/11/21 0050  WBC 30.0* 27.6* 17.7* 13.2* 10.6*  NEUTROABS 28.2*  --  13.9* 9.4*  --   HGB 9.1* 8.4* 8.6* 8.0* 8.0*  HCT 28.4* 25.7* 26.1* 24.8* 24.9*  MCV 85.5 84.0 83.7 85.5 86.5  PLT 243 261 276 276 347   Cardiac Enzymes: Recent Labs  Lab 02/12/21 1401  CKTOTAL 96   BNP: Invalid input(s): POCBNP CBG: Recent Labs  Lab 02/12/21 1137 02/12/21 1727 02/12/21 2120 02/13/21 0720 02/13/21 1211  GLUCAP 97 96 97 77 70   D-Dimer No results for input(s): DDIMER in the  last 72 hours. Hgb A1c No results for input(s): HGBA1C in the last 72 hours. Lipid Profile No results for input(s): CHOL, HDL, LDLCALC, TRIG, CHOLHDL, LDLDIRECT in the last 72 hours. Thyroid function studies No results for input(s): TSH, T4TOTAL, T3FREE, THYROIDAB in the last 72 hours.  Invalid input(s): FREET3 Anemia work up No results for input(s): VITAMINB12, FOLATE, FERRITIN, TIBC, IRON, RETICCTPCT in the last 72 hours. Urinalysis    Component Value Date/Time   COLORURINE YELLOW 07/25/2009 0821   APPEARANCEUR CLEAR 07/25/2009 0821   LABSPEC 1.016 07/25/2009 0821   PHURINE 7.0 07/25/2009 0821   GLUCOSEU NEGATIVE 07/25/2009 0821   HGBUR NEGATIVE 07/25/2009 0821   BILIRUBINUR NEGATIVE 07/25/2009 0821   KETONESUR NEGATIVE 07/25/2009 0821   PROTEINUR NEGATIVE 07/25/2009 0821   UROBILINOGEN 1.0 07/25/2009 0821   NITRITE NEGATIVE 07/25/2009 0821   LEUKOCYTESUR  07/25/2009 0821    NEGATIVE MICROSCOPIC NOT DONE ON URINES WITH NEGATIVE PROTEIN, BLOOD, LEUKOCYTES, NITRITE, OR GLUCOSE <1000 mg/dL.   Sepsis Labs Invalid input(s): PROCALCITONIN,  WBC,  LACTICIDVEN Microbiology Recent Results (from the past 240 hour(s))  Resp Panel by RT-PCR (Flu A&B, Covid) Nasopharyngeal Swab     Status: None   Collection Time: 02/04/21  1:42 AM   Specimen: Nasopharyngeal Swab; Nasopharyngeal(NP) swabs in  vial transport medium  Result Value Ref Range Status   SARS Coronavirus 2 by RT PCR NEGATIVE NEGATIVE Final    Comment: (NOTE) SARS-CoV-2 target nucleic acids are NOT DETECTED.  The SARS-CoV-2 RNA is generally detectable in upper respiratory specimens during the acute phase of infection. The lowest concentration of SARS-CoV-2 viral copies this assay can detect is 138 copies/mL. A negative result does not preclude SARS-Cov-2 infection and should not be used as the sole basis for treatment or other patient management decisions. A negative result may occur with  improper specimen  collection/handling, submission of specimen other than nasopharyngeal swab, presence of viral mutation(s) within the areas targeted by this assay, and inadequate number of viral copies(<138 copies/mL). A negative result must be combined with clinical observations, patient history, and epidemiological information. The expected result is Negative.  Fact Sheet for Patients:  EntrepreneurPulse.com.au  Fact Sheet for Healthcare Providers:  IncredibleEmployment.be  This test is no t yet approved or cleared by the [place] FDA and  has been authorized for detection and/or diagnosis of SARS-CoV-2 by FDA under an Emergency Use Authorization (EUA). This EUA will remain  in effect (meaning this test can be used) for the duration of the COVID-19 declaration under Section 564(b)(1) of the Act, 21 U.S.C.section 360bbb-3(b)(1), unless the authorization is terminated  or revoked sooner.       Influenza A by PCR NEGATIVE NEGATIVE Final   Influenza B by PCR NEGATIVE NEGATIVE Final    Comment: (NOTE) The Xpert Xpress SARS-CoV-2/FLU/RSV plus assay is intended as an aid in the diagnosis of influenza from Nasopharyngeal swab specimens and should not be used as a sole basis for treatment. Nasal washings and aspirates are unacceptable for Xpert Xpress SARS-CoV-2/FLU/RSV testing.  Fact Sheet for Patients: EntrepreneurPulse.com.au  Fact Sheet for Healthcare Providers: IncredibleEmployment.be  This test is not yet approved or cleared by the [place] FDA and has been authorized for detection and/or diagnosis of SARS-CoV-2 by FDA under an Emergency Use Authorization (EUA). This EUA will remain in effect (meaning this test can be used) for the duration of the COVID-19 declaration under Section 564(b)(1) of the Act, 21 U.S.C. section 360bbb-3(b)(1), unless the authorization is terminated or revoked.  Performed at [facility], [place] [address] [place], [place] [address]   Resp Panel by RT-PCR (Flu A&B, Covid) Nasopharyngeal Swab     Status: None   Collection Time: 02/06/21  2:31 PM   Specimen: Nasopharyngeal Swab; Nasopharyngeal(NP) swabs in vial transport medium  Result Value Ref Range Status   SARS Coronavirus 2 by RT PCR NEGATIVE NEGATIVE Final    Comment: (NOTE) SARS-CoV-2 target nucleic acids are NOT DETECTED.  The SARS-CoV-2 RNA is generally detectable in upper respiratory specimens during the acute phase of infection. The lowest concentration of SARS-CoV-2 viral copies this assay can detect is 138 copies/mL. A negative result does not preclude SARS-Cov-2 infection and should not be used as the sole basis for treatment or other patient management decisions. A negative result may occur with  improper specimen collection/handling, submission of specimen other than nasopharyngeal swab, presence of viral mutation(s) within the areas targeted by this assay, and inadequate number of viral copies(<138 copies/mL). A negative result must be combined with clinical observations, patient history, and epidemiological information. The expected result is Negative.  Fact Sheet for Patients:  EntrepreneurPulse.com.au  Fact Sheet for Healthcare Providers:  IncredibleEmployment.be  This test is no t yet approved or cleared by the [place] and  has been authorized for detection and/or diagnosis of SARS-CoV-2 by FDA under an Emergency Use Authorization (EUA). This EUA will remain  in effect (meaning this test can be used) for the duration of the COVID-19 declaration under Section 564(b)(1) of the Act, 21 U.S.C.section 360bbb-3(b)(1), unless the authorization is terminated  or revoked sooner.       Influenza A by PCR NEGATIVE NEGATIVE Final   Influenza B by PCR NEGATIVE NEGATIVE Final    Comment: (NOTE) The Xpert Xpress SARS-CoV-2/FLU/RSV plus assay is  intended as an aid in the diagnosis of influenza from Nasopharyngeal swab specimens and should not be used as a sole basis for treatment. Nasal washings and aspirates are unacceptable for Xpert Xpress SARS-CoV-2/FLU/RSV testing.  Fact Sheet for Patients: EntrepreneurPulse.com.au  Fact Sheet for Healthcare Providers: IncredibleEmployment.be  This test is not yet approved or cleared by the [place] FDA and has been authorized for detection and/or diagnosis of SARS-CoV-2 by FDA under an Emergency Use Authorization (EUA). This EUA will remain in effect (meaning this test can be used) for the duration of the COVID-19 declaration under Section 564(b)(1) of the Act, 21 U.S.C. section 360bbb-3(b)(1), unless the authorization is terminated or revoked.  Performed at [facility], [place] [address] [place], [place] [address]   Aerobic/Anaerobic Culture w Gram Stain (surgical/deep wound)     Status: None   Collection Time: 02/06/21 10:35 PM   Specimen: Wound  Result Value Ref Range Status   Specimen Description WOUND  Final   Special Requests RIGHT WRIST WOUND SPEC A  Final   Gram Stain   Final    FEW SQUAMOUS EPITHELIAL CELLS PRESENT MODERATE WBC SEEN FEW GRAM POSITIVE COCCI    Culture   Final    No growth aerobically or anaerobically. Performed at [facility], [place] [address], [place], [place] [address]    Report Status 02/12/2021 FINAL  Final  Aerobic/Anaerobic Culture w Gram Stain (surgical/deep wound)     Status: None   Collection Time: 02/06/21 10:49 PM   Specimen: PATH Soft tissue  Result Value Ref Range Status   Specimen Description TISSUE  Final   Special Requests RIGHT WOUND TISSUE SPEC B  Final   Gram Stain   Final    FEW SQUAMOUS EPITHELIAL CELLS PRESENT FEW WBC SEEN NO ORGANISMS SEEN    Culture   Final    No growth aerobically or anaerobically. Performed at [facility], [place] [address] [place], [place]  [address]    Report Status 02/12/2021 FINAL  Final  Culture, blood (routine x 2)     Status: None   Collection Time: 02/07/21  8:24 AM   Specimen: BLOOD LEFT WRIST  Result Value Ref Range Status   Specimen Description BLOOD LEFT WRIST  Final   Special Requests   Final    BOTTLES DRAWN AEROBIC AND ANAEROBIC Blood Culture adequate volume   Culture   Final    NO GROWTH 5 DAYS Performed at [facility], [place] [address] [place], [place] [address]    Report Status 02/12/2021 FINAL  Final  Culture, blood (routine x 2)     Status: None   Collection Time: 02/07/21  8:29 AM   Specimen: BLOOD RIGHT HAND  Result Value Ref Range Status   Specimen Description BLOOD RIGHT HAND  Final   Special Requests   Final    BOTTLES DRAWN AEROBIC AND ANAEROBIC Blood Culture adequate volume   Culture   Final  NO GROWTH 5 DAYS Performed at [facility], [place] [address] [place], [place] [address]    Report Status 02/12/2021 FINAL  Final  Surgical pcr screen     Status: None   Collection Time: 02/07/21 10:46 PM   Specimen: Nasal Mucosa; Nasal Swab  Result Value Ref Range Status   MRSA, PCR NEGATIVE NEGATIVE Final   Staphylococcus aureus NEGATIVE NEGATIVE Final    Comment: (NOTE) The Xpert SA Assay (FDA approved for NASAL specimens in patients 90 years of age and older), is one component of a comprehensive surveillance program. It is not intended to diagnose infection nor to guide or monitor treatment. Performed at [facility], [place] [address] [place], [place] [address]      Time coordinating discharge:  35 minutes  SIGNED:   Barb Merino, MD  [facility] 02/13/2021, 1:39 PM

## 2021-02-13 NOTE — Progress Notes (Signed)
Pt discharged to home with husband via wheelchair with all belongings 

## 2021-02-13 NOTE — Progress Notes (Signed)
Discharge teaching given to pt. Pt verbalized understanding of all teaching. Pain medication given to pt as requested before discharge.

## 2021-02-18 ENCOUNTER — Encounter: Payer: Self-pay | Admitting: Infectious Disease

## 2021-02-20 ENCOUNTER — Emergency Department (HOSPITAL_COMMUNITY): Payer: BC Managed Care – PPO

## 2021-02-20 ENCOUNTER — Other Ambulatory Visit: Payer: Self-pay

## 2021-02-20 ENCOUNTER — Inpatient Hospital Stay (HOSPITAL_COMMUNITY)
Admission: EM | Admit: 2021-02-20 | Discharge: 2021-02-28 | DRG: 280 | Disposition: A | Discharge status: Home Health | Payer: BC Managed Care – PPO | Attending: Family Medicine | Admitting: Family Medicine

## 2021-02-20 ENCOUNTER — Encounter (HOSPITAL_COMMUNITY): Payer: Self-pay | Admitting: Emergency Medicine

## 2021-02-20 DIAGNOSIS — B370 Candidal stomatitis: Secondary | ICD-10-CM | POA: Diagnosis present

## 2021-02-20 DIAGNOSIS — R509 Fever, unspecified: Secondary | ICD-10-CM

## 2021-02-20 DIAGNOSIS — L02519 Cutaneous abscess of unspecified hand: Secondary | ICD-10-CM

## 2021-02-20 DIAGNOSIS — E876 Hypokalemia: Secondary | ICD-10-CM | POA: Diagnosis not present

## 2021-02-20 DIAGNOSIS — R195 Other fecal abnormalities: Secondary | ICD-10-CM

## 2021-02-20 DIAGNOSIS — I110 Hypertensive heart disease with heart failure: Principal | ICD-10-CM | POA: Diagnosis present

## 2021-02-20 DIAGNOSIS — J8281 Chronic eosinophilic pneumonia: Secondary | ICD-10-CM | POA: Diagnosis present

## 2021-02-20 DIAGNOSIS — E119 Type 2 diabetes mellitus without complications: Secondary | ICD-10-CM | POA: Diagnosis not present

## 2021-02-20 DIAGNOSIS — Z833 Family history of diabetes mellitus: Secondary | ICD-10-CM

## 2021-02-20 DIAGNOSIS — I248 Other forms of acute ischemic heart disease: Secondary | ICD-10-CM | POA: Diagnosis not present

## 2021-02-20 DIAGNOSIS — D62 Acute posthemorrhagic anemia: Secondary | ICD-10-CM

## 2021-02-20 DIAGNOSIS — N179 Acute kidney failure, unspecified: Secondary | ICD-10-CM | POA: Diagnosis present

## 2021-02-20 DIAGNOSIS — M009 Pyogenic arthritis, unspecified: Secondary | ICD-10-CM | POA: Diagnosis present

## 2021-02-20 DIAGNOSIS — J9601 Acute respiratory failure with hypoxia: Secondary | ICD-10-CM | POA: Diagnosis present

## 2021-02-20 DIAGNOSIS — Z792 Long term (current) use of antibiotics: Secondary | ICD-10-CM

## 2021-02-20 DIAGNOSIS — Z79899 Other long term (current) drug therapy: Secondary | ICD-10-CM

## 2021-02-20 DIAGNOSIS — I5023 Acute on chronic systolic (congestive) heart failure: Secondary | ICD-10-CM

## 2021-02-20 DIAGNOSIS — D649 Anemia, unspecified: Secondary | ICD-10-CM

## 2021-02-20 DIAGNOSIS — L02413 Cutaneous abscess of right upper limb: Secondary | ICD-10-CM | POA: Diagnosis present

## 2021-02-20 DIAGNOSIS — Z7984 Long term (current) use of oral hypoglycemic drugs: Secondary | ICD-10-CM

## 2021-02-20 DIAGNOSIS — K2970 Gastritis, unspecified, without bleeding: Secondary | ICD-10-CM

## 2021-02-20 DIAGNOSIS — K2991 Gastroduodenitis, unspecified, with bleeding: Secondary | ICD-10-CM | POA: Diagnosis present

## 2021-02-20 DIAGNOSIS — I10 Essential (primary) hypertension: Secondary | ICD-10-CM

## 2021-02-20 DIAGNOSIS — I214 Non-ST elevation (NSTEMI) myocardial infarction: Secondary | ICD-10-CM

## 2021-02-20 DIAGNOSIS — Z8249 Family history of ischemic heart disease and other diseases of the circulatory system: Secondary | ICD-10-CM

## 2021-02-20 DIAGNOSIS — I5033 Acute on chronic diastolic (congestive) heart failure: Secondary | ICD-10-CM | POA: Diagnosis present

## 2021-02-20 DIAGNOSIS — Z20822 Contact with and (suspected) exposure to covid-19: Secondary | ICD-10-CM | POA: Diagnosis present

## 2021-02-20 DIAGNOSIS — I509 Heart failure, unspecified: Secondary | ICD-10-CM | POA: Diagnosis not present

## 2021-02-20 DIAGNOSIS — I21A1 Myocardial infarction type 2: Secondary | ICD-10-CM | POA: Diagnosis present

## 2021-02-20 DIAGNOSIS — K5730 Diverticulosis of large intestine without perforation or abscess without bleeding: Secondary | ICD-10-CM | POA: Diagnosis present

## 2021-02-20 DIAGNOSIS — I959 Hypotension, unspecified: Secondary | ICD-10-CM | POA: Diagnosis present

## 2021-02-20 LAB — URINALYSIS, ROUTINE W REFLEX MICROSCOPIC
Bacteria, UA: NONE SEEN
Bilirubin Urine: NEGATIVE
Glucose, UA: NEGATIVE mg/dL
Hgb urine dipstick: NEGATIVE
Ketones, ur: NEGATIVE mg/dL
Leukocytes,Ua: NEGATIVE
Nitrite: NEGATIVE
Protein, ur: NEGATIVE mg/dL
Specific Gravity, Urine: 1.014 (ref 1.005–1.030)
pH: 7 (ref 5.0–8.0)

## 2021-02-20 LAB — CBC WITH DIFFERENTIAL/PLATELET
Abs Immature Granulocytes: 0.06 10*3/uL (ref 0.00–0.07)
Basophils Absolute: 0 10*3/uL (ref 0.0–0.1)
Basophils Relative: 0 %
Eosinophils Absolute: 0 10*3/uL (ref 0.0–0.5)
Eosinophils Relative: 0 %
HCT: 25.2 % — ABNORMAL LOW (ref 36.0–46.0)
Hemoglobin: 8.1 g/dL — ABNORMAL LOW (ref 12.0–15.0)
Immature Granulocytes: 1 %
Lymphocytes Relative: 14 %
Lymphs Abs: 1.3 10*3/uL (ref 0.7–4.0)
MCH: 27.6 pg (ref 26.0–34.0)
MCHC: 32.1 g/dL (ref 30.0–36.0)
MCV: 86 fL (ref 80.0–100.0)
Monocytes Absolute: 0.9 10*3/uL (ref 0.1–1.0)
Monocytes Relative: 10 %
Neutro Abs: 6.6 10*3/uL (ref 1.7–7.7)
Neutrophils Relative %: 75 %
Platelets: 313 10*3/uL (ref 150–400)
RBC: 2.93 MIL/uL — ABNORMAL LOW (ref 3.87–5.11)
RDW: 15.5 % (ref 11.5–15.5)
WBC: 8.8 10*3/uL (ref 4.0–10.5)
nRBC: 0 % (ref 0.0–0.2)

## 2021-02-20 LAB — COMPREHENSIVE METABOLIC PANEL
ALT: 24 U/L (ref 0–44)
AST: 33 U/L (ref 15–41)
Albumin: 2.6 g/dL — ABNORMAL LOW (ref 3.5–5.0)
Alkaline Phosphatase: 59 U/L (ref 38–126)
Anion gap: 12 (ref 5–15)
BUN: 11 mg/dL (ref 8–23)
CO2: 23 mmol/L (ref 22–32)
Calcium: 8.4 mg/dL — ABNORMAL LOW (ref 8.9–10.3)
Chloride: 108 mmol/L (ref 98–111)
Creatinine, Ser: 0.8 mg/dL (ref 0.44–1.00)
GFR, Estimated: >60 mL/min (ref >60)
Glucose, Bld: 76 mg/dL (ref 70–99)
Potassium: 3.5 mmol/L (ref 3.5–5.1)
Sodium: 143 mmol/L (ref 135–145)
Total Bilirubin: 1.3 mg/dL — ABNORMAL HIGH (ref 0.3–1.2)
Total Protein: 7.3 g/dL (ref 6.5–8.1)

## 2021-02-20 LAB — TROPONIN I (HIGH SENSITIVITY)
Troponin I (High Sensitivity): 394 ng/L (ref <18)
Troponin I (High Sensitivity): 498 ng/L (ref <18)
Troponin I (High Sensitivity): 527 ng/L (ref <18)

## 2021-02-20 LAB — PROTIME-INR
INR: 1.4 — ABNORMAL HIGH (ref 0.8–1.2)
Prothrombin Time: 17.5 seconds — ABNORMAL HIGH (ref 11.4–15.2)

## 2021-02-20 LAB — LACTIC ACID, PLASMA
Lactic Acid, Venous: 1.1 mmol/L (ref 0.5–1.9)
Lactic Acid, Venous: 1.3 mmol/L (ref 0.5–1.9)

## 2021-02-20 LAB — BRAIN NATRIURETIC PEPTIDE: B Natriuretic Peptide: 1255.2 pg/mL — ABNORMAL HIGH (ref 0.0–100.0)

## 2021-02-20 LAB — APTT: aPTT: 30 seconds (ref 24–36)

## 2021-02-20 MED ORDER — HEPARIN BOLUS VIA INFUSION
3500.0000 [IU] | Freq: Once | INTRAVENOUS | Status: AC
Start: 2021-02-20 — End: 2021-02-20
  Administered 2021-02-20: 3500 [IU] via INTRAVENOUS
  Filled 2021-02-20: qty 3500

## 2021-02-20 MED ORDER — IOHEXOL 350 MG/ML SOLN
60.0000 mL | Freq: Once | INTRAVENOUS | Status: AC | PRN
  Administered 2021-02-20: 60 mL via INTRAVENOUS

## 2021-02-20 MED ORDER — ACETAMINOPHEN 325 MG PO TABS
650.0000 mg | ORAL_TABLET | Freq: Four times a day (QID) | ORAL | Status: DC | PRN
  Administered 2021-02-22 – 2021-02-23 (×3): 650 mg via ORAL
  Filled 2021-02-20 (×3): qty 2

## 2021-02-20 MED ORDER — ACETAMINOPHEN 650 MG RE SUPP: 650.0000 mg | Freq: Four times a day (QID) | RECTAL | Status: DC | PRN

## 2021-02-20 MED ORDER — POLYETHYLENE GLYCOL 3350 17 G PO PACK: 17.0000 g | PACK | Freq: Every day | ORAL | Status: DC | PRN

## 2021-02-20 MED ORDER — LOSARTAN POTASSIUM 50 MG PO TABS
100.0000 mg | ORAL_TABLET | Freq: Once | ORAL | Status: AC
  Administered 2021-02-20: 100 mg via ORAL
  Filled 2021-02-20: qty 2

## 2021-02-20 MED ORDER — SODIUM CHLORIDE 0.9 % IV SOLN
2.0000 g | INTRAVENOUS | Status: AC
  Administered 2021-02-21 – 2021-02-24 (×5): 2 g via INTRAVENOUS
  Filled 2021-02-20 (×5): qty 20

## 2021-02-20 MED ORDER — SODIUM CHLORIDE 0.9% FLUSH
3.0000 mL | Freq: Two times a day (BID) | INTRAVENOUS | Status: DC
  Administered 2021-02-21 – 2021-02-28 (×14): 3 mL via INTRAVENOUS

## 2021-02-20 MED ORDER — LOSARTAN POTASSIUM 50 MG PO TABS
100.0000 mg | ORAL_TABLET | Freq: Every day | ORAL | Status: DC
  Administered 2021-02-21 – 2021-02-23 (×3): 100 mg via ORAL
  Filled 2021-02-20 (×4): qty 2

## 2021-02-20 MED ORDER — FUROSEMIDE 10 MG/ML IJ SOLN
40.0000 mg | Freq: Once | INTRAMUSCULAR | Status: AC
  Administered 2021-02-20: 40 mg via INTRAVENOUS
  Filled 2021-02-20: qty 4

## 2021-02-20 MED ORDER — HEPARIN (PORCINE) 25000 UT/250ML-% IV SOLN
1100.0000 [IU]/h | INTRAVENOUS | Status: DC
  Administered 2021-02-20: 750 [IU]/h via INTRAVENOUS
  Filled 2021-02-20 (×2): qty 250

## 2021-02-20 MED ORDER — FUROSEMIDE 10 MG/ML IJ SOLN
40.0000 mg | Freq: Two times a day (BID) | INTRAMUSCULAR | Status: DC
  Administered 2021-02-21 – 2021-02-23 (×5): 40 mg via INTRAVENOUS
  Filled 2021-02-20 (×6): qty 4

## 2021-02-20 MED ORDER — SODIUM CHLORIDE 0.9 % IV SOLN
500.0000 mg | Freq: Every day | INTRAVENOUS | Status: DC
  Administered 2021-02-21: 500 mg via INTRAVENOUS
  Filled 2021-02-20 (×2): qty 10

## 2021-02-20 MED ORDER — INSULIN ASPART 100 UNIT/ML IJ SOLN
0.0000 [IU] | Freq: Three times a day (TID) | INTRAMUSCULAR | Status: DC
  Administered 2021-02-21: 2 [IU] via SUBCUTANEOUS

## 2021-02-20 MED ORDER — ALBUTEROL SULFATE HFA 108 (90 BASE) MCG/ACT IN AERS: 2.0000 | INHALATION_SPRAY | RESPIRATORY_TRACT | Status: DC | PRN

## 2021-02-20 NOTE — H&P (Addendum)
History and Physical   Christy Higgins HGD:[id number] DOB: 12/16/1950 DOA: 02/20/2021  PCP: Shanon Rosser, PA-C   Patient coming from: Home  Chief Complaint: Shortness of breath  HPI: Christy Higgins is a 70 y.o. female with medical history significant of cellulitis, diabetes, hypertension, hyperlipidemia presenting for ongoing shortness of breath.  Patient presenting after 3 to 5 days of worsening shortness of breath.  Of note she was recently admitted from 10/19 until 10/26 with cellulitis and septic joint she is status postdebridement and repair.  She was discharged with a PICC line and has been on daily ceftriaxone and daptomycin.  She reports worsening shortness of breath that occurs mainly when lying down.  She also has worsening lower extremity edema.  In addition she is reporting a fever as high as 101 at home.  She has been receiving her daily antibiotics with a PICC line but did not receive them today.  She denies chills, chest pain, abdominal pain, constipation, diarrhea, nausea, vomiting.  ED Course: Vital signs in the ED significant for respiratory rate in the 20s to 30s.  Heart rate in the 50s to 70s.  Blood pressure in the 962I 297L systolic.  Requiring 2 L to maintain saturations due to saturating 86% on room air.  Lab work-up showed CMP with calcium 8.4, albumin 2.6, T bili 1.3.  CBC with hemoglobin stable at 8.1.  PT and INR mildly elevated at 17.5 and 1.4 respectively.  Troponin elevated at 394 and then again increased to 498 on repeat.  BNP elevated to greater than 1200.  Lactic acid normal x2.  Urinalysis and blood cultures both pending.  Chest x-ray showed diffuse interstitial opacities consistent with edema versus infection.  Low volume was noted and some small effusions.  X-ray of her right hand showed soft tissue swelling.  CT PE study showed no evidence of PE but did show the infiltrates and effusions mentioned in the chest x-ray also believed to be consistent with heart failure.   Patient received a dose of Lasix, was started on heparin and received her home losartan in the ED.  Cardiology was consulted and believe new heart failure plus minus ischemia given her elevated troponin have opted to start her on heparin pending further work-up.  Review of Systems: As per HPI otherwise all other systems reviewed and are negative.  Past Medical History:  Diagnosis Date   Diabetes mellitus    High cholesterol    Hypertension     Past Surgical History:  Procedure Laterality Date   CESAREAN SECTION     I & D EXTREMITY Right 02/06/2021   Procedure: IRRIGATION AND DEBRIDEMENT RIGHT HAND;  Surgeon: Iran Planas, MD;  Location: [facility];  Service: Orthopedics;  Laterality: Right;   I & D EXTREMITY Right 02/08/2021   Procedure: IRRIGATION AND DEBRIDEMENT EXTREMITY;  Surgeon: Orene Desanctis, MD;  Location: [facility];  Service: Orthopedics;  Laterality: Right;   I & D EXTREMITY Right 02/11/2021   Procedure: IRRIGATION AND DEBRIDEMENT RIGHT WRIST;  Surgeon: Iran Planas, MD;  Location: [facility];  Service: Orthopedics;  Laterality: Right;   TONSILLECTOMY      Social History  reports that she has never smoked. She has never used smokeless tobacco. She reports that she does not drink alcohol and does not use drugs.  No Known Allergies  Family History  Problem Relation Age of Onset   Hypertension Mother    Diabetes Father    Hypertension Father    Hypertension Sister  Diabetes Sister    Hypertension Sister    Diabetes Sister    Hypertension Sister    Diabetes Sister   Reviewed on admission  Prior to Admission medications   Medication Sig Start Date End Date Taking? Authorizing Provider  cefTRIAXone (ROCEPHIN) IVPB Inject 2 g into the vein daily. Indication:  Septic Arthritis First Dose: Yes Last Day of Therapy:  03/25/21 Labs - Once weekly:  CBC/D and BMP, Labs - Every other week:  ESR and CRP Method of administration: IV Push Method of administration may be changed at the  discretion of home infusion pharmacist based upon assessment of the patient and/or caregiver's ability to self-administer the medication ordered. 02/12/21 03/25/21  Barb Merino, MD  daptomycin (CUBICIN) IVPB Inject 500 mg into the vein daily. Indication:  Septic Arthritis First Dose: Yes Last Day of Therapy:  03/25/21 Labs - Once weekly:  CBC/D, BMP, and CPK Labs - Every other week:  ESR and CRP Method of administration: IV Push Method of administration may be changed at the discretion of home infusion pharmacist based upon assessment of the patient and/or caregiver's ability to self-administer the medication ordered. 02/12/21 03/25/21  Barb Merino, MD  losartan (COZAAR) 100 MG tablet Take 100 mg by mouth daily. 12/14/18   [provider]  meloxicam (MOBIC) 7.5 MG tablet Take 7.5 mg by mouth daily. 01/31/21   [provider]  metFORMIN (GLUCOPHAGE-XR) 750 MG 24 hr tablet Take 750 mg by mouth every evening. 12/14/18   [provider]  traMADol (ULTRAM) 50 MG tablet Take 50 mg by mouth every 6 (six) hours as needed for moderate pain. 01/31/21   [provider]  Vitamin D, Ergocalciferol, (DRISDOL) 1.25 MG (50000 UT) CAPS capsule Take 50,000 Units by mouth every Friday. 10/13/18   [provider]    Physical Exam: Vitals:   02/20/21 1945 02/20/21 1952 02/20/21 2000 02/20/21 2030  BP: (!) 175/87  (!) 171/88 (!) 165/150  Pulse: 75  66 63  Resp: (!) 26  (!) 23 (!) 31  Temp:      TempSrc:      SpO2: 100%  95% 97%  Weight:  62.1 kg    Height:  5' (1.524 m)     Physical Exam Constitutional:      General: She is not in acute distress.    Appearance: Normal appearance.  HENT:     Head: Normocephalic and atraumatic.     Mouth/Throat:     Mouth: Mucous membranes are moist.     Pharynx: Oropharynx is clear.  Eyes:     Extraocular Movements: Extraocular movements intact.     Pupils: Pupils are equal, round, and reactive to light.  Cardiovascular:      Rate and Rhythm: Normal rate and regular rhythm.     Pulses: Normal pulses.     Heart sounds: Normal heart sounds.  Pulmonary:     Effort: Pulmonary effort is normal. No respiratory distress.     Breath sounds: Rales present.  Abdominal:     General: Bowel sounds are normal. There is no distension.     Palpations: Abdomen is soft.     Tenderness: There is no abdominal tenderness.  Musculoskeletal:        General: No swelling or deformity.     Right lower leg: Edema present.     Left lower leg: Edema present.     Comments: Right hand bandaged and splinted  Skin:    General: Skin is warm and  dry.  Neurological:     General: No focal deficit present.     Mental Status: Mental status is at baseline.   Labs on Admission: I have personally reviewed following labs and imaging studies  CBC: Recent Labs  Lab 02/20/21 1620  WBC 8.8  NEUTROABS 6.6  HGB 8.1*  HCT 25.2*  MCV 86.0  PLT 568    Basic Metabolic Panel: Recent Labs  Lab 02/20/21 1620  NA 143  K 3.5  CL 108  CO2 23  GLUCOSE 76  BUN 11  CREATININE 0.80  CALCIUM 8.4*    GFR: Estimated Creatinine Clearance: 54.6 mL/min (by C-G formula based on SCr of 0.8 mg/dL).  Liver Function Tests: Recent Labs  Lab 02/20/21 1620  AST 33  ALT 24  ALKPHOS 59  BILITOT 1.3*  PROT 7.3  ALBUMIN 2.6*    Urine analysis:    Component Value Date/Time   COLORURINE COLORLESS (A) 02/20/2021 2031   APPEARANCEUR CLEAR 02/20/2021 2031   LABSPEC 1.014 02/20/2021 2031   PHURINE 7.0 02/20/2021 2031   GLUCOSEU NEGATIVE 02/20/2021 2031   HGBUR NEGATIVE 02/20/2021 2031   BILIRUBINUR NEGATIVE 02/20/2021 2031   KETONESUR NEGATIVE 02/20/2021 2031   PROTEINUR NEGATIVE 02/20/2021 2031   UROBILINOGEN 1.0 07/25/2009 0821   NITRITE NEGATIVE 02/20/2021 2031   LEUKOCYTESUR NEGATIVE 02/20/2021 2031    Radiological Exams on Admission: DG Chest 2 View  Result Date: 02/20/2021 CLINICAL DATA:  Shortness of breath EXAM: CHEST - 2 VIEW  COMPARISON:  01/13/2016 FINDINGS: Low volume examination with diffuse bilateral interstitial opacity and small pleural effusions. Left upper extremity PICC. The heart and mediastinum are normal. Disc degenerative disease of the thoracic spine. IMPRESSION: Low volume examination with diffuse bilateral interstitial opacity and small pleural effusions, consistent with edema or infection. Electronically Signed   By: Delanna Ahmadi M.D.   On: 02/20/2021 08:48   CT Angio Chest PE W/Cm &/Or Wo Cm  Result Date: 02/20/2021 CLINICAL DATA:  PE suspected, high prob EXAM: CT ANGIOGRAPHY CHEST WITH CONTRAST TECHNIQUE: Multidetector CT imaging of the chest was performed using the standard protocol during bolus administration of intravenous contrast. Multiplanar CT image reconstructions and MIPs were obtained to evaluate the vascular anatomy. CONTRAST:  46mL OMNIPAQUE IOHEXOL 350 MG/ML SOLN COMPARISON:  04/14/2011 FINDINGS: Cardiovascular: There is adequate opacification of the central pulmonary arteries. No intraluminal filling defect identified to suggest acute pulmonary embolism. The central pulmonary arteries are of normal caliber. No significant coronary artery calcification. Global cardiac size is mildly enlarged. No pericardial effusion. The thoracic aorta is unremarkable. Left subclavian central venous catheter tip is seen within the superior vena cava. Mediastinum/Nodes: No pathologic thoracic adenopathy. Visualized thyroid is unremarkable. Esophagus is unremarkable. Lungs/Pleura: There is moderate bilateral perihilar and suprahilar ground-glass pulmonary infiltrate in a "bat wing" configuration most suggestive of alveolar pulmonary edema. Moderate bilateral pleural effusions are present with compressive atelectasis of the lower lobes bilaterally. No pneumothorax. No central obstructing lesion. Upper Abdomen: No acute abnormality. Musculoskeletal: The osseous structures are age-appropriate. No acute bone abnormality.  Review of the MIP images confirms the above findings. IMPRESSION: No pulmonary embolism. Bilateral perihilar and suprahilar pulmonary infiltrates and moderate bilateral pleural effusions most in keeping with moderate cardiogenic failure. Mild cardiomegaly. Electronically Signed   By: Fidela [place]is[place] M.D.   On: 02/20/2021 19:37   DG Hand Complete Right  Result Date: 02/20/2021 CLINICAL DATA:  Postoperative fever after debridement of the right hand cellulitis. EXAM: RIGHT HAND - COMPLETE 3+ VIEW COMPARISON:  Right wrist x-ray 02/03/2021. MRI right hand 02/06/2021. FINDINGS: There is no acute fracture or dislocation identified. There is stable severe degenerative changes of the radiocarpal joint with joint space narrowing, subchondral sclerosis and cystic formation. There is diffuse marked soft tissue swelling of the hand and wrist. There is no radiopaque foreign body. There are no cortical erosions. IMPRESSION: 1. Marked diffuse soft tissue swelling of the hand and wrist. 2. No acute bony abnormality. Electronically Signed   By: Ronney Asters M.D.   On: 02/20/2021 16:57    EKG: Independently reviewed.  Normal sinus rhythm.  70 bpm.  QTC prolonged at 494.  Significant baseline wander and artifact.  Assessment/Plan Principal Problem:   Acute CHF (congestive heart failure) (HCC) Active Problems:   Septic arthritis (HCC)   Demand ischemia (HCC)   HTN (hypertension)   DM (diabetes mellitus) ([place])  Acute CHF NSTEMI Acute Respiratory Failure with hypoxia > Patient presenting with worsening shortness of breath and lower extremity edema.  More so lying down flat.  Found to be hypoxic to 86% on room air in the ED now saturating well on 2 L.  Intermittently tachypneic. > No history of CAD or heart failure.  Did have recent admission for septic joint on home IV antibiotics now.  Could be new onset heart failure.  Also, but less likely, possibility of bacteremia and valvular issue secondary to endocarditis  exists, no obvious murmur. > CT PE study in the ED was negative for PE did show evidence of edema and effusions consistent with heart failure picture. > Cardiology was consulted in ED and recommend heparin as this is her first event with no prior history.  Likely that NSTEMI is just demand ischemia with new onset heart failure. - Appreciate cardiology recommendations - Monitor on telemetry - Continue with heparin drip - Trend troponin - Lasix 40 mg IV twice daily - Strict I's/O, daily weights - Trend renal function and electrolytes  Cellulitis Septic joint > Recently admitted from 10/19 until 10/26 due to cellulitis and septic joint treated with debridement and IV antibiotics, sent home on PICC line.  Was prescribed 6-week course of ceftriaxone and daptomycin. > Has been taking her antibiotics daily with no issues.  Did not receive her dose today.  Does report having a fever of 101.  No fever in the ED.  No leukocytosis.  X-ray in the ED with only soft tissue swelling. - Continue with previously prescribed ceftriaxone and daptomycin therapy. - Trend fever curve and white count  Diabetes > Largely diet controlled - SSI  Hypertension - Continue home losartan  DVT prophylaxis: Heparin  Code Status:   Full  Family Communication:  Husband updated at bedside. Disposition Plan:   Patient is from:  Home  Anticipated DC to:  Home  Anticipated DC date:  1 to 5 days  Anticipated DC barriers: None  Consults called:  Cardiology, [facility] cardiology, consulted by EDP and they are following  Admission status:  Observation, telemetry  Severity of Illness: The appropriate patient status for this patient is OBSERVATION. Observation status is judged to be reasonable and necessary in order to provide the required intensity of service to ensure the patient's safety. The patient's presenting symptoms, physical exam findings, and initial radiographic and laboratory data in the context of their medical  condition is felt to place them at decreased risk for further clinical deterioration. Furthermore, it is anticipated that the patient will be medically stable for discharge from the hospital within 2 midnights of  admission.    Marcelyn Bruins MD [facility]  How to contact the [facility] Attending or Consulting provider [place] or covering provider during after hours Media, for this patient?   Check the care team in [facility] and look for a) attending/consulting [facility] provider listed and b) the [facility] team listed Log into [email or web address] and use [facility]'s universal password to access. If you do not have the password, please contact the hospital operator. Locate the [facility] provider you are looking for under [facility] and page to a number that you can be directly reached. If you still have difficulty reaching the provider, please page the [facility] (Director on Call) for the Hospitalists listed on amion for assistance.  02/20/2021, 9:25 PM

## 2021-02-20 NOTE — ED Triage Notes (Signed)
Pt c/o SOB X3 days.  She was just discharged from the hospital after having surgery on her right wrist.

## 2021-02-20 NOTE — Progress Notes (Signed)
ANTICOAGULATION CONSULT NOTE - Initial Consult  Pharmacy Consult for IV heparin  Indication: chest pain/ACS  No Known Allergies  Patient Measurements: Height: 5' (152.4 cm) Weight: 62.1 kg (137 lb) IBW/kg (Calculated) : 45.5 Heparin Dosing Weight: 58.5kg  Vital Signs: Temp: 98.7 F (37.1 C) (11/02 1435) Temp Source: Oral (11/02 1435) BP: 175/87 (11/02 1945) Pulse Rate: 75 (11/02 1945)  Labs: Recent Labs    02/20/21 1620 02/20/21 1713  HGB 8.1*  --   HCT 25.2*  --   PLT 313  --   APTT 30  --   LABPROT 17.5*  --   INR 1.4*  --   CREATININE 0.80  --   TROPONINIHS 394* 498*    Estimated Creatinine Clearance: 54.6 mL/min (by C-G formula based on SCr of 0.8 mg/dL).   Medical History: Past Medical History:  Diagnosis Date   Diabetes mellitus    High cholesterol    Hypertension    Assessment: 70 yo F presents with shortness of breath. Of note, recent discharge on 10/26 with 6 weeks of daptomycin and ceftriaxone for septic arthritis. No PTA anticoagulation. Hgb 8.1 (which seems to be baseline) and plts WNL. Pharmacy consulted to start IV heparin for NSTEMI vs demand ischemia.   Goal of Therapy:  Heparin level 0.3-0.7 units/ml Monitor platelets by anticoagulation protocol: Yes   Plan:  Heparin IV bolus 3500 units Start heparin IV 700 units/hr  Heparin level in 6-8h Obtain daily heparin level and CBC  Thank you for allowing pharmacy to participate in this patient's care.  Marja Kays, PharmD PGY1 Acute Care Resident  02/20/2021,8:19 PM

## 2021-02-20 NOTE — ED Notes (Signed)
Lab at BS for 2nd blood culture

## 2021-02-20 NOTE — ED Provider Notes (Signed)
[facility] EMERGENCY DEPARTMENT Provider Note   CSN: [id number] Arrival date & time: 02/20/21  0549     History Chief Complaint  Patient presents with   Shortness of Breath    Christy Higgins is a 70 y.o. female who presents with worsening shortness of breath for the last week. Patient was recently admitted on 10/19 for right hand cellulitis with multiple deeper wrist and hand abscesses.  She underwent debridement and washout in the OR and had subsequent procedures to repair wounds.  She was discharged from the hospital on 10/26 with a PICC line in the left upper arm on Rocephin and daptomycin daily at home.  She presents today with worsening shortness of breath.  Hypoxic to 86% in triage, not on oxygen at baseline.  Additionally she states that she has had fevers with T-max 101.5 F by home health nurse diagnosed 2 days ago.  She did not discuss this with her surgeon in follow-up yesterday.  She did see the surgeon yesterday who reported that she was healing well and she is a scheduled follow-up next week to have the sutures removed.  Patient also endorses bilateral lower extremity swelling which is new for her and dry cough.  Orthopnea, sleeping on 4 pillows and pillows beneath her legs.  Shortness of breath is only experienced when she is lying flat.  She denies any shortness of breath with activity.  I personally reviewed this patient's medical records.  She is history of type 2 diabetes and hypertension in addition to the above listed medical admission.  She is not on any anticoagulation.  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   Septic arthritis ([place]) 02/06/2021   AKI (acute kidney injury) ([place])    Hyponatremia    Cellulitis 02/04/2021    Past Surgical History:  Procedure Laterality Date   CESAREAN SECTION     I & D EXTREMITY Right 02/06/2021   Procedure: IRRIGATION AND DEBRIDEMENT RIGHT HAND;  Surgeon: Iran Planas, MD;  Location: [facility];  Service: Orthopedics;  Laterality: Right;   I & D EXTREMITY Right 02/08/2021   Procedure: IRRIGATION AND DEBRIDEMENT EXTREMITY;  Surgeon: Orene Desanctis, MD;  Location: [facility];  Service: Orthopedics;  Laterality: Right;   I & D EXTREMITY Right 02/11/2021   Procedure: IRRIGATION AND DEBRIDEMENT RIGHT WRIST;  Surgeon: Iran Planas, MD;  Location: [facility];  Service: Orthopedics;  Laterality: Right;   TONSILLECTOMY       OB History   No obstetric history on file.     Family History  Problem Relation Age of Onset   Hypertension Mother    Diabetes Father    Hypertension Father    Hypertension Sister    Diabetes Sister    Hypertension Sister    Diabetes Sister    Hypertension Sister    Diabetes Sister     Social History   Tobacco Use   Smoking status: Never   Smokeless tobacco: Never  Substance Use Topics   Alcohol use: No   Drug use: No    Home Medications  Prior to Admission medications   Medication Sig Start Date End Date Taking? Authorizing Provider  acetaminophen-codeine (TYLENOL #3) 300-30 MG tablet Take 1 tablet by mouth every 4 (four) hours as needed for moderate pain.   Yes [provider]  cefTRIAXone (ROCEPHIN) IVPB Inject 2 g into the vein daily. Indication:  Septic Arthritis First Dose: Yes Last Day of Therapy:  03/25/21 Labs - Once weekly:  CBC/D and BMP, Labs - Every other week:  ESR and CRP Method of administration: IV Push Method of administration may be changed at the discretion of home infusion pharmacist based upon assessment of the patient and/or caregiver's ability to  self-administer the medication ordered. 02/12/21 03/25/21 Yes Barb Merino, MD  daptomycin (CUBICIN) IVPB Inject 500 mg into the vein daily. Indication:  Septic Arthritis First Dose: Yes Last Day of Therapy:  03/25/21 Labs - Once weekly:  CBC/D, BMP, and CPK Labs - Every other week:  ESR and CRP Method of administration: IV Push Method of administration may be changed at the discretion of home infusion pharmacist based upon assessment of the patient and/or caregiver's ability to self-administer the medication ordered. 02/12/21 03/25/21 Yes Barb Merino, MD  HYDROcodone-acetaminophen (NORCO/VICODIN) 5-325 MG tablet Take 1 tablet by mouth every 6 (six) hours as needed for moderate pain.   Yes [provider]  losartan (COZAAR) 100 MG tablet Take 100 mg by mouth daily. 12/14/18  Yes [provider]  meloxicam (MOBIC) 7.5 MG tablet Take 7.5 mg by mouth daily. 01/31/21  Yes [provider]  metFORMIN (GLUCOPHAGE-XR) 750 MG 24 hr tablet Take 750 mg by mouth every evening. 12/14/18  Yes [provider]  traMADol (ULTRAM) 50 MG tablet Take 50 mg by mouth every 6 (six) hours as needed for moderate pain. 01/31/21  Yes [provider]    Allergies    Patient has no known allergies.  Review of Systems   Review of Systems  Constitutional:  Positive for fever. Negative for activity change, appetite change, chills and fatigue.  HENT: Negative.    Respiratory:  Positive for cough and shortness of breath. Negative for chest tightness, wheezing and stridor.   Cardiovascular:  Positive for leg swelling. Negative for chest pain and palpitations.  Gastrointestinal: Negative.   Genitourinary: Negative.   Musculoskeletal: Negative.   Skin: Negative.   Neurological: Negative.    Physical Exam Updated Vital Signs BP (!) 159/78   Pulse 64   Temp 98.7 F (37.1 C) (Oral)   Resp (!) 28   Ht 5' (1.524 m)   Wt 62.1 kg   SpO2 97%   BMI 26.76 kg/m   Physical  Exam Vitals and nursing note reviewed.  Constitutional:      Appearance: She is not ill-appearing or toxic-appearing.  HENT:     Head: Normocephalic and atraumatic.     Nose: Nose normal.     Mouth/Throat:     Mouth: Mucous membranes are moist.     Pharynx: Oropharynx is clear. Uvula midline. No oropharyngeal exudate or posterior oropharyngeal erythema.     Tonsils: No tonsillar exudate.  Eyes:     General: Lids are normal. Vision grossly intact.        Right eye: No discharge.        Left eye: No discharge.     Extraocular Movements: Extraocular movements intact.     Conjunctiva/sclera: Conjunctivae normal.     Pupils: Pupils are equal, round, and reactive to light.  Neck:  Trachea: Trachea and phonation normal.  Cardiovascular:     Rate and Rhythm: Normal rate and regular rhythm.     Pulses: Normal pulses.     Heart sounds: Normal heart sounds. No murmur heard. Pulmonary:     Effort: Pulmonary effort is normal. No tachypnea, bradypnea, accessory muscle usage, prolonged expiration or respiratory distress.     Breath sounds: Examination of the right-middle field reveals rales. Examination of the right-lower field reveals rales. Examination of the left-lower field reveals rales. Rales present. No wheezing.  Chest:     Chest wall: No mass, lacerations, deformity, swelling, tenderness, crepitus or edema.  Abdominal:     General: Bowel sounds are normal. There is no distension.     Palpations: Abdomen is soft.     Tenderness: There is no abdominal tenderness. There is no right CVA tenderness, left CVA tenderness, guarding or rebound.  Musculoskeletal:        General: No deformity.       Arms:     Cervical back: Normal range of motion and neck supple. No edema, rigidity or crepitus. No pain with movement, spinous process tenderness or muscular tenderness.     Right lower leg: No tenderness. 2+ Pitting Edema present.     Left lower leg: No tenderness. 2+ Pitting Edema present.   Lymphadenopathy:     Cervical: No cervical adenopathy.  Skin:    General: Skin is warm and dry.     Capillary Refill: Capillary refill takes less than 2 seconds.  Neurological:     General: No focal deficit present.     Mental Status: She is alert and oriented to person, place, and time. Mental status is at baseline.     Gait: Gait is intact. Gait normal.  Psychiatric:        Mood and Affect: Mood normal.    ED Results / Procedures / Treatments   Labs (all labs ordered are listed, but only abnormal results are displayed) Labs Reviewed  COMPREHENSIVE METABOLIC PANEL - Abnormal; Notable for the following components:      Result Value   Calcium 8.4 (*)    Albumin 2.6 (*)    Total Bilirubin 1.3 (*)    All other components within normal limits  CBC WITH DIFFERENTIAL/PLATELET - Abnormal; Notable for the following components:   RBC 2.93 (*)    Hemoglobin 8.1 (*)    HCT 25.2 (*)    All other components within normal limits  BRAIN NATRIURETIC PEPTIDE - Abnormal; Notable for the following components:   B Natriuretic Peptide 1,255.2 (*)    All other components within normal limits  PROTIME-INR - Abnormal; Notable for the following components:   Prothrombin Time 17.5 (*)    INR 1.4 (*)    All other components within normal limits  URINALYSIS, ROUTINE W REFLEX MICROSCOPIC - Abnormal; Notable for the following components:   Color, Urine COLORLESS (*)    All other components within normal limits  TROPONIN I (HIGH SENSITIVITY) - Abnormal; Notable for the following components:   Troponin I (High Sensitivity) 394 (*)    All other components within normal limits  TROPONIN I (HIGH SENSITIVITY) - Abnormal; Notable for the following components:   Troponin I (High Sensitivity) 498 (*)    All other components within normal limits  TROPONIN I (HIGH SENSITIVITY) - Abnormal; Notable for the following components:   Troponin I (High Sensitivity) 527 (*)    All other components within normal limits   CULTURE, BLOOD (ROUTINE  X 2)  CULTURE, BLOOD (ROUTINE X 2)  RESP PANEL BY RT-PCR (FLU A&B, COVID) ARPGX2  LACTIC ACID, PLASMA  LACTIC ACID, PLASMA  APTT  HEPARIN LEVEL (UNFRACTIONATED)  COMPREHENSIVE METABOLIC PANEL  CBC  CK  TROPONIN I (HIGH SENSITIVITY)    EKG EKG Interpretation  Date/Time:  Wednesday February 20 2021 07:23:14 EDT Ventricular Rate:  70 PR Interval:  158 QRS Duration: 72 QT Interval:  458 QTC Calculation: 494 R Axis:   47 Text Interpretation: Normal sinus rhythm with sinus arrhythmia Prolonged QT Abnormal ECG Confirmed by Pattricia Boss [id number]) on 02/20/2021 1:30:23 PM  Radiology DG Chest 2 View  Result Date: 02/20/2021 CLINICAL DATA:  Shortness of breath EXAM: CHEST - 2 VIEW COMPARISON:  01/13/2016 FINDINGS: Low volume examination with diffuse bilateral interstitial opacity and small pleural effusions. Left upper extremity PICC. The heart and mediastinum are normal. Disc degenerative disease of the thoracic spine. IMPRESSION: Low volume examination with diffuse bilateral interstitial opacity and small pleural effusions, consistent with edema or infection. Electronically Signed   By: Delanna Ahmadi M.D.   On: 02/20/2021 08:48   CT Angio Chest PE W/Cm &/Or Wo Cm  Result Date: 02/20/2021 CLINICAL DATA:  PE suspected, high prob EXAM: CT ANGIOGRAPHY CHEST WITH CONTRAST TECHNIQUE: Multidetector CT imaging of the chest was performed using the standard protocol during bolus administration of intravenous contrast. Multiplanar CT image reconstructions and MIPs were obtained to evaluate the vascular anatomy. CONTRAST:  27mL OMNIPAQUE IOHEXOL 350 MG/ML SOLN COMPARISON:  04/14/2011 FINDINGS: Cardiovascular: There is adequate opacification of the central pulmonary arteries. No intraluminal filling defect identified to suggest acute pulmonary embolism. The central pulmonary arteries are of normal caliber. No significant coronary artery calcification. Global cardiac size is mildly  enlarged. No pericardial effusion. The thoracic aorta is unremarkable. Left subclavian central venous catheter tip is seen within the superior vena cava. Mediastinum/Nodes: No pathologic thoracic adenopathy. Visualized thyroid is unremarkable. Esophagus is unremarkable. Lungs/Pleura: There is moderate bilateral perihilar and suprahilar ground-glass pulmonary infiltrate in a "bat wing" configuration most suggestive of alveolar pulmonary edema. Moderate bilateral pleural effusions are present with compressive atelectasis of the lower lobes bilaterally. No pneumothorax. No central obstructing lesion. Upper Abdomen: No acute abnormality. Musculoskeletal: The osseous structures are age-appropriate. No acute bone abnormality. Review of the MIP images confirms the above findings. IMPRESSION: No pulmonary embolism. Bilateral perihilar and suprahilar pulmonary infiltrates and moderate bilateral pleural effusions most in keeping with moderate cardiogenic failure. Mild cardiomegaly. Electronically Signed   By: Fidela Salisbury M.D.   On: 02/20/2021 19:37   DG Hand Complete Right  Result Date: 02/20/2021 CLINICAL DATA:  Postoperative fever after debridement of the right hand cellulitis. EXAM: RIGHT HAND - COMPLETE 3+ VIEW COMPARISON:  Right wrist x-ray 02/03/2021. MRI right hand 02/06/2021. FINDINGS: There is no acute fracture or dislocation identified. There is stable severe degenerative changes of the radiocarpal joint with joint space narrowing, subchondral sclerosis and cystic formation. There is diffuse marked soft tissue swelling of the hand and wrist. There is no radiopaque foreign body. There are no cortical erosions. IMPRESSION: 1. Marked diffuse soft tissue swelling of the hand and wrist. 2. No acute bony abnormality. Electronically Signed   By: Ronney Asters M.D.   On: 02/20/2021 16:57    Procedures .Critical Care Performed by: Emeline Darling, PA-C Authorized by: Emeline Darling, PA-C   Critical  care provider statement:    Critical care time (minutes):  45   Critical care was necessary to treat  or prevent imminent or life-threatening deterioration of the following conditions: NSTEMI.   Critical care was time spent personally by me on the following activities:  Development of treatment plan with patient or surrogate, discussions with consultants, evaluation of patient's response to treatment, examination of patient, obtaining history from patient or surrogate, ordering and performing treatments and interventions, ordering and review of laboratory studies, ordering and review of radiographic studies, pulse oximetry and re-evaluation of patient's condition   Medications Ordered in ED Medications  heparin ADULT infusion 100 units/mL (25000 units/291mL) (750 Units/hr Intravenous New Bag/Given 02/20/21 2028)  cefTRIAXone (ROCEPHIN) 2 g in sodium chloride 0.9 % 100 mL IVPB (has no administration in time range)  DAPTOmycin (CUBICIN) 500 mg in sodium chloride 0.9 % IVPB (has no administration in time range)  losartan (COZAAR) tablet 100 mg (has no administration in time range)  furosemide (LASIX) injection 40 mg (has no administration in time range)  sodium chloride flush (NS) 0.9 % injection 3 mL (has no administration in time range)  acetaminophen (TYLENOL) tablet 650 mg (has no administration in time range)    Or  acetaminophen (TYLENOL) suppository 650 mg (has no administration in time range)  polyethylene glycol (MIRALAX / GLYCOLAX) packet 17 g (has no administration in time range)  insulin aspart (novoLOG) injection 0-9 Units (has no administration in time range)  losartan (COZAAR) tablet 100 mg (100 mg Oral Given by Other 02/20/21 1900)  furosemide (LASIX) injection 40 mg (40 mg Intravenous Given 02/20/21 1946)  iohexol (OMNIPAQUE) 350 MG/ML injection 60 mL (60 mLs Intravenous Contrast Given 02/20/21 1912)  heparin bolus via infusion 3,500 Units (3,500 Units Intravenous Bolus from Bag 02/20/21  2028)    ED Course  I have reviewed the triage vital signs and the nursing notes.  Pertinent labs & imaging results that were available during my care of the patient were reviewed by me and considered in my medical decision making (see chart for details).    MDM Rules/Calculators/A&P                         70 year old female with recent surgery in the right hand who presents with shortness of breath x1 week, at rest as well as bilateral lower extremity swelling.  Shortness of breath differential diagnosis includes but is not limited to ACS, PE, pleural effusion, CHF, pneumonia, pericarditis, sepsis, hypovolemia.  Hypertensive and hypoxic on intake to 86% on room air.  Improved with 2 L supplemental oxygen by nasal cannula.  Afebrile.  Cardiac exam without murmur, pulmonary exam with rales in the lung bases bilaterally.  Bilateral lower extremity 2+ pitting edema.  Abdominal exam is benign.  Examination of the right surgical site without evidence of acute infection.  PICC line in the left upper arm appears well without erythema, induration, or purulent drainage from insertion site.  Sterile dressing replaced on 02/18/2021 by home health nurse.  Given reported fever at home as well as concern for new heart failure we will proceed with broad work-up.  Chest x-ray obtained in triage which did reveal bilateral interstitial opacities consistent with edema as well as small pleural effusions bilaterally.  EKG with normal sinus rhythm, prolonged QT but without ischemia.  Patient appears hemodynamically stable at this time.  Hypertensive but has not taken her medications at home today.  CBC with anemia at patient's baseline of 8.1.  CMP with mild bump bilirubin to 1.3.  Coags with INR 1.4.  Troponin significantly elevated to  394, BNP 1255.  Delta troponin elevated to 498.  Cardiology consulted, Dr. Einar Gip of [facility] cardiology, who agrees that this appears to be more likely demand ischemia but will proceed  with heparinization per their recommendation. Will continue to trend troponin.   UA unremarkable. Lactic normal 1.3.   New plain film of the right hand obtained due to some soft tissue swelling that did not reveal any acute bony changes to suggest osteomyelitis.  CT angiogram obtained due to new onset heart failure and elevated troponin and patient who is short interim postop.  Negative for PE.  Consult to hospitalist, Dr. Trilby Drummer, who is agreeable to seeing this patient admitting her to his service.  I appreciate his collaboration in the care of this patient.  Samika voiced understanding of her medical evaluation and treatment plan.  Each of her questions was answered to her expressed satisfaction.  She is amenable to plan for admission at this time.  This chart was dictated using voice recognition software, Dragon. Despite the best efforts of this provider to proofread and correct errors, errors may still occur which can change documentation meaning.   Final Clinical Impression(s) / ED Diagnoses Final diagnoses:  None    Rx / DC Orders ED Discharge Orders     None        Aura Dials 02/20/21 2224    Teressa Lower, MD 02/25/21 [phone]

## 2021-02-20 NOTE — ED Notes (Signed)
Pt transported to CT ?

## 2021-02-20 NOTE — ED Notes (Signed)
Pt returned from CT °

## 2021-02-21 ENCOUNTER — Observation Stay (HOSPITAL_COMMUNITY): Payer: BC Managed Care – PPO

## 2021-02-21 ENCOUNTER — Encounter (HOSPITAL_COMMUNITY): Payer: Self-pay | Admitting: Internal Medicine

## 2021-02-21 DIAGNOSIS — Z8249 Family history of ischemic heart disease and other diseases of the circulatory system: Secondary | ICD-10-CM | POA: Diagnosis not present

## 2021-02-21 DIAGNOSIS — I151 Hypertension secondary to other renal disorders: Secondary | ICD-10-CM

## 2021-02-21 DIAGNOSIS — I509 Heart failure, unspecified: Secondary | ICD-10-CM

## 2021-02-21 DIAGNOSIS — J9601 Acute respiratory failure with hypoxia: Secondary | ICD-10-CM

## 2021-02-21 DIAGNOSIS — Z792 Long term (current) use of antibiotics: Secondary | ICD-10-CM | POA: Diagnosis not present

## 2021-02-21 DIAGNOSIS — R0609 Other forms of dyspnea: Secondary | ICD-10-CM | POA: Diagnosis not present

## 2021-02-21 DIAGNOSIS — L02519 Cutaneous abscess of unspecified hand: Secondary | ICD-10-CM | POA: Diagnosis not present

## 2021-02-21 DIAGNOSIS — Z7984 Long term (current) use of oral hypoglycemic drugs: Secondary | ICD-10-CM | POA: Diagnosis not present

## 2021-02-21 DIAGNOSIS — D649 Anemia, unspecified: Secondary | ICD-10-CM | POA: Diagnosis not present

## 2021-02-21 DIAGNOSIS — B370 Candidal stomatitis: Secondary | ICD-10-CM | POA: Diagnosis present

## 2021-02-21 DIAGNOSIS — I5023 Acute on chronic systolic (congestive) heart failure: Secondary | ICD-10-CM | POA: Diagnosis not present

## 2021-02-21 DIAGNOSIS — J8281 Chronic eosinophilic pneumonia: Secondary | ICD-10-CM

## 2021-02-21 DIAGNOSIS — E119 Type 2 diabetes mellitus without complications: Secondary | ICD-10-CM | POA: Diagnosis not present

## 2021-02-21 DIAGNOSIS — D62 Acute posthemorrhagic anemia: Secondary | ICD-10-CM | POA: Diagnosis present

## 2021-02-21 DIAGNOSIS — L02413 Cutaneous abscess of right upper limb: Secondary | ICD-10-CM | POA: Diagnosis present

## 2021-02-21 DIAGNOSIS — I959 Hypotension, unspecified: Secondary | ICD-10-CM | POA: Diagnosis present

## 2021-02-21 DIAGNOSIS — E876 Hypokalemia: Secondary | ICD-10-CM | POA: Diagnosis not present

## 2021-02-21 DIAGNOSIS — M009 Pyogenic arthritis, unspecified: Secondary | ICD-10-CM

## 2021-02-21 DIAGNOSIS — R195 Other fecal abnormalities: Secondary | ICD-10-CM | POA: Diagnosis not present

## 2021-02-21 DIAGNOSIS — I110 Hypertensive heart disease with heart failure: Secondary | ICD-10-CM | POA: Diagnosis present

## 2021-02-21 DIAGNOSIS — R509 Fever, unspecified: Secondary | ICD-10-CM | POA: Diagnosis not present

## 2021-02-21 DIAGNOSIS — Z833 Family history of diabetes mellitus: Secondary | ICD-10-CM | POA: Diagnosis not present

## 2021-02-21 DIAGNOSIS — I214 Non-ST elevation (NSTEMI) myocardial infarction: Secondary | ICD-10-CM

## 2021-02-21 DIAGNOSIS — I248 Other forms of acute ischemic heart disease: Secondary | ICD-10-CM | POA: Diagnosis not present

## 2021-02-21 DIAGNOSIS — Z20822 Contact with and (suspected) exposure to covid-19: Secondary | ICD-10-CM | POA: Diagnosis present

## 2021-02-21 DIAGNOSIS — K5730 Diverticulosis of large intestine without perforation or abscess without bleeding: Secondary | ICD-10-CM | POA: Diagnosis present

## 2021-02-21 DIAGNOSIS — Z79899 Other long term (current) drug therapy: Secondary | ICD-10-CM | POA: Diagnosis not present

## 2021-02-21 DIAGNOSIS — N179 Acute kidney failure, unspecified: Secondary | ICD-10-CM | POA: Diagnosis present

## 2021-02-21 DIAGNOSIS — K2970 Gastritis, unspecified, without bleeding: Secondary | ICD-10-CM | POA: Diagnosis not present

## 2021-02-21 DIAGNOSIS — N2889 Other specified disorders of kidney and ureter: Secondary | ICD-10-CM

## 2021-02-21 DIAGNOSIS — I5033 Acute on chronic diastolic (congestive) heart failure: Secondary | ICD-10-CM | POA: Diagnosis present

## 2021-02-21 DIAGNOSIS — I21A1 Myocardial infarction type 2: Secondary | ICD-10-CM | POA: Diagnosis present

## 2021-02-21 DIAGNOSIS — K2991 Gastroduodenitis, unspecified, with bleeding: Secondary | ICD-10-CM | POA: Diagnosis present

## 2021-02-21 LAB — LIPID PANEL
Cholesterol: 145 mg/dL (ref 0–200)
HDL: 45 mg/dL (ref >40)
LDL Cholesterol: 86 mg/dL (ref 0–99)
Total CHOL/HDL Ratio: 3.2 RATIO
Triglycerides: 71 mg/dL (ref <150)
VLDL: 14 mg/dL (ref 0–40)

## 2021-02-21 LAB — CBC
HCT: 24.5 % — ABNORMAL LOW (ref 36.0–46.0)
Hemoglobin: 7.8 g/dL — ABNORMAL LOW (ref 12.0–15.0)
MCH: 27.2 pg (ref 26.0–34.0)
MCHC: 31.8 g/dL (ref 30.0–36.0)
MCV: 85.4 fL (ref 80.0–100.0)
Platelets: 261 10*3/uL (ref 150–400)
RBC: 2.87 MIL/uL — ABNORMAL LOW (ref 3.87–5.11)
RDW: 15.4 % (ref 11.5–15.5)
WBC: 7.5 10*3/uL (ref 4.0–10.5)
nRBC: 0.3 % — ABNORMAL HIGH (ref 0.0–0.2)

## 2021-02-21 LAB — CBG MONITORING, ED
Glucose-Capillary: 109 mg/dL — ABNORMAL HIGH (ref 70–99)
Glucose-Capillary: 87 mg/dL (ref 70–99)
Glucose-Capillary: 95 mg/dL (ref 70–99)

## 2021-02-21 LAB — ECHOCARDIOGRAM COMPLETE
AR max vel: 1.9 cm2
AV Area VTI: 1.87 cm2
AV Area mean vel: 2.08 cm2
AV Mean grad: 8 mmHg
AV Peak grad: 17.5 mmHg
Ao pk vel: 2.09 m/s
Area-P 1/2: 1.85 cm2
Height: 60 in
S' Lateral: 2.9 cm
Single Plane A4C EF: 69.5 %
Weight: 2192 oz

## 2021-02-21 LAB — CK: Total CK: 80 U/L (ref 38–234)

## 2021-02-21 LAB — COMPREHENSIVE METABOLIC PANEL
ALT: 28 U/L (ref 0–44)
AST: 43 U/L — ABNORMAL HIGH (ref 15–41)
Albumin: 2.4 g/dL — ABNORMAL LOW (ref 3.5–5.0)
Alkaline Phosphatase: 59 U/L (ref 38–126)
Anion gap: 9 (ref 5–15)
BUN: 10 mg/dL (ref 8–23)
CO2: 27 mmol/L (ref 22–32)
Calcium: 7.9 mg/dL — ABNORMAL LOW (ref 8.9–10.3)
Chloride: 106 mmol/L (ref 98–111)
Creatinine, Ser: 0.84 mg/dL (ref 0.44–1.00)
GFR, Estimated: >60 mL/min (ref >60)
Glucose, Bld: 86 mg/dL (ref 70–99)
Potassium: 3.3 mmol/L — ABNORMAL LOW (ref 3.5–5.1)
Sodium: 142 mmol/L (ref 135–145)
Total Bilirubin: 1 mg/dL (ref 0.3–1.2)
Total Protein: 6.7 g/dL (ref 6.5–8.1)

## 2021-02-21 LAB — GLUCOSE, CAPILLARY
Glucose-Capillary: 189 mg/dL — ABNORMAL HIGH (ref 70–99)
Glucose-Capillary: 61 mg/dL — ABNORMAL LOW (ref 70–99)

## 2021-02-21 LAB — TSH: TSH: 1.427 u[IU]/mL (ref 0.350–4.500)

## 2021-02-21 LAB — RESP PANEL BY RT-PCR (FLU A&B, COVID) ARPGX2
Influenza A by PCR: NEGATIVE
Influenza B by PCR: NEGATIVE
SARS Coronavirus 2 by RT PCR: NEGATIVE

## 2021-02-21 LAB — MAGNESIUM: Magnesium: 1.2 mg/dL — ABNORMAL LOW (ref 1.7–2.4)

## 2021-02-21 LAB — TROPONIN I (HIGH SENSITIVITY)
Troponin I (High Sensitivity): 823 ng/L (ref <18)
Troponin I (High Sensitivity): 413 ng/L (ref <18)
Troponin I (High Sensitivity): 419 ng/L (ref <18)

## 2021-02-21 LAB — HEPARIN LEVEL (UNFRACTIONATED)
Heparin Unfractionated: <0.1 IU/mL — ABNORMAL LOW (ref 0.30–0.70)
Heparin Unfractionated: 0.19 IU/mL — ABNORMAL LOW (ref 0.30–0.70)
Heparin Unfractionated: 0.62 IU/mL (ref 0.30–0.70)

## 2021-02-21 LAB — FERRITIN: Ferritin: 425 ng/mL — ABNORMAL HIGH (ref 11–307)

## 2021-02-21 LAB — C-REACTIVE PROTEIN: CRP: 12.3 mg/dL — ABNORMAL HIGH (ref <1.0)

## 2021-02-21 MED ORDER — POTASSIUM CHLORIDE CRYS ER 10 MEQ PO TBCR
20.0000 meq | EXTENDED_RELEASE_TABLET | Freq: Two times a day (BID) | ORAL | Status: DC
  Administered 2021-02-21 – 2021-02-28 (×15): 20 meq via ORAL
  Filled 2021-02-21 (×18): qty 2

## 2021-02-21 MED ORDER — HEPARIN BOLUS VIA INFUSION
2000.0000 [IU] | Freq: Once | INTRAVENOUS | Status: AC
  Administered 2021-02-21: 2000 [IU] via INTRAVENOUS
  Filled 2021-02-21: qty 2000

## 2021-02-21 MED ORDER — HEPARIN BOLUS VIA INFUSION
1500.0000 [IU] | Freq: Once | INTRAVENOUS | Status: AC
  Administered 2021-02-21: 1500 [IU] via INTRAVENOUS
  Filled 2021-02-21: qty 1500

## 2021-02-21 MED ORDER — SODIUM CHLORIDE 0.9% FLUSH
3.0000 mL | Freq: Two times a day (BID) | INTRAVENOUS | Status: DC
  Administered 2021-02-21 – 2021-02-28 (×13): 3 mL via INTRAVENOUS

## 2021-02-21 MED ORDER — ATORVASTATIN CALCIUM 80 MG PO TABS
80.0000 mg | ORAL_TABLET | Freq: Every day | ORAL | Status: DC
  Administered 2021-02-21 – 2021-02-28 (×8): 80 mg via ORAL
  Filled 2021-02-21 (×5): qty 1
  Filled 2021-02-21: qty 2
  Filled 2021-02-21 (×2): qty 1

## 2021-02-21 MED ORDER — VANCOMYCIN HCL 750 MG/150ML IV SOLN
750.0000 mg | INTRAVENOUS | Status: DC
  Administered 2021-02-22 – 2021-02-25 (×4): 750 mg via INTRAVENOUS
  Filled 2021-02-21 (×4): qty 150

## 2021-02-21 MED ORDER — MAGNESIUM SULFATE 2 GM/50ML IV SOLN
2.0000 g | Freq: Once | INTRAVENOUS | Status: AC
  Administered 2021-02-21: 2 g via INTRAVENOUS
  Filled 2021-02-21: qty 50

## 2021-02-21 MED ORDER — METOPROLOL TARTRATE 25 MG PO TABS
25.0000 mg | ORAL_TABLET | Freq: Two times a day (BID) | ORAL | Status: DC
  Administered 2021-02-21 – 2021-02-23 (×5): 25 mg via ORAL
  Filled 2021-02-21 (×6): qty 1

## 2021-02-21 MED ORDER — TRAMADOL HCL 50 MG PO TABS
50.0000 mg | ORAL_TABLET | Freq: Four times a day (QID) | ORAL | Status: DC | PRN
  Administered 2021-02-22 – 2021-02-28 (×10): 50 mg via ORAL
  Filled 2021-02-21 (×10): qty 1

## 2021-02-21 MED ORDER — VANCOMYCIN HCL IN DEXTROSE 1-5 GM/200ML-% IV SOLN
1000.0000 mg | Freq: Once | INTRAVENOUS | Status: AC
  Administered 2021-02-21: 1000 mg via INTRAVENOUS
  Filled 2021-02-21: qty 200

## 2021-02-21 MED ORDER — POTASSIUM CHLORIDE CRYS ER 20 MEQ PO TBCR
40.0000 meq | EXTENDED_RELEASE_TABLET | Freq: Once | ORAL | Status: AC
  Administered 2021-02-21: 40 meq via ORAL
  Filled 2021-02-21: qty 2

## 2021-02-21 MED ORDER — ASPIRIN 81 MG PO CHEW
81.0000 mg | CHEWABLE_TABLET | Freq: Once | ORAL | Status: AC
  Administered 2021-02-21: 81 mg via ORAL
  Filled 2021-02-21: qty 1

## 2021-02-21 NOTE — Consult Note (Signed)
Date of Admission:  02/20/2021          Reason for Consult: Fevers and dyspnea    Referring Provider: Marlin Canary, MD   Assessment:  Acute respiratory failure with hypoxia, likely due to volume overload heart failure exacerberation +/- eosinophilic pneumonia Reported fever of 101 degrees though not recorded in the ER Hand infection status post mutiple surgeries  by orthopedic hand surgery with unrevealing cultures currently on ceftriaxone and daptomycin Non STEMI  Plan:  With negative blood cultures so far and no evidence of PICC line infection I would think it is safe to proceed with cardiac catheterization With a chance that her daptomycin caused her acute respiratory decompensation with an eosinophilic pneumonia (seems much likely likely than heart failure exacerbation) we have changed from daptomycin to vancomycin Follow blood cultures and clinical course.  Once she has stabilized from a cardiac and heart failure exacerbation standpoint we can contemplate sending her out on vancomycin with ceftriaxone versus ceftriaxone and doxycycline.    Principal Problem:   Acute CHF (congestive heart failure) (HCC) Active Problems:   Septic arthritis (HCC)   Demand ischemia (HCC)   HTN (hypertension)   DM (diabetes mellitus) (HCC)   Acute respiratory failure with hypoxia (HCC)   CHF (congestive heart failure) (HCC)   Scheduled Meds:  atorvastatin  80 mg Oral Daily   furosemide  40 mg Intravenous Q12H   insulin aspart  0-9 Units Subcutaneous TID WC   losartan  100 mg Oral Daily   metoprolol tartrate  25 mg Oral BID   potassium chloride  20 mEq Oral BID   sodium chloride flush  3 mL Intravenous Q12H   sodium chloride flush  3 mL Intravenous Q12H   Continuous Infusions:  cefTRIAXone (ROCEPHIN)  IV Stopped (02/21/21 0104)   DAPTOmycin (CUBICIN)  IV Stopped (02/21/21 0142)   heparin 950 Units/hr (02/21/21 0624)   PRN Meds:.acetaminophen **OR** acetaminophen, polyethylene  glycol  HPI: Christy Higgins is a 70 y.o. female with past medical significant for diabetes mental mellitus hypertension who had developed right hand infection and seen in [facility] on February 03 2021 and was given Bactrim after plain films of failed to show significant infection.  She had had aspiration of the joint which had had 1002 and 45 white blood cells with 88% neutrophils.  She was discharged on oral Bactrim but had worsening of her swelling and pain returned on the 19th.  MRI shows severe soft tissue infection with multifocal superficial and deep fluid collections consistent with abscesses.  She was brought to the OR and had surgery on 10/19 with  Incision and drainage of wrist deep abscess right wrist volar aspect of forearm- Incision and drainage wrist deep abscess right wrist volar part carpal canal- Incision and drainage right wrist deep abscess dorsum of hand and carpometacarpal joint region- Right wrist arthrotomy with exploration and drainage- Right wrist carpal tunnel release-   Repeat surgery 10/21 with   Incision and drainage of wrist deep abscess right wrist volar aspect of forearm Incision and drainage wrist deep abscess right wrist volar part carpal canal Incision and drainage right wrist deep abscess dorsum of hand and carpometacarpal joint region Right wrist arthrotomy with exploration and drainage  Repeat surgery on 10/24:  Right wrist arthrotomy with exploration and drainage volar Right wrist arthrotomy with exploration and drainage dorsal     We had discharged her on IV ceftriaxone and daptomycin.  Her hand had been improving and she was  eagerly awaiting staples being removed.  She did develop dyspnea and orthopnea at home and came to the hospital.  She reportedly also had a temperature of 101 degrees.  In the ER she is found to chest x-ray findings of pulmonary edema also seen on CT angiogram.  She has been found to have non STEMI.  There was concern by Dr  Einar Gip re safety of proceeding with cardiac catheterization.  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  Therefore we will switch from daptomycin to vancomycin.  Once she is stable from cardiac standpoint we can come up with new plan for finishing her IV antibiotics.  I spent 83 minutes with the patient including than 50% of the time in face to face counseling of the patient re her hand infection, her presentation with dyspnea and what appears to be heart failure, describing the possibility of daptomycin induced eosinophilic pneumonia, along with personally reviewing chest x-ray and CT angiogram updated blood cultures CBC BMP along with review of medical records in preparation for the visit and during the visit and in coordination of her care.   Review of Systems: Review of Systems  Constitutional:  Positive for fever. Negative for chills, malaise/fatigue and weight loss.  HENT:  Negative for congestion and sore throat.   Eyes:  Negative for blurred vision and photophobia.  Respiratory:  Positive for shortness of breath. Negative for cough and wheezing.   Cardiovascular:  Positive for orthopnea and leg swelling. Negative for chest pain and palpitations.  Gastrointestinal:  Negative for abdominal pain, blood in stool, constipation, diarrhea, heartburn, melena, nausea and vomiting.  Genitourinary:  Negative for dysuria, flank pain and hematuria.  Musculoskeletal:  Negative for back pain, falls, joint pain and myalgias.  Skin:  Negative for itching and rash.  Neurological:  Negative for dizziness, focal weakness, loss of consciousness, weakness and headaches.  Endo/Heme/Allergies:  Does not bruise/bleed easily.  Psychiatric/Behavioral:  Negative for depression and suicidal ideas. The patient does not have insomnia.    Past Medical History:  Diagnosis Date   Diabetes mellitus     High cholesterol    Hypertension     Social History   Tobacco Use   Smoking status: Never   Smokeless tobacco: Never  Substance Use Topics   Alcohol use: No   Drug use: No    Family History  Problem Relation Age of Onset   Hypertension Mother    Diabetes Father    Hypertension Father    Hypertension Sister    Diabetes Sister    Hypertension Sister    Diabetes Sister    Hypertension Sister    Diabetes Sister    No Known Allergies  OBJECTIVE: Blood pressure 120/69, pulse 60, temperature 98.9 F (37.2 C), temperature source Oral, resp. rate 20, height 5' (1.524 m), weight 62.1 kg, SpO2 93 %.  Physical Exam Constitutional:      General: She is not in acute distress.    Appearance: Normal appearance. She is well-developed. She is not ill-appearing or diaphoretic.  HENT:     Head: Normocephalic and atraumatic.     Right Ear: Hearing and external ear normal.     Left Ear: Hearing and external ear normal.     Nose: No nasal deformity or rhinorrhea.  Eyes:     General: No scleral icterus.    Conjunctiva/sclera: Conjunctivae normal.  Right eye: Right conjunctiva is not injected.     Left eye: Left conjunctiva is not injected.     Pupils: Pupils are equal, round, and reactive to light.  Neck:     Vascular: No JVD.  Cardiovascular:     Rate and Rhythm: Normal rate and regular rhythm.     Heart sounds: Normal heart sounds, S1 normal and S2 normal. No murmur heard.   No friction rub.  Pulmonary:     Effort: No respiratory distress.     Breath sounds: No stridor. Rhonchi present. No wheezing.  Abdominal:     General: Bowel sounds are normal. There is no distension.     Palpations: Abdomen is soft.     Tenderness: There is no abdominal tenderness.  Musculoskeletal:        General: Normal range of motion.     Right shoulder: Normal.     Left shoulder: Normal.     Cervical back: Normal range of motion and neck supple.     Right hip: Normal.     Left hip: Normal.      Right knee: Normal.     Left knee: Normal.  Lymphadenopathy:     Head:     Right side of head: No submandibular, preauricular or posterior auricular adenopathy.     Left side of head: No submandibular, preauricular or posterior auricular adenopathy.     Cervical: No cervical adenopathy.     Right cervical: No superficial or deep cervical adenopathy.    Left cervical: No superficial or deep cervical adenopathy.  Skin:    General: Skin is warm and dry.     Coloration: Skin is not pale.     Findings: No abrasion, bruising, ecchymosis, erythema, lesion or rash.     Nails: There is no clubbing.  Neurological:     General: No focal deficit present.     Mental Status: She is alert and oriented to person, place, and time.     Sensory: No sensory deficit.     Coordination: Coordination normal.  Psychiatric:        Attention and Perception: She is attentive.        Mood and Affect: Mood normal.        Speech: Speech normal.        Behavior: Behavior normal. Behavior is cooperative.        Thought Content: Thought content normal.        Judgment: Judgment normal.     Left PICC line site is clean.  Right hand is wrapped and per her board is continue to improve   Lab Results Lab Results  Component Value Date   WBC 7.5 02/21/2021   HGB 7.8 (L) 02/21/2021   HCT 24.5 (L) 02/21/2021   MCV 85.4 02/21/2021   PLT 261 02/21/2021    Lab Results  Component Value Date   CREATININE 0.84 02/21/2021   BUN 10 02/21/2021   NA 142 02/21/2021   K 3.3 (L) 02/21/2021   CL 106 02/21/2021   CO2 27 02/21/2021    Lab Results  Component Value Date   ALT 28 02/21/2021   AST 43 (H) 02/21/2021   ALKPHOS 59 02/21/2021   BILITOT 1.0 02/21/2021     Microbiology: Recent Results (from the past 240 hour(s))  Blood Culture (routine x 2)     Status: None (Preliminary result)   Collection Time: 02/20/21  4:20 PM   Specimen: BLOOD  Result Value Ref Range Status  Specimen Description BLOOD PICC  LINE  Final   Special Requests   Final    BOTTLES DRAWN AEROBIC AND ANAEROBIC Blood Culture adequate volume   Culture   Final    NO GROWTH < 12 HOURS Performed at [facility], [place] [address] [place], [place] [address]    Report Status PENDING  Incomplete  Blood Culture (routine x 2)     Status: None (Preliminary result)   Collection Time: 02/20/21  5:13 PM   Specimen: BLOOD  Result Value Ref Range Status   Specimen Description BLOOD SITE NOT SPECIFIED  Final   Special Requests   Final    BOTTLES DRAWN AEROBIC AND ANAEROBIC Blood Culture adequate volume   Culture   Final    NO GROWTH < 24 HOURS Performed at [facility], [place] [address], [place], [place] [address]    Report Status PENDING  Incomplete  Resp Panel by RT-PCR (Flu A&B, Covid) Nasopharyngeal Swab     Status: None   Collection Time: 02/20/21  8:55 PM   Specimen: Nasopharyngeal Swab; Nasopharyngeal(NP) swabs in vial transport medium  Result Value Ref Range Status   SARS Coronavirus 2 by RT PCR NEGATIVE NEGATIVE Final    Comment: (NOTE) SARS-CoV-2 target nucleic acids are NOT DETECTED.  The SARS-CoV-2 RNA is generally detectable in upper respiratory specimens during the acute phase of infection. The lowest concentration of SARS-CoV-2 viral copies this assay can detect is 138 copies/mL. A negative result does not preclude SARS-Cov-2 infection and should not be used as the sole basis for treatment or other patient management decisions. A negative result may occur with  improper specimen collection/handling, submission of specimen other than nasopharyngeal swab, presence of viral mutation(s) within the areas targeted by this assay, and inadequate number of viral copies(<138 copies/mL). A negative result must be combined with clinical observations, patient history, and epidemiological information. The expected result is Negative.  Fact Sheet for Patients:  EntrepreneurPulse.com.au  Fact  Sheet for Healthcare Providers:  IncredibleEmployment.be  This test is no t yet approved or cleared by the [place] FDA and  has been authorized for detection and/or diagnosis of SARS-CoV-2 by FDA under an Emergency Use Authorization (EUA). This EUA will remain  in effect (meaning this test can be used) for the duration of the COVID-19 declaration under Section 564(b)(1) of the Act, 21 U.S.C.section 360bbb-3(b)(1), unless the authorization is terminated  or revoked sooner.       Influenza A by PCR NEGATIVE NEGATIVE Final   Influenza B by PCR NEGATIVE NEGATIVE Final    Comment: (NOTE) The Xpert Xpress SARS-CoV-2/FLU/RSV plus assay is intended as an aid in the diagnosis of influenza from Nasopharyngeal swab specimens and should not be used as a sole basis for treatment. Nasal washings and aspirates are unacceptable for Xpert Xpress SARS-CoV-2/FLU/RSV testing.  Fact Sheet for Patients: EntrepreneurPulse.com.au  Fact Sheet for Healthcare Providers: IncredibleEmployment.be  This test is not yet approved or cleared by the [place] FDA and has been authorized for detection and/or diagnosis of SARS-CoV-2 by FDA under an Emergency Use Authorization (EUA). This EUA will remain in effect (meaning this test can be used) for the duration of the COVID-19 declaration under Section 564(b)(1) of the Act, 21 U.S.C. section 360bbb-3(b)(1), unless the authorization is terminated or revoked.  Performed at [facility], [place] [address], [place], [place] [address]     Alcide Evener, [facility]  [phone] pager  02/21/2021, 3:21 PM

## 2021-02-21 NOTE — Progress Notes (Signed)
  Echocardiogram 2D Echocardiogram has been performed.  Pieter Partridge 02/21/2021, 2:48 PM

## 2021-02-21 NOTE — ED Notes (Signed)
Patient was given a cup of ice. 

## 2021-02-21 NOTE — Progress Notes (Signed)
Heart Failure Nurse Navigator Progress Note  Following this admission for HV TOC readiness. Primary cardiology follows with [facility], last visit 01/24/2019. Possible LHC 11/4?   Pt more concerned with R wrist/stitches and changing/adding antibiotics.   Will continue to follow for education and follow up assistance if needed.   Ozella Rocks, MSN, RN Heart Failure Nurse Navigator [phone]

## 2021-02-21 NOTE — Progress Notes (Signed)
ANTICOAGULATION CONSULT NOTE - Initial Consult  Pharmacy Consult for Heparin Indication: chest pain/ACS  No Known Allergies  Patient Measurements: Height: 5' (152.4 cm) Weight: 62.1 kg (137 lb) IBW/kg (Calculated) : 45.5 Heparin Dosing Weight: 58.5 kg  Vital Signs: Temp: 98.9 F (37.2 C) (11/03 1400) Temp Source: Oral (11/03 1400) BP: 120/69 (11/03 1400) Pulse Rate: 60 (11/03 1400)  Labs: Recent Labs    02/20/21 1620 02/20/21 1713 02/20/21 2315 02/21/21 0425 02/21/21 0937 02/21/21 1113 02/21/21 1316  HGB 8.1*  --   --  7.8*  --   --   --   HCT 25.2*  --   --  24.5*  --   --   --   PLT 313  --   --  261  --   --   --   APTT 30  --   --   --   --   --   --   LABPROT 17.5*  --   --   --   --   --   --   INR 1.4*  --   --   --   --   --   --   HEPARINUNFRC  --   --   --  <0.10*  --   --  0.19*  CREATININE 0.80  --   --  0.84  --   --   --   CKTOTAL  --   --  80  --   --   --   --   TROPONINIHS 394*   < > 823*  --  413* 419*  --    < > = values in this interval not displayed.    Estimated Creatinine Clearance: 52 mL/min (by C-G formula based on SCr of 0.84 mg/dL).   Medical History: Past Medical History:  Diagnosis Date   Diabetes mellitus    High cholesterol    Hypertension     Medications:  Medications Prior to Admission  Medication Sig Dispense Refill Last Dose   acetaminophen-codeine (TYLENOL #3) 300-30 MG tablet Take 1 tablet by mouth every 4 (four) hours as needed for moderate pain.   02/19/2021   cefTRIAXone (ROCEPHIN) IVPB Inject 2 g into the vein daily. Indication:  Septic Arthritis First Dose: Yes Last Day of Therapy:  03/25/21 Labs - Once weekly:  CBC/D and BMP, Labs - Every other week:  ESR and CRP Method of administration: IV Push Method of administration may be changed at the discretion of home infusion pharmacist based upon assessment of the patient and/or caregiver's ability to self-administer the medication ordered. 41 Units 0 02/19/2021    daptomycin (CUBICIN) IVPB Inject 500 mg into the vein daily. Indication:  Septic Arthritis First Dose: Yes Last Day of Therapy:  03/25/21 Labs - Once weekly:  CBC/D, BMP, and CPK Labs - Every other week:  ESR and CRP Method of administration: IV Push Method of administration may be changed at the discretion of home infusion pharmacist based upon assessment of the patient and/or caregiver's ability to self-administer the medication ordered. 41 Units 0 02/19/2021   HYDROcodone-acetaminophen (NORCO/VICODIN) 5-325 MG tablet Take 1 tablet by mouth every 6 (six) hours as needed for moderate pain.   02/19/2021   losartan (COZAAR) 100 MG tablet Take 100 mg by mouth daily.   02/19/2021   meloxicam (MOBIC) 7.5 MG tablet Take 7.5 mg by mouth daily.   02/19/2021   metFORMIN (GLUCOPHAGE-XR) 750 MG 24 hr tablet Take 750 mg by mouth  every evening.   02/19/2021   traMADol (ULTRAM) 50 MG tablet Take 50 mg by mouth every 6 (six) hours as needed for moderate pain.   02/19/2021   Scheduled:   atorvastatin  80 mg Oral Daily   furosemide  40 mg Intravenous Q12H   insulin aspart  0-9 Units Subcutaneous TID WC   losartan  100 mg Oral Daily   metoprolol tartrate  25 mg Oral BID   potassium chloride  20 mEq Oral BID   sodium chloride flush  3 mL Intravenous Q12H   sodium chloride flush  3 mL Intravenous Q12H   Infusions:   cefTRIAXone (ROCEPHIN)  IV Stopped (02/21/21 0104)   DAPTOmycin (CUBICIN)  IV Stopped (02/21/21 0142)   heparin 950 Units/hr (02/21/21 0624)   PRN: acetaminophen **OR** acetaminophen, polyethylene glycol  Assessment: 61 yof with a history of cellulitis, diabetes, hypertension, hyperlipidemia. Patient is presenting with shortness of breath and fever. Heparin per pharmacy consult placed for chest pain/ACS.  CT PE study in the ED was negative for PE. Patient is not on anticoagulation prior to arrival.  Patient started on 950 units/hr heparin drip. Heparin level this afternoon 0.19 (~7 hr level)  which is subtherapeutic  Hgb 7.8; plt 261  Goal of Therapy:  Heparin level 0.3-0.7 units/ml Monitor platelets by anticoagulation protocol: Yes   Plan:  Give 1500 unit bolus Increase heparin infusion to 1100 units/hr Check anti-Xa level in 8 hours and daily while on heparin Continue to monitor H&H and platelets  Lorelei Pont, PharmD, BCPS 02/21/2021 3:28 PM ED Clinical Pharmacist -  [phone]

## 2021-02-21 NOTE — Progress Notes (Addendum)
Pharmacy Antibiotic Note  Christy Higgins is a 70 y.o. female admitted on 02/20/2021 with hand infection/septic arthritis. 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  Pharmacy has been consulted for vancomycin dosing. SCr 0.84 and CrCl ~ 52 ml/min.   Plan: Vancomycin 1000 mg x 1 then 750 mg every 24 hours Predicted AUC 465 with Scr 1 Monitor renal function and levels as appropriate   Height: 5' (152.4 cm) Weight: 62.1 kg (137 lb) IBW/kg (Calculated) : 45.5  Temp (24hrs), Avg:98.9 F (37.2 C), Min:98.9 F (37.2 C), Max:98.9 F (37.2 C)  Recent Labs  Lab 02/20/21 1620 02/20/21 1713 02/21/21 0425  WBC 8.8  --  7.5  CREATININE 0.80  --  0.84  LATICACIDVEN 1.1 1.3  --     Estimated Creatinine Clearance: 52 mL/min (by C-G formula based on SCr of 0.84 mg/dL).    No Known Allergies   Thank you for allowing pharmacy to be a part of this patient's care.  Sharin Mons, PharmD, BCPS, BCIDP Infectious Diseases Clinical Pharmacist Phone: [phone] 02/21/2021 4:09 PM

## 2021-02-21 NOTE — Progress Notes (Signed)
ANTICOAGULATION CONSULT NOTE  Pharmacy Consult for heparin  Indication: chest pain/ACS  No Known Allergies  Patient Measurements: Height: 5' (152.4 cm) Weight: 62.1 kg (137 lb) IBW/kg (Calculated) : 45.5 Heparin Dosing Weight: 58.5kg  Vital Signs: BP: 129/62 (11/03 0400) Pulse Rate: 51 (11/03 0400)  Labs: Recent Labs    02/20/21 1620 02/20/21 1713 02/20/21 2013 02/20/21 2315 02/21/21 0425  HGB 8.1*  --   --   --  7.8*  HCT 25.2*  --   --   --  24.5*  PLT 313  --   --   --  261  APTT 30  --   --   --   --   LABPROT 17.5*  --   --   --   --   INR 1.4*  --   --   --   --   HEPARINUNFRC  --   --   --   --  <0.10*  CREATININE 0.80  --   --   --  0.84  CKTOTAL  --   --   --  80  --   TROPONINIHS 394* 498* 527* 823*  --      Estimated Creatinine Clearance: 52 mL/min (by C-G formula based on SCr of 0.84 mg/dL).  Assessment: 70 y.o. female with possible ACS for heparin  Goal of Therapy:  Heparin level 0.3-0.7 units/ml Monitor platelets by anticoagulation protocol: Yes   Plan:  Heparin 2000 units IV bolus, then increase heparin  950 units/hr Check heparin level in 8 hours.   Geannie Risen, PharmD, BCPS  02/21/2021,6:14 AM

## 2021-02-21 NOTE — Progress Notes (Signed)
ANTICOAGULATION CONSULT NOTE  Pharmacy Consult for Heparin Indication: chest pain/ACS  No Known Allergies  Patient Measurements: Height: 5' (152.4 cm) Weight: 55.6 kg (122 lb 9.6 oz) IBW/kg (Calculated) : 45.5 Heparin Dosing Weight: 58.5 kg  Vital Signs: Temp: 101.7 F (38.7 C) (11/03 1938) Temp Source: Oral (11/03 1938) BP: 130/66 (11/03 1938) Pulse Rate: 63 (11/03 1938)  Labs: Recent Labs    02/20/21 1620 02/20/21 1713 02/20/21 2315 02/21/21 0425 02/21/21 0937 02/21/21 1113 02/21/21 1316 02/21/21 2126  HGB 8.1*  --   --  7.8*  --   --   --   --   HCT 25.2*  --   --  24.5*  --   --   --   --   PLT 313  --   --  261  --   --   --   --   APTT 30  --   --   --   --   --   --   --   LABPROT 17.5*  --   --   --   --   --   --   --   INR 1.4*  --   --   --   --   --   --   --   HEPARINUNFRC  --   --   --  <0.10*  --   --  0.19* 0.62  CREATININE 0.80  --   --  0.84  --   --   --   --   CKTOTAL  --   --  80  --   --   --   --   --   TROPONINIHS 394*   < > 823*  --  413* 419*  --   --    < > = values in this interval not displayed.     Estimated Creatinine Clearance: 49.4 mL/min (by C-G formula based on SCr of 0.84 mg/dL).   Medical History: Past Medical History:  Diagnosis Date   Diabetes mellitus    High cholesterol    Hypertension     Medications:   Scheduled:   atorvastatin  80 mg Oral Daily   furosemide  40 mg Intravenous Q12H   insulin aspart  0-9 Units Subcutaneous TID WC   losartan  100 mg Oral Daily   metoprolol tartrate  25 mg Oral BID   potassium chloride  20 mEq Oral BID   sodium chloride flush  3 mL Intravenous Q12H   sodium chloride flush  3 mL Intravenous Q12H    Assessment: 70 yof with a history of cellulitis, diabetes, hypertension, hyperlipidemia. Patient is presenting with shortness of breath and fever. Heparin per pharmacy consult placed for chest pain/ACS.  CT PE study in the ED was negative for PE. Patient is not on anticoagulation  prior to arrival.  Patient started on 950 units/hr heparin drip. Heparin level this afternoon 0.19 (~7 hr level) which is subtherapeutic  Hgb 7.8; plt 261  11/3 PM update:  Heparin level therapeutic after rate increase  Goal of Therapy:  Heparin level 0.3-0.7 units/ml Monitor platelets by anticoagulation protocol: Yes   Plan:  Cont heparin at 1100 units/hr Heparin level with AM labs  Abran Duke, PharmD, BCPS Clinical Pharmacist Phone: [phone]

## 2021-02-21 NOTE — Consult Note (Signed)
CARDIOLOGY CONSULT NOTE  Patient ID: Christy Higgins MRN: [id number] DOB/AGE: November 14, 1950 70 y.o.  Admit date: 02/20/2021 Referring Physician  Eulogio Bear, DO Primary Physician:  Shanon Rosser, PA-C Reason for Consultation  CHF  Patient ID: Christy Higgins, female    DOB: [birth year], 70 y.o.   MRN: [id number]  Chief Complaint  Patient presents with   Shortness of Breath   HPI:    Christy Higgins  is a 70 y.o. Christy Higgins is a 70 y.o. African-American female patient with hypertension, type 2 diabetes mellitus, hyperlipidemia, presented to the hospital yesterday with 2 to 5 days of worsening shortness of breath.  Recently admitted on 02/06/2021 with a cellulitis involving right wrist and septic joint, underwent debridement, IV antibiotic therapy.  Patient had felt well after discharge on 02/13/2021, however 2 to 3 days ago started again having fever with chills, she recorded temperature of 102.2 yesterday.  Usually she has fever early in the morning and in the evenings.  Also 2 days ago she started noticing marked orthopnea.  Shortness of breath only with extremes of exertion but however mostly her dyspnea was in the form of orthopnea and inability to lay down which was getting worse over the past 2 days hence presented to the emergency room at 5 AM yesterday and has been in the emergency room for the past 24 hours.  Patient states that she is feeling slightly better after she got IV Lasix.  Denies chest pain or palpitations.  Past Medical History:  Diagnosis Date   Diabetes mellitus    High cholesterol    Hypertension    Past Surgical History:  Procedure Laterality Date   CESAREAN SECTION     I & D EXTREMITY Right 02/06/2021   Procedure: IRRIGATION AND DEBRIDEMENT RIGHT HAND;  Surgeon: Iran Planas, MD;  Location: [place];  Service: Orthopedics;  Laterality: Right;   I & D EXTREMITY Right 02/08/2021   Procedure: IRRIGATION AND DEBRIDEMENT EXTREMITY;  Surgeon: Orene Desanctis, MD;  Location: [place];  Service: Orthopedics;  Laterality: Right;   I & D EXTREMITY Right 02/11/2021   Procedure: IRRIGATION AND DEBRIDEMENT RIGHT WRIST;  Surgeon: Iran Planas, MD;  Location: [place];  Service: Orthopedics;  Laterality: Right;   TONSILLECTOMY     Social History   Tobacco Use   Smoking status: Never   Smokeless tobacco: Never  Substance Use Topics   Alcohol use: No    Family History  Problem Relation Age of Onset   Hypertension Mother    Diabetes Father    Hypertension Father    Hypertension Sister    Diabetes Sister    Hypertension Sister    Diabetes Sister    Hypertension Sister    Diabetes Sister     Marital Status: Married  ROS  Review of Systems  Constitutional: Positive for chills, decreased appetite and fever.  HENT: Negative.    Cardiovascular:  Positive for orthopnea and paroxysmal nocturnal dyspnea. Negative for chest pain, dyspnea on exertion and leg swelling.  Respiratory:  Positive for wheezing. Negative for cough, hemoptysis and sputum production.   Musculoskeletal:  Positive for arthritis (right wrist).  Gastrointestinal:  Negative for melena.  All other systems reviewed and are negative. Objective   Vitals with BMI 02/21/2021 02/21/2021 02/21/2021  Height - - -  Weight - - -  BMI - - -  Systolic 203 559 -  Diastolic 71 88 -  Pulse Higgins 84 62    Blood pressure (!) 150/71, pulse Christy Kitchen)  Higgins, temperature 98.7 F (37.1 C), temperature source Oral, resp. rate (!) 34, height 5' (1.524 m), weight 62.1 kg, SpO2 95 %.    Physical Exam Constitutional:      General: She is not in acute distress.    Appearance: She is well-developed.  Eyes:     Extraocular Movements: Extraocular movements intact.  Neck:     Vascular: Carotid bruit (bilateral) present. No JVD.  Cardiovascular:     Rate and Rhythm: Normal rate and regular rhythm.     Pulses: Intact distal pulses.     Heart sounds: Murmur heard.  Midsystolic murmur is present with a grade of 2/6 at the lower left  sternal border.    No gallop.  Pulmonary:     Effort: Pulmonary effort is normal.     Breath sounds: Examination of the right-middle field reveals rhonchi. Examination of the left-middle field reveals rhonchi. Examination of the right-lower field reveals rhonchi. Examination of the left-lower field reveals rhonchi. Rhonchi present.  Abdominal:     General: Bowel sounds are normal.     Palpations: Abdomen is soft.  Musculoskeletal:        General: Swelling (right wrist in cast, hand swollen and warm) present.     Right lower leg: Edema (1-2+ pitting edema) present.     Left lower leg: Edema (1-2+ pitting edema) present.  Skin:    General: Skin is warm.     Capillary Refill: Capillary refill takes less than 2 seconds.  Neurological:     General: No focal deficit present.   Laboratory examination:    Recent Labs    02/11/21 0050 02/20/21 1620 02/21/21 0425  NA 141 143 142  K 3.7 3.5 3.3*  CL 111 108 106  CO2 $Re'23 23 27  'vrj$ GLUCOSE 118* 76 86  BUN 5* 11 10  CREATININE 0.59 0.80 0.84  CALCIUM 8.1* 8.4* 7.9*  GFRNONAA >60 >60 >60   estimated creatinine clearance is 52 mL/min (by C-G formula based on SCr of 0.84 mg/dL).  CMP Latest Ref Rng & Units 02/21/2021 02/20/2021 02/11/2021  Glucose 70 - 99 mg/dL 86 76 118(H)  BUN 8 - 23 mg/dL 10 11 5(L)  Creatinine 0.44 - 1.00 mg/dL 0.84 0.80 0.59  Sodium 135 - 145 mmol/L 142 143 141  Potassium 3.5 - 5.1 mmol/L 3.3(L) 3.5 3.7  Chloride 98 - 111 mmol/L 106 108 111  CO2 22 - 32 mmol/L $RemoveB'27 23 23  'dgfiUgaI$ Calcium 8.9 - 10.3 mg/dL 7.9(L) 8.4(L) 8.1(L)  Total Protein 6.5 - 8.1 g/dL 6.7 7.3 -  Total Bilirubin 0.3 - 1.2 mg/dL 1.0 1.3(H) -  Alkaline Phos 38 - 126 U/L 59 59 -  AST 15 - 41 U/L 43(H) 33 -  ALT 0 - 44 U/L 28 24 -   CBC Latest Ref Rng & Units 02/21/2021 02/20/2021 02/11/2021  WBC 4.0 - 10.5 K/uL 7.5 8.8 10.6(H)  Hemoglobin 12.0 - 15.0 g/dL 7.8(L) 8.1(L) 8.0(L)  Hematocrit 36.0 - 46.0 % 24.5(L) 25.2(L) 24.9(L)  Platelets 150 - 400 K/uL 261 313  347     Lipid Panel No results for input(s): CHOL, TRIG, LDLCALC, VLDL, HDL, CHOLHDL, LDLDIRECT in the last 8760 hours.  HEMOGLOBIN A1C Lab Results  Component Value Date   HGBA1C 6.4 (H) 02/06/2021   MPG 136.98 02/06/2021   TSH No results for input(s): TSH in the last 8760 hours. BNP (last 3 results) Recent Labs    02/20/21 1620  BNP 1,255.2*    Cardiac Panel (last  3 results) Recent Labs    02/20/21 1713 02/20/21 2013 02/20/21 2315  CKTOTAL  --   --  80  TROPONINIHS 498* 527* 823*     Medications and allergies  No Known Allergies   Current Meds  Medication Sig   acetaminophen-codeine (TYLENOL #3) 300-30 MG tablet Take 1 tablet by mouth every 4 (four) hours as needed for moderate pain.   cefTRIAXone (ROCEPHIN) IVPB Inject 2 g into the vein daily. Indication:  Septic Arthritis First Dose: Yes Last Day of Therapy:  03/25/21 Labs - Once weekly:  CBC/D and BMP, Labs - Every other week:  ESR and CRP Method of administration: IV Push Method of administration may be changed at the discretion of home infusion pharmacist based upon assessment of the patient and/or caregiver's ability to self-administer the medication ordered.   daptomycin (CUBICIN) IVPB Inject 500 mg into the vein daily. Indication:  Septic Arthritis First Dose: Yes Last Day of Therapy:  03/25/21 Labs - Once weekly:  CBC/D, BMP, and CPK Labs - Every other week:  ESR and CRP Method of administration: IV Push Method of administration may be changed at the discretion of home infusion pharmacist based upon assessment of the patient and/or caregiver's ability to self-administer the medication ordered.   HYDROcodone-acetaminophen (NORCO/VICODIN) 5-325 MG tablet Take 1 tablet by mouth every 6 (six) hours as needed for moderate pain.   losartan (COZAAR) 100 MG tablet Take 100 mg by mouth daily.   meloxicam (MOBIC) 7.5 MG tablet Take 7.5 mg by mouth daily.   metFORMIN (GLUCOPHAGE-XR) 750 MG 24 hr tablet Take 750 mg by  mouth every evening.   traMADol (ULTRAM) 50 MG tablet Take 50 mg by mouth every 6 (six) hours as needed for moderate pain.    Scheduled Meds:  furosemide  40 mg Intravenous Q12H   insulin aspart  0-9 Units Subcutaneous TID WC   losartan  100 mg Oral Daily   potassium chloride  40 mEq Oral Once   sodium chloride flush  3 mL Intravenous Q12H   Continuous Infusions:  cefTRIAXone (ROCEPHIN)  IV Stopped (02/21/21 0104)   DAPTOmycin (CUBICIN)  IV Stopped (02/21/21 0142)   heparin 950 Units/hr (02/21/21 0624)   PRN Meds:.acetaminophen **OR** acetaminophen, polyethylene glycol   No intake/output data recorded. No intake/output data recorded.    Radiology:   DG Chest 2 View  Result Date: 02/20/2021 CLINICAL DATA:  Shortness of breath EXAM: CHEST - 2 VIEW COMPARISON:  01/13/2016 FINDINGS: Low volume examination with diffuse bilateral interstitial opacity and small pleural effusions. Left upper extremity PICC. The heart and mediastinum are normal. Disc degenerative disease of the thoracic spine. IMPRESSION: Low volume examination with diffuse bilateral interstitial opacity and small pleural effusions, consistent with edema or infection. Electronically Signed   By: Delanna Ahmadi M.D.   On: 02/20/2021 08:48   CT Angio Chest PE W/Cm &/Or Wo Cm  Result Date: 02/20/2021 CLINICAL DATA:  PE suspected, high prob EXAM: CT ANGIOGRAPHY CHEST WITH CONTRAST TECHNIQUE: Multidetector CT imaging of the chest was performed using the standard protocol during bolus administration of intravenous contrast. Multiplanar CT image reconstructions and MIPs were obtained to evaluate the vascular anatomy. CONTRAST:  61mL OMNIPAQUE IOHEXOL 350 MG/ML SOLN COMPARISON:  04/14/2011 FINDINGS: Cardiovascular: There is adequate opacification of the central pulmonary arteries. No intraluminal filling defect identified to suggest acute pulmonary embolism. The central pulmonary arteries are of normal caliber. No significant coronary  artery calcification. Global cardiac size is mildly enlarged. No pericardial effusion.  The thoracic aorta is unremarkable. Left subclavian central venous catheter tip is seen within the superior vena cava. Mediastinum/Nodes: No pathologic thoracic adenopathy. Visualized thyroid is unremarkable. Esophagus is unremarkable. Lungs/Pleura: There is moderate bilateral perihilar and suprahilar ground-glass pulmonary infiltrate in a "bat wing" configuration most suggestive of alveolar pulmonary edema. Moderate bilateral pleural effusions are present with compressive atelectasis of the lower lobes bilaterally. No pneumothorax. No central obstructing lesion. Upper Abdomen: No acute abnormality. Musculoskeletal: The osseous structures are age-appropriate. No acute bone abnormality. Review of the MIP images confirms the above findings. IMPRESSION: No pulmonary embolism. Bilateral perihilar and suprahilar pulmonary infiltrates and moderate bilateral pleural effusions most in keeping with moderate cardiogenic failure. Mild cardiomegaly. Electronically Signed   By: Fidela Salisbury M.D.   On: 02/20/2021 19:37   DG Hand Complete Right  Result Date: 02/20/2021 CLINICAL DATA:  Postoperative fever after debridement of the right hand cellulitis. EXAM: RIGHT HAND - COMPLETE 3+ VIEW COMPARISON:  Right wrist x-ray 02/03/2021. MRI right hand 02/06/2021. FINDINGS: There is no acute fracture or dislocation identified. There is stable severe degenerative changes of the radiocarpal joint with joint space narrowing, subchondral sclerosis and cystic formation. There is diffuse marked soft tissue swelling of the hand and wrist. There is no radiopaque foreign body. There are no cortical erosions. IMPRESSION: 1. Marked diffuse soft tissue swelling of the hand and wrist. 2. No acute bony abnormality. Electronically Signed   By: Ronney Asters M.D.   On: 02/20/2021 16:57    X-ray of the hand, right: 1. Marked diffuse soft tissue swelling of the  hand and wrist. 2. No acute bony abnormality.  CT angiogram chest 02/20/2021: IMPRESSION: No pulmonary embolism. Bilateral perihilar and suprahilar pulmonary infiltrates and moderate bilateral pleural effusions most in keeping with moderate cardiogenic failure. Mild cardiomegaly.  Chest x-ray PA and lateral view 02/20/2021: Low volume examination with diffuse bilateral interstitial opacity and small pleural effusions. Left upper extremity PICC. The heart and mediastinum are normal. Disc degenerative disease of the thoracic spine. IMPRESSION: Low volume examination with diffuse bilateral interstitial opacity and small pleural effusions, consistent with edema or infection.   Cardiac Studies:   Echocardiogram 12/11/2017: Left ventricle cavity is normal in size. Visual EF is 60-65%. Mild intraventricular pressure gradient suspected due to dynamic LV systolic function. Doppler evidence of grade I (impaired) diastolic dysfunction, elevated LAP. Left ventricle regional wall motion findings: No wall motion abnormalities. Trace tricuspid regurgitation. Trace pulmonic regurgitation.  EKG:  EKG 02/20/2021: Normal sinus rhythm at rate of 70 bpm, baseline artifact, nonspecific scratch that prolonged QT.  No ST-T wave changes of ischemia.  Assessment   Ezra Marquess is a 70 y.o. African-American female patient with hypertension, type 2 diabetes mellitus, hyperlipidemia, presented to the hospital yesterday with 2 to 5 days of worsening shortness of breath.  Recently admitted on 02/06/2021 with a cellulitis involving right wrist and septic joint, underwent debridement, IV antibiotic therapy.  1.  Acute pulmonary edema 2.  NSTEMI 3.  Diabetes mellitus 4.  Right wrist septic arthritis 5.  Severe anemia, normocytic normochromic  Recommendations:   Patient is in acute decompensated heart failure.  However she is also having temperature of 101.3 with chills, has septic right hand which is still swollen with  abnormal x-ray.  She also has NSTEMI and diabetes mellitus, hypertension and hyperlipidemia along with her age being the risk factor, she could have multivessel coronary disease or it could be significant demand ischemia as the troponins are moderately elevated.  She  also has severe underlying anemia, probable etiology being sepsis.  Do not know what her baseline hemoglobin is.  Continue IV heparin for now, aspirin for now, and also had low-dose beta-blocker therapy for now.  Would certainly obtain ID consultation for that patient can be instrumented with regard to cardiac catheterization.  Otherwise conservative management for now, could consider coronary CT angiogram when stable.  We will continue to follow alongside with you.  She also needs anemia work-up.  Discussed with primary team.  This is a complex consultation with multiple medical systems involved, 90-minute encounter.   Adrian Prows, MD, [facility] 02/21/2021, 8:28 AM Office: [phone]

## 2021-02-21 NOTE — Progress Notes (Addendum)
Progress Note    Christy Higgins  [id number] DOB: Oct 15, 1950  DOA: 02/20/2021 PCP: Shanon Rosser, PA-C    Brief Narrative:     Medical records reviewed and are as summarized below:  Christy Higgins is an 70 y.o. female with medical history significant of cellulitis, diabetes, hypertension, hyperlipidemia presenting for ongoing shortness of breath and fever.    Assessment/Plan:   Principal Problem:   Acute CHF (congestive heart failure) (HCC) Active Problems:   Septic arthritis (HCC)   Demand ischemia (HCC)   HTN (hypertension)   DM (diabetes mellitus) ([place])   Acute respiratory failure with hypoxia (HCC)   CHF (congestive heart failure) ([place])   Acute CHF NSTEMI Acute Respiratory Failure with hypoxia > Patient presenting with worsening shortness of breath and lower extremity edema.  More so lying down flat.  Found to be hypoxic to 86% on room air in the ED now saturating well on 2 L.  Intermittently tachypneic. > CT PE study in the ED was negative for PE did show evidence of edema and effusions consistent with heart failure picture. - Appreciate cardiology recommendations - Continue with heparin drip - Trend troponin - Lasix 40 mg IV twice daily - Strict I's/O, daily weights - Trend renal function and electrolytes   Cellulitis/Septic joint > Recently admitted from 10/19 until 10/26 due to cellulitis and septic joint treated with debridement and IV antibiotics, sent home with PICC line.   - prescribed 6-week course of ceftriaxone and daptomycin. - report having a fever of 101.   - X-ray in the ED with only soft tissue swelling. -blood culture NGTD - Continue with previously prescribed ceftriaxone and daptomycin therapy. -ID Consult -CRP ordered   Diabetes - SSI   Hypertension - Continue home losartan  Hypokalemia/hypomagnesemia -replete  Anemia -unclear etiology -2018 was last normal -heme stool pending -B12 >1000 -Fe mildly low but TIBC also  low  Family Communication/Anticipated D/C date and plan/Code Status   DVT prophylaxis: Lovenox ordered. Code Status: Full Code.  Disposition Plan: Status is: Inpatient  Remains inpatient appropriate because: needs further cardiac work up         Medical Consultants:   ID cards    Subjective:   No increased pain in wrist Says when home health came she had a "fever" that was measures with a thermometer  No calf swelling or cramping  Objective:    Vitals:   02/21/21 0600 02/21/21 0700 02/21/21 0900 02/21/21 1000  BP: (!) 145/88 (!) 150/71 129/63 (!) 146/96  Pulse: 84 (!) 55 64 (!) 58  Resp: 16 (!) 34 18 19  Temp:      TempSrc:      SpO2: 93% 95% 98% 90%  Weight:      Height:       No intake or output data in the 24 hours ending 02/21/21 1236 Filed Weights   02/20/21 1952  Weight: 62.1 kg    Exam:  General: Appearance:     Overweight female in no acute distress     Lungs:     respirations unlabored  Heart:    Bradycardic. Normal rhythm. No murmurs, rubs, or gallops.   MS:   All extremities are intact. Right wrist in brace (due to have off stitched next Tuesday)   Neurologic:   Awake, alert, oriented x 3. No apparent focal neurological           defect.      Data Reviewed:   I have personally  reviewed following labs and imaging studies:  Labs: Labs show the following:   Basic Metabolic Panel: Recent Labs  Lab 02/20/21 1620 02/20/21 2315 02/21/21 0425  NA 143  --  142  K 3.5  --  3.3*  CL 108  --  106  CO2 23  --  27  GLUCOSE 76  --  86  BUN 11  --  10  CREATININE 0.80  --  0.84  CALCIUM 8.4*  --  7.9*  MG  --  1.2*  --    GFR Estimated Creatinine Clearance: 52 mL/min (by C-G formula based on SCr of 0.84 mg/dL). Liver Function Tests: Recent Labs  Lab 02/20/21 1620 02/21/21 0425  AST 33 43*  ALT 24 28  ALKPHOS 59 59  BILITOT 1.3* 1.0  PROT 7.3 6.7  ALBUMIN 2.6* 2.4*   No results for input(s): LIPASE, AMYLASE in the last 168  hours. No results for input(s): AMMONIA in the last 168 hours. Coagulation profile Recent Labs  Lab 02/20/21 1620  INR 1.4*    CBC: Recent Labs  Lab 02/20/21 1620 02/21/21 0425  WBC 8.8 7.5  NEUTROABS 6.6  --   HGB 8.1* 7.8*  HCT 25.2* 24.5*  MCV 86.0 85.4  PLT 313 261   Cardiac Enzymes: Recent Labs  Lab 02/20/21 2315  CKTOTAL 80   BNP (last 3 results) No results for input(s): PROBNP in the last 8760 hours. CBG: Recent Labs  Lab 02/21/21 0026 02/21/21 0755  GLUCAP 95 87   D-Dimer: No results for input(s): DDIMER in the last 72 hours. Hgb A1c: No results for input(s): HGBA1C in the last 72 hours. Lipid Profile: Recent Labs    02/21/21 0937  CHOL 145  HDL 45  LDLCALC 86  TRIG 71  CHOLHDL 3.2   Thyroid function studies: No results for input(s): TSH, T4TOTAL, T3FREE, THYROIDAB in the last 72 hours.  Invalid input(s): FREET3 Anemia work up: No results for input(s): VITAMINB12, FOLATE, FERRITIN, TIBC, IRON, RETICCTPCT in the last 72 hours. Sepsis Labs: Recent Labs  Lab 02/20/21 1620 02/20/21 1713 02/21/21 0425  WBC 8.8  --  7.5  LATICACIDVEN 1.1 1.3  --     Microbiology Recent Results (from the past 240 hour(s))  Blood Culture (routine x 2)     Status: None (Preliminary result)   Collection Time: 02/20/21  4:20 PM   Specimen: BLOOD  Result Value Ref Range Status   Specimen Description BLOOD PICC LINE  Final   Special Requests   Final    BOTTLES DRAWN AEROBIC AND ANAEROBIC Blood Culture adequate volume   Culture   Final    NO GROWTH < 12 HOURS Performed at [facility], [address] [place], [place] [address]    Report Status PENDING  Incomplete  Blood Culture (routine x 2)     Status: None (Preliminary result)   Collection Time: 02/20/21  5:13 PM   Specimen: BLOOD  Result Value Ref Range Status   Specimen Description BLOOD SITE NOT SPECIFIED  Final   Special Requests   Final    BOTTLES DRAWN AEROBIC AND ANAEROBIC Blood Culture  adequate volume   Culture   Final    NO GROWTH < 24 HOURS Performed at [facility], [address], [place], [place] [address]    Report Status PENDING  Incomplete  Resp Panel by RT-PCR (Flu A&B, Covid) Nasopharyngeal Swab     Status: None   Collection Time: 02/20/21  8:55  PM   Specimen: Nasopharyngeal Swab; Nasopharyngeal(NP) swabs in vial transport medium  Result Value Ref Range Status   SARS Coronavirus 2 by RT PCR NEGATIVE NEGATIVE Final    Comment: (NOTE) SARS-CoV-2 target nucleic acids are NOT DETECTED.  The SARS-CoV-2 RNA is generally detectable in upper respiratory specimens during the acute phase of infection. The lowest concentration of SARS-CoV-2 viral copies this assay can detect is 138 copies/mL. A negative result does not preclude SARS-Cov-2 infection and should not be used as the sole basis for treatment or other patient management decisions. A negative result may occur with  improper specimen collection/handling, submission of specimen other than nasopharyngeal swab, presence of viral mutation(s) within the areas targeted by this assay, and inadequate number of viral copies(<138 copies/mL). A negative result must be combined with clinical observations, patient history, and epidemiological information. The expected result is Negative.  Fact Sheet for Patients:  EntrepreneurPulse.com.au  Fact Sheet for Healthcare Providers:  IncredibleEmployment.be  This test is no t yet approved or cleared by the [place] FDA and  has been authorized for detection and/or diagnosis of SARS-CoV-2 by FDA under an Emergency Use Authorization (EUA). This EUA will remain  in effect (meaning this test can be used) for the duration of the COVID-19 declaration under Section 564(b)(1) of the Act, 21 U.S.C.section 360bbb-3(b)(1), unless the authorization is terminated  or revoked sooner.       Influenza A by PCR NEGATIVE NEGATIVE Final    Influenza B by PCR NEGATIVE NEGATIVE Final    Comment: (NOTE) The Xpert Xpress SARS-CoV-2/FLU/RSV plus assay is intended as an aid in the diagnosis of influenza from Nasopharyngeal swab specimens and should not be used as a sole basis for treatment. Nasal washings and aspirates are unacceptable for Xpert Xpress SARS-CoV-2/FLU/RSV testing.  Fact Sheet for Patients: EntrepreneurPulse.com.au  Fact Sheet for Healthcare Providers: IncredibleEmployment.be  This test is not yet approved or cleared by the [place] FDA and has been authorized for detection and/or diagnosis of SARS-CoV-2 by FDA under an Emergency Use Authorization (EUA). This EUA will remain in effect (meaning this test can be used) for the duration of the COVID-19 declaration under Section 564(b)(1) of the Act, 21 U.S.C. section 360bbb-3(b)(1), unless the authorization is terminated or revoked.  Performed at [facility], [place] [address], [place], [place] [address]     Procedures and diagnostic studies:  DG Chest 2 View  Result Date: 02/20/2021 CLINICAL DATA:  Shortness of breath EXAM: CHEST - 2 VIEW COMPARISON:  01/13/2016 FINDINGS: Low volume examination with diffuse bilateral interstitial opacity and small pleural effusions. Left upper extremity PICC. The heart and mediastinum are normal. Disc degenerative disease of the thoracic spine. IMPRESSION: Low volume examination with diffuse bilateral interstitial opacity and small pleural effusions, consistent with edema or infection. Electronically Signed   By: Delanna Ahmadi M.D.   On: 02/20/2021 08:48   CT Angio Chest PE W/Cm &/Or Wo Cm  Result Date: 02/20/2021 CLINICAL DATA:  PE suspected, high prob EXAM: CT ANGIOGRAPHY CHEST WITH CONTRAST TECHNIQUE: Multidetector CT imaging of the chest was performed using the standard protocol during bolus administration of intravenous contrast. Multiplanar CT image reconstructions and MIPs were  obtained to evaluate the vascular anatomy. CONTRAST:  82mL OMNIPAQUE IOHEXOL 350 MG/ML SOLN COMPARISON:  04/14/2011 FINDINGS: Cardiovascular: There is adequate opacification of the central pulmonary arteries. No intraluminal filling defect identified to suggest acute pulmonary embolism. The central pulmonary arteries are of normal caliber. No significant coronary artery calcification.  Global cardiac size is mildly enlarged. No pericardial effusion. The thoracic aorta is unremarkable. Left subclavian central venous catheter tip is seen within the superior vena cava. Mediastinum/Nodes: No pathologic thoracic adenopathy. Visualized thyroid is unremarkable. Esophagus is unremarkable. Lungs/Pleura: There is moderate bilateral perihilar and suprahilar ground-glass pulmonary infiltrate in a "bat wing" configuration most suggestive of alveolar pulmonary edema. Moderate bilateral pleural effusions are present with compressive atelectasis of the lower lobes bilaterally. No pneumothorax. No central obstructing lesion. Upper Abdomen: No acute abnormality. Musculoskeletal: The osseous structures are age-appropriate. No acute bone abnormality. Review of the MIP images confirms the above findings. IMPRESSION: No pulmonary embolism. Bilateral perihilar and suprahilar pulmonary infiltrates and moderate bilateral pleural effusions most in keeping with moderate cardiogenic failure. Mild cardiomegaly. Electronically Signed   By: Fidela Salisbury M.D.   On: 02/20/2021 19:37   DG Hand Complete Right  Result Date: 02/20/2021 CLINICAL DATA:  Postoperative fever after debridement of the right hand cellulitis. EXAM: RIGHT HAND - COMPLETE 3+ VIEW COMPARISON:  Right wrist x-ray 02/03/2021. MRI right hand 02/06/2021. FINDINGS: There is no acute fracture or dislocation identified. There is stable severe degenerative changes of the radiocarpal joint with joint space narrowing, subchondral sclerosis and cystic formation. There is diffuse marked  soft tissue swelling of the hand and wrist. There is no radiopaque foreign body. There are no cortical erosions. IMPRESSION: 1. Marked diffuse soft tissue swelling of the hand and wrist. 2. No acute bony abnormality. Electronically Signed   By: Ronney Asters M.D.   On: 02/20/2021 16:57    Medications:    atorvastatin  80 mg Oral Daily   furosemide  40 mg Intravenous Q12H   insulin aspart  0-9 Units Subcutaneous TID WC   losartan  100 mg Oral Daily   metoprolol tartrate  25 mg Oral BID   potassium chloride  20 mEq Oral BID   sodium chloride flush  3 mL Intravenous Q12H   Continuous Infusions:  cefTRIAXone (ROCEPHIN)  IV Stopped (02/21/21 0104)   DAPTOmycin (CUBICIN)  IV Stopped (02/21/21 0142)   heparin 950 Units/hr (02/21/21 0624)   magnesium sulfate bolus IVPB       LOS: 0 days   Geradine Girt  [facility]   How to contact the [facility] Attending or Consulting provider [place] or covering provider during after hours [place], for this patient?  Check the care team in [facility] and look for a) attending/consulting TRH provider listed and b) the [facility] team listed Log into [email or web address] and use [place]'s universal password to access. If you do not have the password, please contact the hospital operator. Locate the [facility] provider you are looking for under [facility] and page to a number that you can be directly reached. If you still have difficulty reaching the provider, please page the [facility] (Director on Call) for the Hospitalists listed on amion for assistance.  02/21/2021, 12:36 PM

## 2021-02-21 NOTE — Plan of Care (Signed)
°  Problem: Clinical Measurements: °Goal: Will remain free from infection °Outcome: Progressing °Goal: Respiratory complications will improve °Outcome: Progressing °  °

## 2021-02-22 ENCOUNTER — Inpatient Hospital Stay (HOSPITAL_COMMUNITY): Payer: BC Managed Care – PPO

## 2021-02-22 ENCOUNTER — Encounter (HOSPITAL_COMMUNITY)
Admission: EM | Disposition: A | Discharge status: Home Health | Payer: Self-pay | Source: Home / Self Care | Attending: Internal Medicine

## 2021-02-22 DIAGNOSIS — I509 Heart failure, unspecified: Secondary | ICD-10-CM | POA: Diagnosis not present

## 2021-02-22 DIAGNOSIS — R509 Fever, unspecified: Secondary | ICD-10-CM | POA: Diagnosis not present

## 2021-02-22 DIAGNOSIS — E119 Type 2 diabetes mellitus without complications: Secondary | ICD-10-CM | POA: Diagnosis not present

## 2021-02-22 DIAGNOSIS — I214 Non-ST elevation (NSTEMI) myocardial infarction: Secondary | ICD-10-CM | POA: Diagnosis not present

## 2021-02-22 DIAGNOSIS — I248 Other forms of acute ischemic heart disease: Secondary | ICD-10-CM | POA: Diagnosis not present

## 2021-02-22 DIAGNOSIS — J8281 Chronic eosinophilic pneumonia: Secondary | ICD-10-CM | POA: Diagnosis not present

## 2021-02-22 LAB — ABO/RH: ABO/RH(D): O POS

## 2021-02-22 LAB — BASIC METABOLIC PANEL
Anion gap: 8 (ref 5–15)
BUN: 10 mg/dL (ref 8–23)
CO2: 26 mmol/L (ref 22–32)
Calcium: 8 mg/dL — ABNORMAL LOW (ref 8.9–10.3)
Chloride: 103 mmol/L (ref 98–111)
Creatinine, Ser: 0.81 mg/dL (ref 0.44–1.00)
GFR, Estimated: >60 mL/min (ref >60)
Glucose, Bld: 86 mg/dL (ref 70–99)
Potassium: 3.8 mmol/L (ref 3.5–5.1)
Sodium: 137 mmol/L (ref 135–145)

## 2021-02-22 LAB — CBC
HCT: 23.2 % — ABNORMAL LOW (ref 36.0–46.0)
HCT: 15.4 % — ABNORMAL LOW (ref 36.0–46.0)
HCT: 15 % — ABNORMAL LOW (ref 36.0–46.0)
HCT: 21 % — ABNORMAL LOW (ref 36.0–46.0)
HCT: 26.7 % — ABNORMAL LOW (ref 36.0–46.0)
Hemoglobin: 7.4 g/dL — ABNORMAL LOW (ref 12.0–15.0)
Hemoglobin: 4.9 g/dL — CL (ref 12.0–15.0)
Hemoglobin: 4.7 g/dL — CL (ref 12.0–15.0)
Hemoglobin: 6.5 g/dL — CL (ref 12.0–15.0)
Hemoglobin: 8.9 g/dL — ABNORMAL LOW (ref 12.0–15.0)
MCH: 26.7 pg (ref 26.0–34.0)
MCH: 27.4 pg (ref 26.0–34.0)
MCH: 27.2 pg (ref 26.0–34.0)
MCH: 26.5 pg (ref 26.0–34.0)
MCH: 28.5 pg (ref 26.0–34.0)
MCHC: 31.9 g/dL (ref 30.0–36.0)
MCHC: 31.8 g/dL (ref 30.0–36.0)
MCHC: 31.3 g/dL (ref 30.0–36.0)
MCHC: 31 g/dL (ref 30.0–36.0)
MCHC: 33.3 g/dL (ref 30.0–36.0)
MCV: 83.8 fL (ref 80.0–100.0)
MCV: 86 fL (ref 80.0–100.0)
MCV: 86.7 fL (ref 80.0–100.0)
MCV: 85.7 fL (ref 80.0–100.0)
MCV: 85.6 fL (ref 80.0–100.0)
Platelets: 242 10*3/uL (ref 150–400)
Platelets: 145 10*3/uL — ABNORMAL LOW (ref 150–400)
Platelets: 144 10*3/uL — ABNORMAL LOW (ref 150–400)
Platelets: 209 10*3/uL (ref 150–400)
Platelets: 206 10*3/uL (ref 150–400)
RBC: 2.77 MIL/uL — ABNORMAL LOW (ref 3.87–5.11)
RBC: 1.79 MIL/uL — ABNORMAL LOW (ref 3.87–5.11)
RBC: 1.73 MIL/uL — ABNORMAL LOW (ref 3.87–5.11)
RBC: 2.45 MIL/uL — ABNORMAL LOW (ref 3.87–5.11)
RBC: 3.12 MIL/uL — ABNORMAL LOW (ref 3.87–5.11)
RDW: 15.2 % (ref 11.5–15.5)
RDW: 15.4 % (ref 11.5–15.5)
RDW: 15.2 % (ref 11.5–15.5)
RDW: 15.1 % (ref 11.5–15.5)
RDW: 15.3 % (ref 11.5–15.5)
WBC: 6.8 10*3/uL (ref 4.0–10.5)
WBC: 4.7 10*3/uL (ref 4.0–10.5)
WBC: 4.5 10*3/uL (ref 4.0–10.5)
WBC: 5.8 10*3/uL (ref 4.0–10.5)
WBC: 5.6 10*3/uL (ref 4.0–10.5)
nRBC: 0 % (ref 0.0–0.2)
nRBC: 0 % (ref 0.0–0.2)
nRBC: 0 % (ref 0.0–0.2)
nRBC: 0 % (ref 0.0–0.2)
nRBC: 0.4 % — ABNORMAL HIGH (ref 0.0–0.2)

## 2021-02-22 LAB — GLUCOSE, CAPILLARY
Glucose-Capillary: 86 mg/dL (ref 70–99)
Glucose-Capillary: 81 mg/dL (ref 70–99)
Glucose-Capillary: 86 mg/dL (ref 70–99)
Glucose-Capillary: 95 mg/dL (ref 70–99)

## 2021-02-22 LAB — DIFFERENTIAL
Abs Immature Granulocytes: 0.03 10*3/uL (ref 0.00–0.07)
Basophils Absolute: 0 10*3/uL (ref 0.0–0.1)
Basophils Relative: 1 %
Eosinophils Absolute: 0 10*3/uL (ref 0.0–0.5)
Eosinophils Relative: 0 %
Immature Granulocytes: 1 %
Lymphocytes Relative: 27 %
Lymphs Abs: 1.6 10*3/uL (ref 0.7–4.0)
Monocytes Absolute: 0.8 10*3/uL (ref 0.1–1.0)
Monocytes Relative: 14 %
Neutro Abs: 3.4 10*3/uL (ref 1.7–7.7)
Neutrophils Relative %: 57 %

## 2021-02-22 LAB — PREPARE RBC (CROSSMATCH)

## 2021-02-22 LAB — LACTATE DEHYDROGENASE: LDH: 208 U/L — ABNORMAL HIGH (ref 98–192)

## 2021-02-22 LAB — SAVE SMEAR(SSMR), FOR PROVIDER SLIDE REVIEW

## 2021-02-22 LAB — HEPARIN LEVEL (UNFRACTIONATED): Heparin Unfractionated: 0.52 IU/mL (ref 0.30–0.70)

## 2021-02-22 SURGERY — LEFT HEART CATH AND CORONARY ANGIOGRAPHY
Anesthesia: LOCAL

## 2021-02-22 MED ORDER — SODIUM CHLORIDE 0.9 % IV SOLN: INTRAVENOUS | Status: DC

## 2021-02-22 MED ORDER — SODIUM CHLORIDE 0.9% IV SOLUTION: Freq: Once | INTRAVENOUS | Status: AC

## 2021-02-22 MED ORDER — PANTOPRAZOLE SODIUM 40 MG PO TBEC
40.0000 mg | DELAYED_RELEASE_TABLET | Freq: Every day | ORAL | Status: DC
  Administered 2021-02-22 – 2021-02-26 (×5): 40 mg via ORAL
  Filled 2021-02-22 (×5): qty 1

## 2021-02-22 MED ORDER — CHLORHEXIDINE GLUCONATE CLOTH 2 % EX PADS
6.0000 | MEDICATED_PAD | Freq: Every day | CUTANEOUS | Status: DC
  Administered 2021-02-22 – 2021-02-28 (×6): 6 via TOPICAL

## 2021-02-22 MED ORDER — SODIUM CHLORIDE 0.9 % IV SOLN: 250.0000 mL | INTRAVENOUS | Status: DC | PRN

## 2021-02-22 MED ORDER — ASPIRIN 81 MG PO CHEW
81.0000 mg | CHEWABLE_TABLET | ORAL | Status: AC
  Administered 2021-02-22: 81 mg via ORAL
  Filled 2021-02-22: qty 1

## 2021-02-22 MED ORDER — INSULIN ASPART 100 UNIT/ML IJ SOLN
0.0000 [IU] | Freq: Three times a day (TID) | INTRAMUSCULAR | Status: DC
Start: 2021-02-22 — End: 2021-02-28

## 2021-02-22 MED ORDER — NYSTATIN 100000 UNIT/ML MT SUSP
5.0000 mL | Freq: Four times a day (QID) | OROMUCOSAL | Status: DC
  Administered 2021-02-22 – 2021-02-28 (×24): 500000 [IU] via ORAL
  Filled 2021-02-22 (×25): qty 5

## 2021-02-22 MED ORDER — SODIUM CHLORIDE 0.9% FLUSH: 3.0000 mL | INTRAVENOUS | Status: DC | PRN

## 2021-02-22 NOTE — Progress Notes (Signed)
Pt. Hgb 4.9. On call for [facility] paged to make aware.

## 2021-02-22 NOTE — Progress Notes (Signed)
[facility]  Date of Admission:  02/20/2021      Total days of antibiotics 1   Vancomycin 11/03 >> current   Ceftriaxone 10/25 >> current  Daptomycin 10/25 >> 11/03          ASSESSMENT: Christy Higgins is a 70 y.o. female readmitted to the hospital with shortness of breath, cough and fever. She was discharged 10/26 after undergoing serial I&Ds on Rt wrist with negative cultures on IV Daptomycin and Ceftriaxone via LUE PICC.   Fevers - it is not clear what is causing her fevers. If dapto eosinophilic pna could persist from that (though she does not have a peripheral eosinophilia). Will add on differential today to reassess for eos.  Does not seem like a PICC Line infection with negative blood cultures now. Will check MRI of hand for reassurance given unexplained fevers.   Severe Anemia - Hgb down to 4.7 without any obvious source of bleeding. FOBT pending. If no source ?hemolytic reaction to drug (ceftriaxone). Will check hemolysis labs in AM. Tbili was a little elevated @ 1.3 recently.     PLAN: Doppler upper and lower extremities  Add differential  LDH in AM Will check MRI of Rt hand.     Principal Problem:   Acute CHF (congestive heart failure) (HCC) Active Problems:   Septic arthritis (HCC)   Demand ischemia (HCC)   HTN (hypertension)   DM (diabetes mellitus) (HCC)   Acute respiratory failure with hypoxia (HCC)   CHF (congestive heart failure) (HCC)   NSTEMI (non-ST elevated myocardial infarction) (HCC)   Hand abscess   Fever   Eosinophilic pneumonia (HCC)    atorvastatin  80 mg Oral Daily   Chlorhexidine Gluconate Cloth  6 each Topical Daily   furosemide  40 mg Intravenous Q12H   insulin aspart  0-9 Units Subcutaneous TID WC   losartan  100 mg Oral Daily   metoprolol tartrate  25 mg Oral BID   nystatin  5 mL Oral QID   potassium chloride  20 mEq Oral BID   sodium chloride flush  3 mL Intravenous Q12H   sodium chloride flush   3 mL Intravenous Q12H    SUBJECTIVE: She says she feels better today. Off oxygen. Walking up and down to the bathroom without any dizziness, DOE, palpitations or other symptoms. Denies any bleeding or changes to stool - specifically denies any brown/black/green stool. If anything some brown diarrhea since starting antibiotics.   High fever to 103 overnight. Responded to tylenol/ASA.  Otherwise no new concerns.   Review of Systems: Review of Systems  Constitutional:  Positive for fever. Negative for chills.  HENT:  Negative for tinnitus.   Eyes:  Negative for blurred vision and photophobia.  Respiratory:  Negative for cough and sputum production.   Cardiovascular:  Negative for chest pain.  Gastrointestinal:  Negative for abdominal pain, blood in stool, diarrhea, melena, nausea and vomiting.  Genitourinary:  Negative for dysuria.  Skin:  Negative for rash.  Neurological:  Negative for dizziness, weakness and headaches.   No Known Allergies  OBJECTIVE: Vitals:   02/22/21 0652 02/22/21 0738 02/22/21 1045 02/22/21 1119  BP:  124/75 (!) 119/59 134/62  Pulse:  65 62 60  Resp:    18  Temp: (!) 101.8 F (38.8 C) (!) 100.6 F (38.1 C) 99.6 F (37.6 C) 99.1 F (37.3 C)  TempSrc: Oral Oral Oral   SpO2:  93% 93%  Weight:      Height:       Body mass index is 23.42 kg/m.  Physical Exam Vitals reviewed.  Constitutional:      Appearance: She is well-developed. She is not ill-appearing.     Comments: Sitting up in bed. Appears comfortable.   Cardiovascular:     Rate and Rhythm: Normal rate and regular rhythm.  Pulmonary:     Effort: Pulmonary effort is normal.     Breath sounds: Normal breath sounds.     Comments: Off oxygen and breathing normally.  Skin:    General: Skin is warm and dry.     Findings: No erythema or rash.     Comments: LUE picc line c/d/I and non-tender  Neurological:     Mental Status: She is alert and oriented to person, place, and time.    Lab  Results Lab Results  Component Value Date   WBC 5.8 02/22/2021   HGB 6.5 (LL) 02/22/2021   HCT 21.0 (L) 02/22/2021   MCV 85.7 02/22/2021   PLT 209 02/22/2021    Lab Results  Component Value Date   CREATININE 0.81 02/22/2021   BUN 10 02/22/2021   NA 137 02/22/2021   K 3.8 02/22/2021   CL 103 02/22/2021   CO2 26 02/22/2021    Lab Results  Component Value Date   ALT 28 02/21/2021   AST 43 (H) 02/21/2021   ALKPHOS 59 02/21/2021   BILITOT 1.0 02/21/2021     Microbiology: Recent Results (from the past 240 hour(s))  Blood Culture (routine x 2)     Status: None (Preliminary result)   Collection Time: 02/20/21  4:20 PM   Specimen: BLOOD  Result Value Ref Range Status   Specimen Description BLOOD PICC LINE  Final   Special Requests   Final    BOTTLES DRAWN AEROBIC AND ANAEROBIC Blood Culture adequate volume   Culture   Final    NO GROWTH 2 DAYS Performed at [facility], [address] [place], [place] [address]    Report Status PENDING  Incomplete  Blood Culture (routine x 2)     Status: None (Preliminary result)   Collection Time: 02/20/21  5:13 PM   Specimen: BLOOD  Result Value Ref Range Status   Specimen Description BLOOD SITE NOT SPECIFIED  Final   Special Requests   Final    BOTTLES DRAWN AEROBIC AND ANAEROBIC Blood Culture adequate volume   Culture   Final    NO GROWTH 2 DAYS Performed at [facility], [address] [place], [place] [address]    Report Status PENDING  Incomplete  Resp Panel by RT-PCR (Flu A&B, Covid) Nasopharyngeal Swab     Status: None   Collection Time: 02/20/21  8:55 PM   Specimen: Nasopharyngeal Swab; Nasopharyngeal(NP) swabs in vial transport medium  Result Value Ref Range Status   SARS Coronavirus 2 by RT PCR NEGATIVE NEGATIVE Final    Comment: (NOTE) SARS-CoV-2 target nucleic acids are NOT DETECTED.  The SARS-CoV-2 RNA is generally detectable in upper respiratory specimens during the acute phase of infection. The  lowest concentration of SARS-CoV-2 viral copies this assay can detect is 138 copies/mL. A negative result does not preclude SARS-Cov-2 infection and should not be used as the sole basis for treatment or other patient management decisions. A negative result may occur with  improper specimen collection/handling, submission of specimen other than nasopharyngeal swab, presence of viral mutation(s) within the areas targeted by this  assay, and inadequate number of viral copies(<138 copies/mL). A negative result must be combined with clinical observations, patient history, and epidemiological information. The expected result is Negative.  Fact Sheet for Patients:  BloggerCourse.com  Fact Sheet for Healthcare Providers:  SeriousBroker.it  This test is no t yet approved or cleared by the [place] FDA and  has been authorized for detection and/or diagnosis of SARS-CoV-2 by FDA under an Emergency Use Authorization (EUA). This EUA will remain  in effect (meaning this test can be used) for the duration of the COVID-19 declaration under Section 564(b)(1) of the Act, 21 U.S.C.section 360bbb-3(b)(1), unless the authorization is terminated  or revoked sooner.       Influenza A by PCR NEGATIVE NEGATIVE Final   Influenza B by PCR NEGATIVE NEGATIVE Final    Comment: (NOTE) The Xpert Xpress SARS-CoV-2/FLU/RSV plus assay is intended as an aid in the diagnosis of influenza from Nasopharyngeal swab specimens and should not be used as a sole basis for treatment. Nasal washings and aspirates are unacceptable for Xpert Xpress SARS-CoV-2/FLU/RSV testing.  Fact Sheet for Patients: BloggerCourse.com  Fact Sheet for Healthcare Providers: SeriousBroker.it  This test is not yet approved or cleared by the [place] FDA and has been authorized for detection and/or diagnosis of SARS-CoV-2 by FDA under  an Emergency Use Authorization (EUA). This EUA will remain in effect (meaning this test can be used) for the duration of the COVID-19 declaration under Section 564(b)(1) of the Act, 21 U.S.C. section 360bbb-3(b)(1), unless the authorization is terminated or revoked.  Performed at [facility], [address], [place], [place] [address]     Rexene Alberts, MSN, NP-C [facility] for Infectious [facility] Pager: [phone]  @TODAY @ 1:02 PM

## 2021-02-22 NOTE — Plan of Care (Signed)
  Problem: Education: Goal: Knowledge of General Education information will improve Description: Including pain rating scale, medication(s)/side effects and non-pharmacologic comfort measures 02/22/2021 1523 by Sonny Masters, RN Outcome: Progressing 02/22/2021 1523 by Sonny Masters, RN Outcome: Progressing   Problem: Health Behavior/Discharge Planning: Goal: Ability to manage health-related needs will improve 02/22/2021 1523 by Sonny Masters, RN Outcome: Progressing 02/22/2021 1523 by Sonny Masters, RN Outcome: Progressing   Problem: Clinical Measurements: Goal: Ability to maintain clinical measurements within normal limits will improve 02/22/2021 1523 by Sonny Masters, RN Outcome: Progressing 02/22/2021 1523 by Sonny Masters, RN Outcome: Progressing Goal: Will remain free from infection 02/22/2021 1523 by Sonny Masters, RN Outcome: Progressing 02/22/2021 1523 by Sonny Masters, RN Outcome: Progressing Goal: Diagnostic test results will improve 02/22/2021 1523 by Sonny Masters, RN Outcome: Progressing 02/22/2021 1523 by Sonny Masters, RN Outcome: Progressing Goal: Respiratory complications will improve 02/22/2021 1523 by Sonny Masters, RN Outcome: Progressing 02/22/2021 1523 by Sonny Masters, RN Outcome: Progressing Goal: Cardiovascular complication will be avoided 02/22/2021 1523 by Sonny Masters, RN Outcome: Progressing 02/22/2021 1523 by Sonny Masters, RN Outcome: Progressing   Problem: Activity: Goal: Risk for activity intolerance will decrease 02/22/2021 1523 by Sonny Masters, RN Outcome: Progressing 02/22/2021 1523 by Sonny Masters, RN Outcome: Progressing   Problem: Nutrition: Goal: Adequate nutrition will be maintained 02/22/2021 1523 by Sonny Masters, RN Outcome: Progressing 02/22/2021 1523 by Sonny Masters, RN Outcome: Progressing   Problem: Coping: Goal: Level of anxiety will decrease 02/22/2021 1523 by Sonny Masters, RN Outcome: Progressing 02/22/2021 1523 by Sonny Masters, RN Outcome: Progressing   Problem: Elimination: Goal: Will not experience complications related to bowel motility 02/22/2021 1523 by Sonny Masters, RN Outcome: Progressing 02/22/2021 1523 by Sonny Masters, RN Outcome: Progressing Goal: Will not experience complications related to urinary retention 02/22/2021 1523 by Sonny Masters, RN Outcome: Progressing 02/22/2021 1523 by Sonny Masters, RN Outcome: Progressing   Problem: Pain Managment: Goal: General experience of comfort will improve 02/22/2021 1523 by Sonny Masters, RN Outcome: Progressing 02/22/2021 1523 by Sonny Masters, RN Outcome: Progressing   Problem: Safety: Goal: Ability to remain free from injury will improve 02/22/2021 1523 by Sonny Masters, RN Outcome: Progressing 02/22/2021 1523 by Sonny Masters, RN Outcome: Progressing   Problem: Skin Integrity: Goal: Risk for impaired skin integrity will decrease 02/22/2021 1523 by Sonny Masters, RN Outcome: Progressing 02/22/2021 1523 by Sonny Masters, RN Outcome: Progressing   Problem: Education: Goal: Ability to demonstrate management of disease process will improve 02/22/2021 1523 by Sonny Masters, RN Outcome: Progressing 02/22/2021 1523 by Sonny Masters, RN Outcome: Progressing Goal: Ability to verbalize understanding of medication therapies will improve 02/22/2021 1523 by Sonny Masters, RN Outcome: Progressing 02/22/2021 1523 by Sonny Masters, RN Outcome: Progressing Goal: Individualized Educational Video(s) 02/22/2021 1523 by Sonny Masters, RN Outcome: Progressing 02/22/2021 1523 by Sonny Masters, RN Outcome: Progressing   Problem: Activity: Goal: Capacity to carry out activities will improve 02/22/2021 1523 by Sonny Masters, RN Outcome: Progressing 02/22/2021 1523 by Sonny Masters, RN Outcome: Progressing   Problem: Cardiac: Goal: Ability to achieve and maintain adequate cardiopulmonary perfusion will improve 02/22/2021 1523 by Sonny Masters, RN Outcome:  Progressing 02/22/2021 1523 by Sonny Masters, RN Outcome: Progressing

## 2021-02-22 NOTE — Progress Notes (Signed)
Echocardiogram 11/032022:    1. Left ventricular ejection fraction, by estimation, is 60 to 65%. The left ventricle has normal function. The left ventricle has no regional wall motion abnormalities. Left ventricular diastolic parameters are consistent with Grade I diastolic  dysfunction (impaired relaxation).  2. Right ventricular systolic function is normal. The right ventricular size is normal. There is normal pulmonary artery systolic pressure. The estimated right ventricular systolic pressure is 27.2 mmHg.  3. The mitral valve is normal in structure. Trivial mitral valve regurgitation. No evidence of mitral stenosis.  4. The aortic valve is tricuspid. Aortic valve regurgitation is not visualized. No aortic stenosis is present.  5. The inferior vena cava is normal in size with greater than 50% respiratory variability, suggesting right atrial pressure of 3 mmHg.  6. Pleural effusion noted.  CBC Latest Ref Rng & Units 02/22/2021 02/21/2021 02/20/2021  WBC 4.0 - 10.5 K/uL 6.8 7.5 8.8  Hemoglobin 12.0 - 15.0 g/dL 7.4(L) 7.8(L) 8.1(L)  Hematocrit 36.0 - 46.0 % 23.2(L) 24.5(L) 25.2(L)  Platelets 150 - 400 K/uL 242 261 313   BMP Latest Ref Rng & Units 02/22/2021 02/21/2021 02/20/2021  Glucose 70 - 99 mg/dL 86 86 76  BUN 8 - 23 mg/dL 10 10 11   Creatinine 0.44 - 1.00 mg/dL 5.10 2.58  Sodium 135 - 145 mmol/L 137 142 143  Potassium 3.5 - 5.1 mmol/L 3.8 3.3(L) 3.5  Chloride 98 - 111 mmol/L 103 106 108  CO2 22 - 32 mmol/L 26 27 23   Calcium 8.9 - 10.3 mg/dL 8.0(L) 7.9(L) 8.4(L)    Cardiac Panel (last 3 results) Recent Labs    02/20/21 2315 02/21/21 0937 02/21/21 1113  CKTOTAL 80  --   --   TROPONINIHS 823* 413* 419*    Patient has been cleared by ID to proceed with cardiac catheterization.  No obvious etiology for severe anemia, however hemoglobin has been drifting over the past 3 months.  We will proceed with cardiac catheterization, stat Hemoccult stool sample has been requested.  If not  sure of any obvious source, could consider diagnostic catheterization unless significant ostial/proximal disease.   13/03/22, MD, [facility] 02/22/2021, 6:13 AM Office: [phone] Fax: [phone] Pager: [phone]

## 2021-02-22 NOTE — TOC Progression Note (Signed)
Transition of Care [facility]) - Progression Note    Patient Details  Name: Christy Higgins MRN: [id number] Date of Birth: [birth year]  [facility] CM/SW Contact  Leone Haven, RN Phone Number: 02/22/2021, 3:26 PM  Clinical Narrative:    NCM spoke with patient, she is active with [facility] for [facility] for CHF management. Will need resumption orders.  , she receives blood transfusion. Trying to see where bleeding is coming from.          Expected Discharge Plan and Services                                                 Social Determinants of Health (SDOH) Interventions    Readmission Risk Interventions No flowsheet data found.

## 2021-02-22 NOTE — Progress Notes (Addendum)
Subjective:  Patient states that she is still tired and has shortness of breath but much improved since being in the hospital.  She admits to having black stools since she has been in the hospital.  Not previous to this.  Intake/Output from previous day:  I/O last 3 completed shifts: In: 1575.4 [P.O.:720; I.V.:347.1; IV Piggyback:508.3] Out: 500 [Urine:500] Total I/O In: 977.5 [I.V.:500; Blood:477.5] Out: 400 [Urine:400]  Blood pressure 117/66, pulse 62, temperature 99.2 F (37.3 C), temperature source Oral, resp. rate 16, height 5' (1.524 m), weight 54.4 kg, SpO2 93 %. Physical Exam Neck:     Vascular: Carotid bruit (bilateral) present. No JVD.  Cardiovascular:     Rate and Rhythm: Normal rate and regular rhythm.     Pulses: Intact distal pulses.     Heart sounds: Murmur heard.  Midsystolic murmur is present with a grade of 2/6 at the upper left sternal border and lower left sternal border radiating to the apex.    No gallop.  Pulmonary:     Effort: Pulmonary effort is normal.     Breath sounds: Normal breath sounds.  Abdominal:     General: Bowel sounds are normal.     Palpations: Abdomen is soft.  Musculoskeletal:        General: No swelling.    Lab Results: BMP BNP (last 3 results) Recent Labs    02/20/21 1620  BNP 1,255.2*    ProBNP (last 3 results) No results for input(s): PROBNP in the last 8760 hours. BMP Latest Ref Rng & Units 02/22/2021 02/21/2021 02/20/2021  Glucose 70 - 99 mg/dL 86 86 76  BUN 8 - 23 mg/dL _0 Creatinine 0.44 - 1.00 mg/dL 0.81 0.84 0.80  Sodium 135 - 145 mmol/L 137 142 143  Potassium 3.5 - 5.1 mmol/L 3.8 3.3(L) 3.5  Chloride 98 - 111 mmol/L 103 106 108  CO2 22 - 32 mmol/L _1 Calcium 8.9 - 10.3 mg/dL 8.0(L) 7.9(L) 8.4(L)   Hepatic Function Latest Ref Rng & Units 02/21/2021 02/20/2021 02/06/2021  Total Protein 6.5 - 8.1 g/dL 6.7 7.3 6.3(L)  Albumin 3.5 - 5.0 g/dL 2.4(L) 2.6(L) 2.3(L)  AST 15 - 41 U/L 43(H) 33 26  ALT 0 - 44  U/L _2 Alk Phosphatase 38 - 126 U/L 59 59 99  Total Bilirubin 0.3 - 1.2 mg/dL 1.0 1.3(H) 0.5  Bilirubin, Direct 0.1 - 0.5 mg/dL - - -   CBC Latest Ref Rng & Units 02/22/2021 02/22/2021 02/22/2021  WBC 4.0 - 10.5 K/uL 5.8 4.5 4.7  Hemoglobin 12.0 - 15.0 g/dL 6.5(LL) 4.7(LL) 4.9(LL)  Hematocrit 36.0 - 46.0 % 21.0(L) 15.0(L) 15.4(L)  Platelets 150 - 400 K/uL 209 144(L) 145(L)   Lipid Panel     Component Value Date/Time   CHOL 145 02/21/2021 0937   TRIG 71 02/21/2021 0937   HDL 45 02/21/2021 0937   CHOLHDL 3.2 02/21/2021 0937   VLDL 14 02/21/2021 0937   LDLCALC 86 02/21/2021 0937  Cardiac Panel (last 3 results) Recent Labs    02/20/21 2315 02/21/21 0937 02/21/21 1113  CKTOTAL 80  --   --   TROPONINIHS 823* 413* 419*     HEMOGLOBIN A1C Lab Results  Component Value Date   HGBA1C 6.4 (H) 02/06/2021   MPG 136.98 02/06/2021   TSH Recent Labs    02/21/21 2126  TSH 1.427   Imaging:  X-ray of the hand, right: 1. Marked diffuse soft tissue swelling of the hand and  wrist. 2. No acute bony abnormality.   CT angiogram chest 02/20/2021: IMPRESSION: No pulmonary embolism. Bilateral perihilar and suprahilar pulmonary infiltrates and moderate bilateral pleural effusions most in keeping with moderate cardiogenic failure. Mild cardiomegaly.   Chest x-ray PA and lateral view 02/20/2021: Low volume examination with diffuse bilateral interstitial opacity and small pleural effusions. Left upper extremity PICC. The heart and mediastinum are normal. Disc degenerative disease of the thoracic spine. IMPRESSION: Low volume examination with diffuse bilateral interstitial opacity and small pleural effusions, consistent with edema or infection.     Cardiac Studies:  EKG: EKG 02/20/2021: Normal sinus rhythm at rate of 70 bpm, baseline artifact, prolonged QT.   No evidence of ischemia.   Echocardiogram 11/032022:    1. Left ventricular ejection fraction, by estimation, is 60 to 65%. The  left ventricle has normal function. The left ventricle has no regional wall motion abnormalities. Left ventricular diastolic parameters are consistent with Grade I diastolic  dysfunction (impaired relaxation).  2. Right ventricular systolic function is normal. The right ventricular size is normal. There is normal pulmonary artery systolic pressure. The estimated right ventricular systolic pressure is 65.6 mmHg.  3. The mitral valve is normal in structure. Trivial mitral valve regurgitation. No evidence of mitral stenosis.  4. The aortic valve is tricuspid. Aortic valve regurgitation is not visualized. No aortic stenosis is present.  5. The inferior vena cava is normal in size with greater than 50% respiratory variability, suggesting right atrial pressure of 3 mmHg.  6. Pleural effusion noted.  Scheduled Meds:  atorvastatin  80 mg Oral Daily   Chlorhexidine Gluconate Cloth  6 each Topical Daily   furosemide  40 mg Intravenous Q12H   insulin aspart  0-9 Units Subcutaneous TID WC   losartan  100 mg Oral Daily   metoprolol tartrate  25 mg Oral BID   nystatin  5 mL Oral QID   pantoprazole  40 mg Oral Daily   potassium chloride  20 mEq Oral BID   sodium chloride flush  3 mL Intravenous Q12H   sodium chloride flush  3 mL Intravenous Q12H   Continuous Infusions:  sodium chloride     sodium chloride 10 mL/hr at 02/22/21 0530   cefTRIAXone (ROCEPHIN)  IV 2 g (02/21/21 2259)   vancomycin     PRN Meds:.sodium chloride, acetaminophen **OR** acetaminophen, polyethylene glycol, sodium chloride flush, traMADol  Assessment/Plan:  1.  Type II MI, do not suspect ACS as the main etiology, suspect demand ischemia in view of elevated BNP and normal wall motion on recent echocardiogram, high-output heart failure. 2.  Acute pulmonary edema secondary to high-output heart failure. 3.  Acute blood loss anemia, patient admits to having black tarry stools yesterday after she was in the hospital but not previous  to that.  Stool guaiac pending. 4.  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 5.  Bilateral carotid artery bruit, will eventually need carotid duplex.  Recommendation: I have canceled her cardiac catheterization, she needs GI evaluation probably an evaluation for anemia.  Patient received 2 units of packed RBCs.  Heparin has been discontinued.  She is not having any active angina pectoris.  Continue furosemide IV and losartan, and continue metoprolol 25 mg p.o. twice daily for cardiovascular protection.  We will start low-dose in view of acute pulm edema and acute on chronic diastolic heart failure.  In spite of NSTEMI, would not recommend starting aspirin or any other antiplatelet therapy for now until source of acute  severe anemia is found out.  Conservative management from cardiac standpoint for now.  When stable to start aspirin please resume.  Acute pulmonary edema has improved clinically although she is still volume overloaded with regard to lung examination.  For now we will follow on the sidelines.  We can do ischemic work-up in the outpatient basis.   Adrian Prows, MD, [facility] 02/22/2021, 4:48 PM Office: [phone] Fax: [phone] Pager: [phone]

## 2021-02-22 NOTE — Progress Notes (Signed)
TR[facility] night cross cover note:  I was notified by RN of updated hemoglobin of 4.9.  This morning's labs.  This is compared to most recent prior hemoglobin of 7.4 drawn just 2 hours ago, with interval normotensive blood pressure in the absence of any tachycardia, raising suspicion for lab abnormality.  I have ordered stat repeat CBC to further assess.      Newton Pigg, DO Hospitalist

## 2021-02-22 NOTE — Progress Notes (Addendum)
Progress Note    Christy Higgins  [id number] DOB: 1950/09/16  DOA: 02/20/2021 PCP: Shanon Rosser, PA-C    Brief Narrative:     Medical records reviewed and are as summarized below:  Christy Higgins is an 70 y.o. female with medical history significant of cellulitis, diabetes, hypertension, hyperlipidemia presenting for ongoing shortness of breath and fever.   Stay complicated by worsening anemia.    Assessment/Plan:   Principal Problem:   Acute CHF (congestive heart failure) (HCC) Active Problems:   Septic arthritis (HCC)   Demand ischemia (HCC)   HTN (hypertension)   DM (diabetes mellitus) ([place])   Acute respiratory failure with hypoxia (HCC)   CHF (congestive heart failure) (HCC)   NSTEMI (non-ST elevated myocardial infarction) ([place])   Hand abscess   Fever   Eosinophilic pneumonia (HCC)   Acute CHF NSTEMI Acute Respiratory Failure with hypoxia > Patient presenting with worsening shortness of breath and lower extremity edema.  More so lying down flat.  Found to be hypoxic to 86% on room air in the ED now saturating well on 2 L.  Intermittently tachypneic. > CT PE study in the ED was negative for PE did show evidence of edema and effusions consistent with heart failure picture. - Appreciate cardiology recommendations - Trend troponin - Lasix 40 mg IV twice daily - Strict I's/O, daily weights- down 8kg but has only put out 541ml in 3 days so doubt accurate - Trend renal function and electrolytes   Cellulitis/Septic joint > Recently admitted from 10/19 until 10/26 due to cellulitis and septic joint treated with debridement and IV antibiotics, sent home with PICC line.   ceftriaxone and daptomycin. - report having a fever of 101.   - X-ray in the ED with only soft tissue swelling. -blood culture NGTD -ID Consult for abx -CRP decreased -MRI of wrist pending   Diabetes - SSI   Hypertension - Continue home losartan  Thrush -nystatin swish and  swallow  Hypokalemia/hypomagnesemia -replete  Anemia -unclear etiology -CBC showing <5 was inaccurate-- Hgb 6.5 -2018 was last normal -heme stool pending still despite patient having stools -B12 >1000 -Fe mildly low but TIBC also low -smear pending -LDH pending -giving 2 units of PRBC -heparin d/c'd although no sign of bleeding  Family Communication/Anticipated D/C date and plan/Code Status   DVT prophylaxis: Lovenox ordered. Code Status: Full Code.  Disposition Plan: Status is: Inpatient  Remains inpatient appropriate because: needs further cardiac work up         Medical Consultants:   ID cards    Subjective:   No complaints- mouth feels funny  Objective:    Vitals:   02/22/21 0738 02/22/21 1045 02/22/21 1119 02/22/21 1415  BP: 124/75 (!) 119/59 134/62 117/66  Pulse: 65 62 60 62  Resp:   18 16  Temp: (!) 100.6 F (38.1 C) 99.6 F (37.6 C) 99.1 F (37.3 C) 99.2 F (37.3 C)  TempSrc: Oral Oral    SpO2: 93% 93%    Weight:      Height:        Intake/Output Summary (Last 24 hours) at 02/22/2021 1449 Last data filed at 02/22/2021 1045 Gross per 24 hour  Intake 1825.39 ml  Output 500 ml  Net 1325.39 ml   Filed Weights   02/20/21 1952 02/21/21 1500 02/22/21 0351  Weight: 62.1 kg 55.6 kg 54.4 kg    Exam:  General: Appearance:    Chronically ill appearing female in no acute distress  Lungs:     respirations unlabored  Heart:    Normal heart rate.    MS:   All extremities are intact.    Neurologic:   Awake, alert    Data Reviewed:   I have personally reviewed following labs and imaging studies:  Labs: Labs show the following:   Basic Metabolic Panel: Recent Labs  Lab 02/20/21 1620 02/20/21 2315 02/21/21 0425 02/22/21 0313  NA 143  --  142 137  K 3.5  --  3.3* 3.8  CL 108  --  106 103  CO2 23  --  27 26  GLUCOSE 76  --  86 86  BUN 11  --  10 10  CREATININE 0.80  --  0.84 0.81  CALCIUM 8.4*  --  7.9* 8.0*  MG  --  1.2*   --   --    GFR Estimated Creatinine Clearance: 47.1 mL/min (by C-G formula based on SCr of 0.81 mg/dL). Liver Function Tests: Recent Labs  Lab 02/20/21 1620 02/21/21 0425  AST 33 43*  ALT 24 28  ALKPHOS 59 59  BILITOT 1.3* 1.0  PROT 7.3 6.7  ALBUMIN 2.6* 2.4*   No results for input(s): LIPASE, AMYLASE in the last 168 hours. No results for input(s): AMMONIA in the last 168 hours. Coagulation profile Recent Labs  Lab 02/20/21 1620  INR 1.4*    CBC: Recent Labs  Lab 02/20/21 1620 02/21/21 0425 02/22/21 0313 02/22/21 0539 02/22/21 0633 02/22/21 0800 02/22/21 1020  WBC 8.8 7.5 6.8 4.7 4.5 5.8  --   NEUTROABS 6.6  --   --   --   --   --  3.4  HGB 8.1* 7.8* 7.4* 4.9* 4.7* 6.5*  --   HCT 25.2* 24.5* 23.2* 15.4* 15.0* 21.0*  --   MCV 86.0 85.4 83.8 86.0 86.7 85.7  --   PLT 313 261 242 145* 144* 209  --    Cardiac Enzymes: Recent Labs  Lab 02/20/21 2315  CKTOTAL 80   BNP (last 3 results) No results for input(s): PROBNP in the last 8760 hours. CBG: Recent Labs  Lab 02/21/21 1310 02/21/21 1611 02/21/21 08-08-34 02/22/21 0602 02/22/21 1116  GLUCAP 109* 189* 61* 86 81   D-Dimer: No results for input(s): DDIMER in the last 72 hours. Hgb A1c: No results for input(s): HGBA1C in the last 72 hours. Lipid Profile: Recent Labs    02/21/21 0937  CHOL 145  HDL 45  LDLCALC 86  TRIG 71  CHOLHDL 3.2   Thyroid function studies: Recent Labs    02/21/21 Aug 07, 2124  TSH 1.427   Anemia work up: Recent Labs    02/21/21 1316  FERRITIN 425*   Sepsis Labs: Recent Labs  Lab 02/20/21 1620 02/20/21 1713 02/21/21 0425 02/22/21 0313 02/22/21 0539 02/22/21 0633 02/22/21 0800  WBC 8.8  --    < > 6.8 4.7 4.5 5.8  LATICACIDVEN 1.1 1.3  --   --   --   --   --    < > = values in this interval not displayed.    Microbiology Recent Results (from the past 240 hour(s))  Blood Culture (routine x 2)     Status: None (Preliminary result)   Collection Time: 02/20/21  4:20 PM    Specimen: BLOOD  Result Value Ref Range Status   Specimen Description BLOOD PICC LINE  Final   Special Requests   Final    BOTTLES DRAWN AEROBIC AND ANAEROBIC Blood Culture adequate volume  Culture   Final    NO GROWTH 2 DAYS Performed at [facility], [address], [place], [place] [address]    Report Status PENDING  Incomplete  Blood Culture (routine x 2)     Status: None (Preliminary result)   Collection Time: 02/20/21  5:13 PM   Specimen: BLOOD  Result Value Ref Range Status   Specimen Description BLOOD SITE NOT SPECIFIED  Final   Special Requests   Final    BOTTLES DRAWN AEROBIC AND ANAEROBIC Blood Culture adequate volume   Culture   Final    NO GROWTH 2 DAYS Performed at [facility], [address], [place], [place] [address]    Report Status PENDING  Incomplete  Resp Panel by RT-PCR (Flu A&B, Covid) Nasopharyngeal Swab     Status: None   Collection Time: 02/20/21  8:55 PM   Specimen: Nasopharyngeal Swab; Nasopharyngeal(NP) swabs in vial transport medium  Result Value Ref Range Status   SARS Coronavirus 2 by RT PCR NEGATIVE NEGATIVE Final    Comment: (NOTE) SARS-CoV-2 target nucleic acids are NOT DETECTED.  The SARS-CoV-2 RNA is generally detectable in upper respiratory specimens during the acute phase of infection. The lowest concentration of SARS-CoV-2 viral copies this assay can detect is 138 copies/mL. A negative result does not preclude SARS-Cov-2 infection and should not be used as the sole basis for treatment or other patient management decisions. A negative result may occur with  improper specimen collection/handling, submission of specimen other than nasopharyngeal swab, presence of viral mutation(s) within the areas targeted by this assay, and inadequate number of viral copies(<138 copies/mL). A negative result must be combined with clinical observations, patient history, and epidemiological information. The expected result is  Negative.  Fact Sheet for Patients:  BloggerCourse.com  Fact Sheet for Healthcare Providers:  SeriousBroker.it  This test is no t yet approved or cleared by the [place] FDA and  has been authorized for detection and/or diagnosis of SARS-CoV-2 by FDA under an Emergency Use Authorization (EUA). This EUA will remain  in effect (meaning this test can be used) for the duration of the COVID-19 declaration under Section 564(b)(1) of the Act, 21 U.S.C.section 360bbb-3(b)(1), unless the authorization is terminated  or revoked sooner.       Influenza A by PCR NEGATIVE NEGATIVE Final   Influenza B by PCR NEGATIVE NEGATIVE Final    Comment: (NOTE) The Xpert Xpress SARS-CoV-2/FLU/RSV plus assay is intended as an aid in the diagnosis of influenza from Nasopharyngeal swab specimens and should not be used as a sole basis for treatment. Nasal washings and aspirates are unacceptable for Xpert Xpress SARS-CoV-2/FLU/RSV testing.  Fact Sheet for Patients: BloggerCourse.com  Fact Sheet for Healthcare Providers: SeriousBroker.it  This test is not yet approved or cleared by the [place] FDA and has been authorized for detection and/or diagnosis of SARS-CoV-2 by FDA under an Emergency Use Authorization (EUA). This EUA will remain in effect (meaning this test can be used) for the duration of the COVID-19 declaration under Section 564(b)(1) of the Act, 21 U.S.C. section 360bbb-3(b)(1), unless the authorization is terminated or revoked.  Performed at [facility], [address], [place], [place] [address]     Procedures and diagnostic studies:  CT Angio Chest PE W/Cm &/Or Wo Cm  Result Date: 02/20/2021 CLINICAL DATA:  PE suspected, high prob EXAM: CT ANGIOGRAPHY CHEST WITH CONTRAST TECHNIQUE: Multidetector CT imaging of the chest was performed using the standard protocol during  bolus administration of intravenous contrast. Multiplanar CT image reconstructions and MIPs were obtained to evaluate the vascular anatomy. CONTRAST:  81mL OMNIPAQUE IOHEXOL 350 MG/ML SOLN COMPARISON:  04/14/2011 FINDINGS: Cardiovascular: There is adequate opacification of the central pulmonary arteries. No intraluminal filling defect identified to suggest acute pulmonary embolism. The central pulmonary arteries are of normal caliber. No significant coronary artery calcification. Global cardiac size is mildly enlarged. No pericardial effusion. The thoracic aorta is unremarkable. Left subclavian central venous catheter tip is seen within the superior vena cava. Mediastinum/Nodes: No pathologic thoracic adenopathy. Visualized thyroid is unremarkable. Esophagus is unremarkable. Lungs/Pleura: There is moderate bilateral perihilar and suprahilar ground-glass pulmonary infiltrate in a "bat wing" configuration most suggestive of alveolar pulmonary edema. Moderate bilateral pleural effusions are present with compressive atelectasis of the lower lobes bilaterally. No pneumothorax. No central obstructing lesion. Upper Abdomen: No acute abnormality. Musculoskeletal: The osseous structures are age-appropriate. No acute bone abnormality. Review of the MIP images confirms the above findings. IMPRESSION: No pulmonary embolism. Bilateral perihilar and suprahilar pulmonary infiltrates and moderate bilateral pleural effusions most in keeping with moderate cardiogenic failure. Mild cardiomegaly. Electronically Signed   By: Fidela Salisbury M.D.   On: 02/20/2021 19:37   DG Hand Complete Right  Result Date: 02/20/2021 CLINICAL DATA:  Postoperative fever after debridement of the right hand cellulitis. EXAM: RIGHT HAND - COMPLETE 3+ VIEW COMPARISON:  Right wrist x-ray 02/03/2021. MRI right hand 02/06/2021. FINDINGS: There is no acute fracture or dislocation identified. There is stable severe degenerative changes of the radiocarpal joint  with joint space narrowing, subchondral sclerosis and cystic formation. There is diffuse marked soft tissue swelling of the hand and wrist. There is no radiopaque foreign body. There are no cortical erosions. IMPRESSION: 1. Marked diffuse soft tissue swelling of the hand and wrist. 2. No acute bony abnormality. Electronically Signed   By: Ronney Asters M.D.   On: 02/20/2021 16:57   ECHOCARDIOGRAM COMPLETE  Result Date: 02/21/2021    ECHOCARDIOGRAM REPORT   Patient Name:   JAELEEN BREITNER Date of Exam: 02/21/2021 Medical Rec #:  [id number]    Height:       60.0 in Accession #:    [id number]   Weight:       137.0 lb Date of Birth:  09-27-1950    BSA:          1.589 m Patient Age:    89 years     BP:           120/69 mmHg Patient Gender: F            HR:           60 bpm. Exam Location:  Inpatient Procedure: 2D Echo, Cardiac Doppler and Color Doppler Indications:    Dyspnea  History:        Patient has no prior history of Echocardiogram examinations.                 Signs/Symptoms:Dyspnea and Shortness of Breath; Risk                 Factors:Hypertension, Dyslipidemia and Diabetes.  Sonographer:    Dustin Flock RDCS Referring Phys: [id number] [place]  1. Left ventricular ejection fraction, by estimation, is 60 to 65%. The left ventricle has normal function. The left ventricle has no regional wall motion abnormalities. Left ventricular diastolic parameters are consistent with Grade I diastolic dysfunction (impaired relaxation).  2. Right ventricular systolic function is normal. The right ventricular  size is normal. There is normal pulmonary artery systolic pressure. The estimated right ventricular systolic pressure is [id number] mmHg.  3. The mitral valve is normal in structure. Trivial mitral valve regurgitation. No evidence of mitral stenosis.  4. The aortic valve is tricuspid. Aortic valve regurgitation is not visualized. No aortic stenosis is present.  5. The inferior vena cava is normal in size  with greater than 50% respiratory variability, suggesting right atrial pressure of 3 mmHg.  6. Pleural effusion noted. FINDINGS  Left Ventricle: Left ventricular ejection fraction, by estimation, is 60 to 65%. The left ventricle has normal function. The left ventricle has no regional wall motion abnormalities. The left ventricular internal cavity size was normal in size. There is  no left ventricular hypertrophy. Left ventricular diastolic parameters are consistent with Grade I diastolic dysfunction (impaired relaxation). Right Ventricle: The right ventricular size is normal. No increase in right ventricular wall thickness. Right ventricular systolic function is normal. There is normal pulmonary artery systolic pressure. The tricuspid regurgitant velocity is 2.46 m/s, and  with an assumed right atrial pressure of 3 mmHg, the estimated right ventricular systolic pressure is [id number] mmHg. Left Atrium: Left atrial size was normal in size. Right Atrium: Right atrial size was normal in size. Pericardium: Pleural effusion noted. There is no evidence of pericardial effusion. Mitral Valve: The mitral valve is normal in structure. Mild mitral annular calcification. Trivial mitral valve regurgitation. No evidence of mitral valve stenosis. Tricuspid Valve: The tricuspid valve is normal in structure. Tricuspid valve regurgitation is trivial. Aortic Valve: The aortic valve is tricuspid. Aortic valve regurgitation is not visualized. No aortic stenosis is present. Aortic valve mean gradient measures 8.0 mmHg. Aortic valve peak gradient measures 17.5 mmHg. Aortic valve area, by VTI measures 1.87  cm. Pulmonic Valve: The pulmonic valve was normal in structure. Pulmonic valve regurgitation is not visualized. Aorta: The aortic root is normal in size and structure. Venous: The inferior vena cava is normal in size with greater than 50% respiratory variability, suggesting right atrial pressure of 3 mmHg. IAS/Shunts: No atrial level shunt  detected by color flow Doppler.  LEFT VENTRICLE PLAX 2D LVIDd:         4.40 cm     Diastology LVIDs:         2.90 cm     LV e' medial:    6.42 cm/s LV PW:         0.80 cm     LV E/e' medial:  17.3 LV IVS:        0.80 cm     LV e' lateral:   4.90 cm/s LVOT diam:     1.90 cm     LV E/e' lateral: 22.7 LV SV:         83 LV SV Index:   52 LVOT Area:     2.84 cm  LV Volumes (MOD) LV vol d, MOD A4C: 83.2 ml LV vol s, MOD A4C: 25.4 ml LV SV MOD A4C:     83.2 ml RIGHT VENTRICLE RV Basal diam:  3.00 cm RV S prime:     12.10 cm/s TAPSE (M-mode): 2.2 cm LEFT ATRIUM           Index        RIGHT ATRIUM           Index LA diam:      3.40 cm 2.14 cm/m   RA Area:     11.90 cm LA Vol (A2C): 24.3 ml 15.29  ml/m  RA Volume:   28.90 ml  18.18 ml/m LA Vol (A4C): 42.0 ml 26.43 ml/m  AORTIC VALVE AV Area (Vmax):    1.90 cm AV Area (Vmean):   2.08 cm AV Area (VTI):     1.87 cm AV Vmax:           209.00 cm/s AV Vmean:          132.000 cm/s AV VTI:            0.443 m AV Peak Grad:      17.5 mmHg AV Mean Grad:      8.0 mmHg LVOT Vmax:         140.00 cm/s LVOT Vmean:        97.000 cm/s LVOT VTI:          0.292 m LVOT/AV VTI ratio: 0.66  AORTA Ao Root diam: 2.40 cm MITRAL VALVE                TRICUSPID VALVE MV Area (PHT): 1.85 cm     TR Peak grad:   24.2 mmHg MV Decel Time: 410 msec     TR Vmax:        246.00 cm/s MV E velocity: 111.00 cm/s MV A velocity: 130.00 cm/s  SHUNTS MV E/A ratio:  0.85         Systemic VTI:  0.29 m                             Systemic Diam: 1.90 cm Dalton McleanMD Electronically signed by Franki Monte Signature Date/Time: 02/21/2021/4:22:21 PM    Final     Medications:    atorvastatin  80 mg Oral Daily   Chlorhexidine Gluconate Cloth  6 each Topical Daily   furosemide  40 mg Intravenous Q12H   insulin aspart  0-9 Units Subcutaneous TID WC   losartan  100 mg Oral Daily   metoprolol tartrate  25 mg Oral BID   nystatin  5 mL Oral QID   potassium chloride  20 mEq Oral BID   sodium chloride flush  3  mL Intravenous Q12H   sodium chloride flush  3 mL Intravenous Q12H   Continuous Infusions:  sodium chloride     sodium chloride 10 mL/hr at 02/22/21 0530   cefTRIAXone (ROCEPHIN)  IV 2 g (02/21/21 2259)   vancomycin       LOS: 1 day   Geradine Girt  [facility]   How to contact the [facility] Attending or Consulting provider 7A - 7P or covering provider during after hours 7P -7A, for this patient?  Check the care team in [facility] and look for a) attending/consulting [facility] provider listed and b) the [facility] team listed Log into [email or web address] and use [place]'s universal password to access. If you do not have the password, please contact the hospital operator. Locate the [facility] provider you are looking for under [facility] and page to a number that you can be directly reached. If you still have difficulty reaching the provider, please page the [facility] (Director on Call) for the Hospitalists listed on amion for assistance.  02/22/2021, 2:49 PM

## 2021-02-23 ENCOUNTER — Inpatient Hospital Stay (HOSPITAL_COMMUNITY): Payer: BC Managed Care – PPO

## 2021-02-23 DIAGNOSIS — R509 Fever, unspecified: Secondary | ICD-10-CM

## 2021-02-23 DIAGNOSIS — I509 Heart failure, unspecified: Secondary | ICD-10-CM | POA: Diagnosis not present

## 2021-02-23 DIAGNOSIS — M009 Pyogenic arthritis, unspecified: Secondary | ICD-10-CM | POA: Diagnosis not present

## 2021-02-23 DIAGNOSIS — E119 Type 2 diabetes mellitus without complications: Secondary | ICD-10-CM | POA: Diagnosis not present

## 2021-02-23 DIAGNOSIS — L02519 Cutaneous abscess of unspecified hand: Secondary | ICD-10-CM

## 2021-02-23 DIAGNOSIS — I248 Other forms of acute ischemic heart disease: Secondary | ICD-10-CM | POA: Diagnosis not present

## 2021-02-23 LAB — GLUCOSE, CAPILLARY
Glucose-Capillary: 69 mg/dL — ABNORMAL LOW (ref 70–99)
Glucose-Capillary: 78 mg/dL (ref 70–99)
Glucose-Capillary: 80 mg/dL (ref 70–99)
Glucose-Capillary: 82 mg/dL (ref 70–99)
Glucose-Capillary: 100 mg/dL — ABNORMAL HIGH (ref 70–99)

## 2021-02-23 LAB — BASIC METABOLIC PANEL
Anion gap: 8 (ref 5–15)
BUN: 12 mg/dL (ref 8–23)
CO2: 26 mmol/L (ref 22–32)
Calcium: 7.9 mg/dL — ABNORMAL LOW (ref 8.9–10.3)
Chloride: 102 mmol/L (ref 98–111)
Creatinine, Ser: 0.77 mg/dL (ref 0.44–1.00)
GFR, Estimated: >60 mL/min (ref >60)
Glucose, Bld: 72 mg/dL (ref 70–99)
Potassium: 3.7 mmol/L (ref 3.5–5.1)
Sodium: 136 mmol/L (ref 135–145)

## 2021-02-23 LAB — CBC
HCT: 27.3 % — ABNORMAL LOW (ref 36.0–46.0)
Hemoglobin: 8.9 g/dL — ABNORMAL LOW (ref 12.0–15.0)
MCH: 27.9 pg (ref 26.0–34.0)
MCHC: 32.6 g/dL (ref 30.0–36.0)
MCV: 85.6 fL (ref 80.0–100.0)
Platelets: 197 10*3/uL (ref 150–400)
RBC: 3.19 MIL/uL — ABNORMAL LOW (ref 3.87–5.11)
RDW: 15.1 % (ref 11.5–15.5)
WBC: 7.6 10*3/uL (ref 4.0–10.5)
nRBC: 0 % (ref 0.0–0.2)

## 2021-02-23 LAB — MAGNESIUM: Magnesium: 1.4 mg/dL — ABNORMAL LOW (ref 1.7–2.4)

## 2021-02-23 LAB — DIRECT ANTIGLOBULIN TEST (NOT AT ARMC)
DAT, IgG: NEGATIVE
DAT, complement: NEGATIVE

## 2021-02-23 MED ORDER — HEPARIN SODIUM (PORCINE) 5000 UNIT/ML IJ SOLN
5000.0000 [IU] | Freq: Three times a day (TID) | INTRAMUSCULAR | Status: DC
  Administered 2021-02-23 – 2021-02-28 (×15): 5000 [IU] via SUBCUTANEOUS
  Filled 2021-02-23 (×16): qty 1

## 2021-02-23 MED ORDER — GADOBUTROL 1 MMOL/ML IV SOLN
5.0000 mL | Freq: Once | INTRAVENOUS | Status: AC | PRN
  Administered 2021-02-23: 5 mL via INTRAVENOUS

## 2021-02-23 MED ORDER — MAGNESIUM SULFATE 2 GM/50ML IV SOLN
2.0000 g | Freq: Once | INTRAVENOUS | Status: AC
  Administered 2021-02-23: 2 g via INTRAVENOUS
  Filled 2021-02-23: qty 50

## 2021-02-23 NOTE — Progress Notes (Signed)
VASCULAR LAB    Bilateral upper and lower extremity venous Dopplers have been performed.  See CV proc for preliminary results.   Latish Toutant, RVT 02/23/2021, 10:35 AM

## 2021-02-23 NOTE — Plan of Care (Signed)

## 2021-02-23 NOTE — Progress Notes (Signed)
Progress Note    Christy Higgins  [id number] DOB: 1950-09-13  DOA: 02/20/2021 PCP: Christy Rosser, PA-C    Brief Narrative:     Medical records reviewed and are as summarized below:  Christy Higgins is an 70 y.o. female with medical history significant of cellulitis, diabetes, hypertension, hyperlipidemia presenting for ongoing shortness of breath and fever.   Stay complicated by worsening anemia.    Assessment/Plan:   Principal Problem:   Acute CHF (congestive heart failure) (HCC) Active Problems:   Septic arthritis (HCC)   Demand ischemia (HCC)   HTN (hypertension)   DM (diabetes mellitus) ([name])   Acute respiratory failure with hypoxia (HCC)   CHF (congestive heart failure) (HCC)   NSTEMI (non-ST elevated myocardial infarction) ([place])   Hand abscess   Fever   Eosinophilic pneumonia (HCC)   Acute CHF/NSTEMI/Acute Respiratory Failure with hypoxia > Patient presenting with worsening shortness of breath and lower extremity edema.  More so lying down flat.  Found to be hypoxic to 86% on room air in the ED now saturating well on 2 L.  Intermittently tachypneic. > CT PE study in the ED was negative for PE did show evidence of edema and effusions consistent with heart failure picture. - Appreciate cardiology recommendations - Trend troponin - Lasix 40 mg IV twice daily - Strict I's/O, daily weights- down 9 kg - Trend renal function and electrolytes   Cellulitis/Septic joint > Recently admitted from 10/19 until 10/26 due to cellulitis and septic joint treated with debridement and IV antibiotics, sent home with PICC line.   - report having a fever of 101.   - X-ray in the ED with only soft tissue swelling. -blood culture NGTD -ID Consult for abx -CRP decreased -MRI of wrist : defer to ID   Diabetes - SSI   Hypertension - Continue home losartan  Thrush -nystatin swish and swallow  Hypokalemia/hypomagnesemia -replete again  Anemia -unclear etiology -CBC showing  <5 was inaccurate-- Hgb 6.5 -2018 was last normal -heme stool pending still despite patient having stools -B12 >1000 -Fe mildly low but TIBC also low -smear pending -LDH mildly elevated -s/p 2 units of PRBC -heparin d/c'd although no sign of bleeding  Family Communication/Anticipated D/C date and plan/Code Status   DVT prophylaxis: scd Code Status: Full Code.  Disposition Plan: Status is: Inpatient  Remains inpatient appropriate because: needs further cardiac work up         Medical Consultants:   ID cards    Subjective:   No overnight events, sitting on side of bed, says she has been walking around  Objective:    Vitals:   02/22/21 1927 02/23/21 0051 02/23/21 0355 02/23/21 0751  BP: 129/68 (!) 157/60 137/60 125/90  Pulse: (!) 54 64 (!) 56 83  Resp: 17 18 18    Temp: 98.6 F (37 C) (!) 102.7 F (39.3 C) 99.6 F (37.6 C) (!) 100.5 F (38.1 C)  TempSrc: Oral Oral Oral Oral  SpO2: 92% 100% 92% 94%  Weight:   53.5 kg   Height:        Intake/Output Summary (Last 24 hours) at 02/23/2021 1119 Last data filed at 02/23/2021 0400 Gross per 24 hour  Intake 847.5 ml  Output 2050 ml  Net -1202.5 ml   Filed Weights   02/21/21 1500 02/22/21 0351 02/23/21 0355  Weight: 55.6 kg 54.4 kg 53.5 kg    Exam:   General: Appearance:    Well developed, well nourished female in no acute  distress     Lungs:     respirations unlabored  Heart:    Normal heart rate.    MS:   All extremities are intact.    Neurologic:   Awake, alert, pleasant and cooperative      Data Reviewed:   I have personally reviewed following labs and imaging studies:  Labs: Labs show the following:   Basic Metabolic Panel: Recent Labs  Lab 02/20/21 1620 02/20/21 2315 02/21/21 0425 02/22/21 0313 02/23/21 0421  NA 143  --  142 137 136  K 3.5  --  3.3* 3.8 3.7  CL 108  --  106 103 102  CO2 23  --  27 26 26   GLUCOSE 76  --  86 86 72  BUN 11  --  10 10 12   CREATININE 0.80  --  0.84  0.81 0.77  CALCIUM 8.4*  --  7.9* 8.0* 7.9*  MG  --  1.2*  --   --  1.4*   GFR Estimated Creatinine Clearance: 47.7 mL/min (by C-G formula based on SCr of 0.77 mg/dL). Liver Function Tests: Recent Labs  Lab 02/20/21 1620 02/21/21 0425  AST 33 43*  ALT 24 28  ALKPHOS 59 59  BILITOT 1.3* 1.0  PROT 7.3 6.7  ALBUMIN 2.6* 2.4*   No results for input(s): LIPASE, AMYLASE in the last 168 hours. No results for input(s): AMMONIA in the last 168 hours. Coagulation profile Recent Labs  Lab 02/20/21 1620  INR 1.4*    CBC: Recent Labs  Lab 02/20/21 1620 02/21/21 0425 02/22/21 0539 02/22/21 0633 02/22/21 0800 02/22/21 1020 02/22/21 1500 02/23/21 0421  WBC 8.8   < > 4.7 4.5 5.8  --  5.6 7.6  NEUTROABS 6.6  --   --   --   --  3.4  --   --   HGB 8.1*   < > 4.9* 4.7* 6.5*  --  8.9* 8.9*  HCT 25.2*   < > 15.4* 15.0* 21.0*  --  26.7* 27.3*  MCV 86.0   < > 86.0 86.7 85.7  --  85.6 85.6  PLT 313   < > 145* 144* 209  --  206 197   < > = values in this interval not displayed.   Cardiac Enzymes: Recent Labs  Lab 02/20/21 2315  CKTOTAL 80   BNP (last 3 results) No results for input(s): PROBNP in the last 8760 hours. CBG: Recent Labs  Lab 02/22/21 1116 02/22/21 1604 02/22/21 08-08-52 02/23/21 0605 02/23/21 0842  GLUCAP 81 86 95 69* 78   D-Dimer: No results for input(s): DDIMER in the last 72 hours. Hgb A1c: No results for input(s): HGBA1C in the last 72 hours. Lipid Profile: Recent Labs    02/21/21 0937  CHOL 145  HDL 45  LDLCALC 86  TRIG 71  CHOLHDL 3.2   Thyroid function studies: Recent Labs    02/21/21 08-08-2124  TSH 1.427   Anemia work up: Recent Labs    02/21/21 1316  FERRITIN 425*   Sepsis Labs: Recent Labs  Lab 02/20/21 1620 02/20/21 1713 02/21/21 0425 02/22/21 0633 02/22/21 0800 02/22/21 1500 02/23/21 0421  WBC 8.8  --    < > 4.5 5.8 5.6 7.6  LATICACIDVEN 1.1 1.3  --   --   --   --   --    < > = values in this interval not displayed.     Microbiology Recent Results (from the past 240 hour(s))  Blood Culture (routine  x 2)     Status: None (Preliminary result)   Collection Time: 02/20/21  4:20 PM   Specimen: BLOOD  Result Value Ref Range Status   Specimen Description BLOOD PICC LINE  Final   Special Requests   Final    BOTTLES DRAWN AEROBIC AND ANAEROBIC Blood Culture adequate volume   Culture   Final    NO GROWTH 3 DAYS Performed at [facility], [address] [place], [place] [address]    Report Status PENDING  Incomplete  Blood Culture (routine x 2)     Status: None (Preliminary result)   Collection Time: 02/20/21  5:13 PM   Specimen: BLOOD  Result Value Ref Range Status   Specimen Description BLOOD SITE NOT SPECIFIED  Final   Special Requests   Final    BOTTLES DRAWN AEROBIC AND ANAEROBIC Blood Culture adequate volume   Culture   Final    NO GROWTH 3 DAYS Performed at [facility], [address] [place], [place] [address]    Report Status PENDING  Incomplete  Resp Panel by RT-PCR (Flu A&B, Covid) Nasopharyngeal Swab     Status: None   Collection Time: 02/20/21  8:55 PM   Specimen: Nasopharyngeal Swab; Nasopharyngeal(NP) swabs in vial transport medium  Result Value Ref Range Status   SARS Coronavirus 2 by RT PCR NEGATIVE NEGATIVE Final    Comment: (NOTE) SARS-CoV-2 target nucleic acids are NOT DETECTED.  The SARS-CoV-2 RNA is generally detectable in upper respiratory specimens during the acute phase of infection. The lowest concentration of SARS-CoV-2 viral copies this assay can detect is 138 copies/mL. A negative result does not preclude SARS-Cov-2 infection and should not be used as the sole basis for treatment or other patient management decisions. A negative result may occur with  improper specimen collection/handling, submission of specimen other than nasopharyngeal swab, presence of viral mutation(s) within the areas targeted by this assay, and inadequate number of  viral copies(<138 copies/mL). A negative result must be combined with clinical observations, patient history, and epidemiological information. The expected result is Negative.  Fact Sheet for Patients:  BloggerCourse.com  Fact Sheet for Healthcare Providers:  SeriousBroker.it  This test is no t yet approved or cleared by the [place] FDA and  has been authorized for detection and/or diagnosis of SARS-CoV-2 by FDA under an Emergency Use Authorization (EUA). This EUA will remain  in effect (meaning this test can be used) for the duration of the COVID-19 declaration under Section 564(b)(1) of the Act, 21 U.S.C.section 360bbb-3(b)(1), unless the authorization is terminated  or revoked sooner.       Influenza A by PCR NEGATIVE NEGATIVE Final   Influenza B by PCR NEGATIVE NEGATIVE Final    Comment: (NOTE) The Xpert Xpress SARS-CoV-2/FLU/RSV plus assay is intended as an aid in the diagnosis of influenza from Nasopharyngeal swab specimens and should not be used as a sole basis for treatment. Nasal washings and aspirates are unacceptable for Xpert Xpress SARS-CoV-2/FLU/RSV testing.  Fact Sheet for Patients: BloggerCourse.com  Fact Sheet for Healthcare Providers: SeriousBroker.it  This test is not yet approved or cleared by the [place] FDA and has been authorized for detection and/or diagnosis of SARS-CoV-2 by FDA under an Emergency Use Authorization (EUA). This EUA will remain in effect (meaning this test can be used) for the duration of the COVID-19 declaration under Section 564(b)(1) of the Act, 21 U.S.C. section 360bbb-3(b)(1), unless the authorization is terminated or revoked.  Performed at [facility]  [facility], [place] [address], [place], [place] [address]   Culture, blood (Routine X 2) w Reflex to ID Panel     Status: None (Preliminary result)   Collection Time:  02/22/21 10:19 AM   Specimen: BLOOD LEFT HAND  Result Value Ref Range Status   Specimen Description BLOOD LEFT HAND  Final   Special Requests   Final    BOTTLES DRAWN AEROBIC AND ANAEROBIC Blood Culture results may not be optimal due to an inadequate volume of blood received in culture bottles   Culture   Final    NO GROWTH < 24 HOURS Performed at [facility], [place] [address], [place],  [address]    Report Status PENDING  Incomplete  Culture, blood (Routine X 2) w Reflex to ID Panel     Status: None (Preliminary result)   Collection Time: 02/22/21 10:26 AM   Specimen: BLOOD LEFT HAND  Result Value Ref Range Status   Specimen Description BLOOD LEFT HAND  Final   Special Requests   Final    BOTTLES DRAWN AEROBIC AND ANAEROBIC Blood Culture adequate volume   Culture   Final    NO GROWTH < 24 HOURS Performed at [facility], [place] [address] [place],  [address]    Report Status PENDING  Incomplete    Procedures and diagnostic studies:  MR WRIST RIGHT W WO CONTRAST  Result Date: 02/23/2021 CLINICAL DATA:  Wrist pain, fever, suspected infection EXAM: MR OF THE RIGHT WRIST WITHOUT AND WITH CONTRAST TECHNIQUE: Multiplanar multisequence MR imaging of the right wrist was performed both before and after the administration of intravenous contrast. CONTRAST:  20mL GADAVIST GADOBUTROL 1 MMOL/ML IV SOLN COMPARISON:  Radiographs 02/20/2021 and prior MRI of the hand from 01/27/2021 FINDINGS: Ligaments: Torn scapholunate ligament with SLAC wrist. Triangular fibrocartilage: Large tear suspected for example image 12 series 6 in the TFCC disc. Tendons: Accentuated synovial enhancement in the second and fourth extensor compartments. Carpal tunnel/median nerve: There is some mildly accentuated edema in the median nerve for example on image 19 series 3. Low-grade enhancement surrounding the carpal tunnel structures without substantial fluid signal. Guyon's canal: Unremarkable  Joint/cartilage: Severe arthropathy in the radiocarpal joint with prominent subcortical cystic lesions and/or erosions, eroded radial margin of the lunate, SLAC wrist, a prominent chondral thinning. Extensive synovitis in the radiocarpal joint and midcarpal row. Bones/carpal alignment: Large confluent subcortical cysts are present in the distal radius with smaller cystic lesions in the distal ulna. Synovitis in the distal radioulnar joint with associated spurring. SLAC wrist noted. Scattered marrow edema throughout the distal radius, distal ulna, carpus, and proximal metacarpals. Other: The large fluid collection previously seen between the pronator and extending up along the flexor tendons at the level of the distal radius and ulna is no longer well appreciated. Similarly, the fluid collection lateral to the ulna is no longer appreciated. The volar lateral fluid collection along the wrist shown on the prior exam is likewise currently absent. Along the posterior margin of the distal radioulnar joint and posterior to the although we demonstrate a 1.5 by 0.5 by 1.8 cm (volume = 0.7 cm^3) fluid collection with enhancing margins. This may represent extension of the distal radioulnar joint given how it extends towards the dorsal portion of the joint. Infection is not excluded. Dorsal to the proximal fourth and fifth metacarpals, a 0.6 by 0.3 by 1.1 cm (volume = 0.1 cm^3) fluid collection with enhancing margins is present, probably a small abscess.  Improvement in the extensive subcutaneous edema and enhancement in the wrist and tracking into the dorsal hand. IMPRESSION: 1. Substantial improvement with interval resolution of the dominant fluid collections shown on the prior exam, and substantial reduction in the degree of subcutaneous edema and enhancement and generalized soft tissue edema and enhancement. However, there is still substantial synovitis in the distal radioulnar joint, radiocarpal joint, and midcarpal row  region with patchy diffuse marrow edema and enhancement in the region, probably a combination of chronic underlying arthropathy and septic joint. In particular there is a dorsal effusion of the distal radioulnar joint measuring 0.7 cc with enhancing margins. 2. Small abscess dorsal to the proximal fourth and fifth metacarpals, but only about 0.1 cc in volume. 3. SLAC wrist. Electronically Signed   By: Van Clines M.D.   On: 02/23/2021 09:27   ECHOCARDIOGRAM COMPLETE  Result Date: 02/21/2021    ECHOCARDIOGRAM REPORT   Patient Name:   IYA GILPATRICK Date of Exam: 02/21/2021 Medical Rec #:  [id number]    Height:       60.0 in Accession #:    [id number]   Weight:       137.0 lb Date of Birth:  [birth year]    BSA:          1.589 m Patient Age:    74 years     BP:           120/69 mmHg Patient Gender: F            HR:           60 bpm. Exam Location:  Inpatient Procedure: 2D Echo, Cardiac Doppler and Color Doppler Indications:    Dyspnea  History:        Patient has no prior history of Echocardiogram examinations.                 Signs/Symptoms:Dyspnea and Shortness of Breath; Risk                 Factors:Hypertension, Dyslipidemia and Diabetes.  Sonographer:    Dustin Flock RDCS Referring Phys: [id number] Zap  1. Left ventricular ejection fraction, by estimation, is 60 to 65%. The left ventricle has normal function. The left ventricle has no regional wall motion abnormalities. Left ventricular diastolic parameters are consistent with Grade I diastolic dysfunction (impaired relaxation).  2. Right ventricular systolic function is normal. The right ventricular size is normal. There is normal pulmonary artery systolic pressure. The estimated right ventricular systolic pressure is [id number] mmHg.  3. The mitral valve is normal in structure. Trivial mitral valve regurgitation. No evidence of mitral stenosis.  4. The aortic valve is tricuspid. Aortic valve regurgitation is not visualized. No aortic  stenosis is present.  5. The inferior vena cava is normal in size with greater than 50% respiratory variability, suggesting right atrial pressure of 3 mmHg.  6. Pleural effusion noted. FINDINGS  Left Ventricle: Left ventricular ejection fraction, by estimation, is 60 to 65%. The left ventricle has normal function. The left ventricle has no regional wall motion abnormalities. The left ventricular internal cavity size was normal in size. There is  no left ventricular hypertrophy. Left ventricular diastolic parameters are consistent with Grade I diastolic dysfunction (impaired relaxation). Right Ventricle: The right ventricular size is normal. No increase in right ventricular wall thickness. Right ventricular systolic function is normal. There is normal pulmonary artery systolic pressure. The tricuspid regurgitant velocity is 2.46 m/s, and  with an assumed right atrial  pressure of 3 mmHg, the estimated right ventricular systolic pressure is [id number] mmHg. Left Atrium: Left atrial size was normal in size. Right Atrium: Right atrial size was normal in size. Pericardium: Pleural effusion noted. There is no evidence of pericardial effusion. Mitral Valve: The mitral valve is normal in structure. Mild mitral annular calcification. Trivial mitral valve regurgitation. No evidence of mitral valve stenosis. Tricuspid Valve: The tricuspid valve is normal in structure. Tricuspid valve regurgitation is trivial. Aortic Valve: The aortic valve is tricuspid. Aortic valve regurgitation is not visualized. No aortic stenosis is present. Aortic valve mean gradient measures 8.0 mmHg. Aortic valve peak gradient measures 17.5 mmHg. Aortic valve area, by VTI measures 1.87  cm. Pulmonic Valve: The pulmonic valve was normal in structure. Pulmonic valve regurgitation is not visualized. Aorta: The aortic root is normal in size and structure. Venous: The inferior vena cava is normal in size with greater than 50% respiratory variability, suggesting  right atrial pressure of 3 mmHg. IAS/Shunts: No atrial level shunt detected by color flow Doppler.  LEFT VENTRICLE PLAX 2D LVIDd:         4.40 cm     Diastology LVIDs:         2.90 cm     LV e' medial:    6.42 cm/s LV PW:         0.80 cm     LV E/e' medial:  17.3 LV IVS:        0.80 cm     LV e' lateral:   4.90 cm/s LVOT diam:     1.90 cm     LV E/e' lateral: 22.7 LV SV:         83 LV SV Index:   52 LVOT Area:     2.84 cm  LV Volumes (MOD) LV vol d, MOD A4C: 83.2 ml LV vol s, MOD A4C: 25.4 ml LV SV MOD A4C:     83.2 ml RIGHT VENTRICLE RV Basal diam:  3.00 cm RV S prime:     12.10 cm/s TAPSE (M-mode): 2.2 cm LEFT ATRIUM           Index        RIGHT ATRIUM           Index LA diam:      3.40 cm 2.14 cm/m   RA Area:     11.90 cm LA Vol (A2C): 24.3 ml 15.29 ml/m  RA Volume:   28.90 ml  18.18 ml/m LA Vol (A4C): 42.0 ml 26.43 ml/m  AORTIC VALVE AV Area (Vmax):    1.90 cm AV Area (Vmean):   2.08 cm AV Area (VTI):     1.87 cm AV Vmax:           209.00 cm/s AV Vmean:          132.000 cm/s AV VTI:            0.443 m AV Peak Grad:      17.5 mmHg AV Mean Grad:      8.0 mmHg LVOT Vmax:         140.00 cm/s LVOT Vmean:        97.000 cm/s LVOT VTI:          0.292 m LVOT/AV VTI ratio: 0.66  AORTA Ao Root diam: 2.40 cm MITRAL VALVE                TRICUSPID VALVE MV Area (PHT): 1.85 cm     TR Peak grad:  24.2 mmHg MV Decel Time: 410 msec     TR Vmax:        246.00 cm/s MV E velocity: 111.00 cm/s MV A velocity: 130.00 cm/s  SHUNTS MV E/A ratio:  0.85         Systemic VTI:  0.29 m                             Systemic Diam: 1.90 cm Dalton McleanMD Electronically signed by Franki Monte Signature Date/Time: 02/21/2021/4:22:21 PM    Final    VAS [place] LOWER EXTREMITY VENOUS (DVT)  Result Date: 02/23/2021  Lower Venous DVT Study Patient Name:  PRESLY GINES  Date of Exam:   02/23/2021 Medical Rec #: [id number]     Accession #:    [id number] Date of Birth: [birth year]     Patient Gender: F Patient Age:   74 years Exam Location:  [facility] Procedure:      VAS [place] LOWER EXTREMITY VENOUS (DVT) Referring Phys: Colletta Maryland DIXON --------------------------------------------------------------------------------  Indications: Fever, septic joint and abscess in metacarpals in the right hand.  Comparison Study: No prior study Performing Technologist: Sharion Dove RVS  Examination Guidelines: A complete evaluation includes B-mode imaging, spectral Doppler, color Doppler, and power Doppler as needed of all accessible portions of each vessel. Bilateral testing is considered an integral part of a complete examination. Limited examinations for reoccurring indications may be performed as noted. The reflux portion of the exam is performed with the patient in reverse Trendelenburg.  +---------+---------------+---------+-----------+----------+--------------+ RIGHT    CompressibilityPhasicitySpontaneityPropertiesThrombus Aging +---------+---------------+---------+-----------+----------+--------------+ CFV      Full           Yes      Yes                                 +---------+---------------+---------+-----------+----------+--------------+ SFJ      Full                                                        +---------+---------------+---------+-----------+----------+--------------+ FV Prox  Full                                                        +---------+---------------+---------+-----------+----------+--------------+ FV Mid   Full                                                        +---------+---------------+---------+-----------+----------+--------------+ FV DistalFull                                                        +---------+---------------+---------+-----------+----------+--------------+ PFV      Full                                                        +---------+---------------+---------+-----------+----------+--------------+  POP      Full           Yes      Yes                                  +---------+---------------+---------+-----------+----------+--------------+ PTV      Full                                                        +---------+---------------+---------+-----------+----------+--------------+ PERO     Full                                                        +---------+---------------+---------+-----------+----------+--------------+   +---------+---------------+---------+-----------+----------+--------------+ LEFT     CompressibilityPhasicitySpontaneityPropertiesThrombus Aging +---------+---------------+---------+-----------+----------+--------------+ CFV      Full           Yes      Yes                                 +---------+---------------+---------+-----------+----------+--------------+ SFJ      Full                                                        +---------+---------------+---------+-----------+----------+--------------+ FV Prox  Full                                                        +---------+---------------+---------+-----------+----------+--------------+ FV Mid   Full                                                        +---------+---------------+---------+-----------+----------+--------------+ FV DistalFull                                                        +---------+---------------+---------+-----------+----------+--------------+ PFV      Full                                                        +---------+---------------+---------+-----------+----------+--------------+ POP      Full           Yes      Yes                                 +---------+---------------+---------+-----------+----------+--------------+  PTV      Full                                                        +---------+---------------+---------+-----------+----------+--------------+ PERO     Full                                                         +---------+---------------+---------+-----------+----------+--------------+     Summary: BILATERAL: - No evidence of deep vein thrombosis seen in the lower extremities, bilaterally. -No evidence of popliteal cyst, bilaterally.   *See table(s) above for measurements and observations.    Preliminary    VAS [place] UPPER EXTREMITY VENOUS DUPLEX  Result Date: 02/23/2021 UPPER VENOUS STUDY  Patient Name:  CORENNE ISHIMOTO  Date of Exam:   02/23/2021 Medical Rec #: [id number]     Accession #:    [id number] Date of Birth: 02-Nov-1950     Patient Gender: F Patient Age:   53 years Exam [facility]  [facility] Procedure:      VAS [place] UPPER EXTREMITY VENOUS DUPLEX Referring Phys: Colletta Maryland DIXON --------------------------------------------------------------------------------  Indications: Fever, septic joint and abscess in metacarpals in the right hand Limitations: Bandages, line (left forearm). Comparison Study: No prior study Performing Technologist: Sharion Dove RVS  Right Findings: +----------+------------+---------+-----------+----------+-------+ RIGHT     CompressiblePhasicitySpontaneousPropertiesSummary +----------+------------+---------+-----------+----------+-------+ IJV           Full       Yes       Yes                      +----------+------------+---------+-----------+----------+-------+ Subclavian               Yes       Yes                      +----------+------------+---------+-----------+----------+-------+ Axillary                 Yes       Yes                      +----------+------------+---------+-----------+----------+-------+ Brachial      Full                                          +----------+------------+---------+-----------+----------+-------+ Radial        Full                                          +----------+------------+---------+-----------+----------+-------+ Ulnar         Full                                           +----------+------------+---------+-----------+----------+-------+ Cephalic      Full                                          +----------+------------+---------+-----------+----------+-------+  Basilic       Full                                          +----------+------------+---------+-----------+----------+-------+  Left Findings: +----------+------------+---------+-----------+----------+-----------------+ LEFT      CompressiblePhasicitySpontaneousProperties     Summary      +----------+------------+---------+-----------+----------+-----------------+ IJV           Full       Yes       Yes                                +----------+------------+---------+-----------+----------+-----------------+ Subclavian               Yes       Yes                                +----------+------------+---------+-----------+----------+-----------------+ Axillary                 Yes       Yes                                +----------+------------+---------+-----------+----------+-----------------+ Brachial      Full                                                    +----------+------------+---------+-----------+----------+-----------------+ Radial        Full                                                    +----------+------------+---------+-----------+----------+-----------------+ Ulnar         Full                                                    +----------+------------+---------+-----------+----------+-----------------+ Cephalic      Full                                                    +----------+------------+---------+-----------+----------+-----------------+ Basilic     Partial                                 Age Indeterminate +----------+------------+---------+-----------+----------+-----------------+  Summary:  Right: No evidence of deep vein thrombosis in the upper extremity.  Left: No evidence of deep vein thrombosis in the upper  extremity. 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  *See table(s) above for measurements and observations.    Preliminary     Medications:    atorvastatin  80 mg Oral Daily   Chlorhexidine Gluconate Cloth  6 each Topical Daily   furosemide  40 mg Intravenous Q12H   insulin aspart  0-9 Units Subcutaneous TID WC   losartan  100 mg Oral Daily   metoprolol tartrate  25 mg Oral BID   nystatin  5 mL Oral QID   pantoprazole  40 mg Oral Daily   potassium chloride  20 mEq Oral BID   sodium chloride flush  3 mL Intravenous Q12H   sodium chloride flush  3 mL Intravenous Q12H   Continuous Infusions:  sodium chloride     sodium chloride 10 mL/hr at 02/22/21 0530   cefTRIAXone (ROCEPHIN)  IV 2 g (02/22/21 2055)   vancomycin 750 mg (02/22/21 1729)     LOS: 2 days   Geradine Girt  [facility]   How to contact the [facility] Attending or Consulting provider [place] or covering provider during after hours [place], for this patient?  Check the care team in [facility] and look for a) attending/consulting [facility] provider listed and b) the [facility] team listed Log into [email or web address] and use [place]'s universal password to access. If you do not have the password, please contact the hospital operator. Locate the [facility] provider you are looking for under [facility] and page to a number that you can be directly reached. If you still have difficulty reaching the provider, please page the [facility] (Director on Call) for the Hospitalists listed on amion for assistance.  02/23/2021, 11:19 AM

## 2021-02-24 DIAGNOSIS — I509 Heart failure, unspecified: Secondary | ICD-10-CM | POA: Diagnosis not present

## 2021-02-24 DIAGNOSIS — J9601 Acute respiratory failure with hypoxia: Secondary | ICD-10-CM | POA: Diagnosis not present

## 2021-02-24 DIAGNOSIS — I248 Other forms of acute ischemic heart disease: Secondary | ICD-10-CM | POA: Diagnosis not present

## 2021-02-24 DIAGNOSIS — D649 Anemia, unspecified: Secondary | ICD-10-CM

## 2021-02-24 DIAGNOSIS — E119 Type 2 diabetes mellitus without complications: Secondary | ICD-10-CM | POA: Diagnosis not present

## 2021-02-24 DIAGNOSIS — R509 Fever, unspecified: Secondary | ICD-10-CM | POA: Diagnosis not present

## 2021-02-24 DIAGNOSIS — L02519 Cutaneous abscess of unspecified hand: Secondary | ICD-10-CM | POA: Diagnosis not present

## 2021-02-24 LAB — BASIC METABOLIC PANEL
Anion gap: 7 (ref 5–15)
BUN: 12 mg/dL (ref 8–23)
CO2: 29 mmol/L (ref 22–32)
Calcium: 8.1 mg/dL — ABNORMAL LOW (ref 8.9–10.3)
Chloride: 103 mmol/L (ref 98–111)
Creatinine, Ser: 0.86 mg/dL (ref 0.44–1.00)
GFR, Estimated: >60 mL/min (ref >60)
Glucose, Bld: 80 mg/dL (ref 70–99)
Potassium: 3.9 mmol/L (ref 3.5–5.1)
Sodium: 139 mmol/L (ref 135–145)

## 2021-02-24 LAB — GLUCOSE, CAPILLARY
Glucose-Capillary: 76 mg/dL (ref 70–99)
Glucose-Capillary: 101 mg/dL — ABNORMAL HIGH (ref 70–99)
Glucose-Capillary: 91 mg/dL (ref 70–99)
Glucose-Capillary: 91 mg/dL (ref 70–99)

## 2021-02-24 LAB — CBC
HCT: 26.4 % — ABNORMAL LOW (ref 36.0–46.0)
Hemoglobin: 8.4 g/dL — ABNORMAL LOW (ref 12.0–15.0)
MCH: 27.7 pg (ref 26.0–34.0)
MCHC: 31.8 g/dL (ref 30.0–36.0)
MCV: 87.1 fL (ref 80.0–100.0)
Platelets: 203 10*3/uL (ref 150–400)
RBC: 3.03 MIL/uL — ABNORMAL LOW (ref 3.87–5.11)
RDW: 15.8 % — ABNORMAL HIGH (ref 11.5–15.5)
WBC: 5.3 10*3/uL (ref 4.0–10.5)
nRBC: 0 % (ref 0.0–0.2)

## 2021-02-24 LAB — PARVOVIRUS B19 ANTIBODY, IGG AND IGM
Parovirus B19 IgG Abs: 0.5 index (ref 0.0–0.8)
Parovirus B19 IgM Abs: 0.1 index (ref 0.0–0.8)

## 2021-02-24 LAB — VANCOMYCIN, PEAK: Vancomycin Pk: 40 ug/mL (ref 30–40)

## 2021-02-24 LAB — OCCULT BLOOD X 1 CARD TO LAB, STOOL: Fecal Occult Bld: POSITIVE — AB

## 2021-02-24 MED ORDER — LOSARTAN POTASSIUM 50 MG PO TABS
50.0000 mg | ORAL_TABLET | Freq: Every day | ORAL | Status: DC
  Administered 2021-02-25: 50 mg via ORAL
  Filled 2021-02-24 (×2): qty 1

## 2021-02-24 MED ORDER — METOPROLOL TARTRATE 12.5 MG HALF TABLET
12.5000 mg | ORAL_TABLET | Freq: Two times a day (BID) | ORAL | Status: DC
  Administered 2021-02-24 – 2021-02-28 (×6): 12.5 mg via ORAL
  Filled 2021-02-24 (×9): qty 1

## 2021-02-24 NOTE — Plan of Care (Signed)

## 2021-02-24 NOTE — Progress Notes (Signed)
Progress Note    Christy Higgins  [id number] DOB: [birth year]  DOA: 02/20/2021 PCP: Shanon Rosser, PA-C    Brief Narrative:     Medical records reviewed and are as summarized below:  Christy Higgins is an 70 y.o. female with medical history significant of cellulitis, diabetes, hypertension, hyperlipidemia presenting for ongoing shortness of breath and fever.   Stay complicated by worsening anemia.    Assessment/Plan:   Principal Problem:   Acute CHF (congestive heart failure) (HCC) Active Problems:   Septic arthritis (HCC)   Demand ischemia (HCC)   HTN (hypertension)   DM (diabetes mellitus) ([place])   Acute respiratory failure with hypoxia (HCC)   CHF (congestive heart failure) (HCC)   NSTEMI (non-ST elevated myocardial infarction) ([place])   Hand abscess   Fever   Eosinophilic pneumonia (HCC)   Acute CHF/NSTEMI/Acute Respiratory Failure with hypoxia > Patient presenting with worsening shortness of breath and lower extremity edema.  More so lying down flat.  Found to be hypoxic to 86% on room air in the ED now saturating well on 2 L.  Intermittently tachypneic. > CT PE study in the ED was negative for PE did show evidence of edema and effusions consistent with heart failure picture. - Appreciate cardiology recommendations - Trend troponin - hold lasix and other BP meds for low BP - Strict I's/O, daily weights- down 9 kg - Trend renal function and electrolytes   Cellulitis/Septic joint > Recently admitted from 10/19 until 10/26 due to cellulitis and septic joint treated with debridement and IV antibiotics, sent home with PICC line.   - report having a fever of 101.   - X-ray in the ED with only soft tissue swelling. -blood culture NGTD -ID Consult for abx -CRP decreased -MRI of wrist: Substantial improvement with interval resolution of the dominant fluid collections shown on the prior exam, and substantial reduction in the degree of subcutaneous edema and enhancement and  generalized soft tissue edema and enhancement. However, there is still substantial synovitis in the distal radioulnar joint, radiocarpal joint, and midcarpal row region with patchy diffuse marrow edema and enhancement in the region, probably a combination of chronic underlying arthropathy and septic joint. In particular there is a dorsal effusion of the distal radioulnar joint measuring 0.7 cc with enhancing margins. 2. Small abscess dorsal to the proximal fourth and fifth metacarpals, but only about 0.1 cc in volume. -will get ortho consult in AM   Diabetes - SSI   Hypertension - Continue home losartan but lower dose  Thrush -nystatin swish and swallow  Hypokalemia/hypomagnesemia -replete again  Anemia -unclear etiology -CBC showing <5 was inaccurate-- Hgb 6.5 -2018 was last normal -heme stool pending still despite patient having stools -B12 >1000 -Fe mildly low but TIBC also low -smear pending -LDH mildly elevated -s/p 2 units of PRBC -heparin d/c'd although no sign of bleeding -LDH slightly up, bilirubin was slightly elevated-- ? Hemolysis from abx  Family Communication/Anticipated D/C date and plan/Code Status   DVT prophylaxis: scd Code Status: Full Code.  Disposition Plan: Status is: Inpatient  Remains inpatient appropriate because: needs further cardiac work up         Medical Consultants:   ID cards    Subjective:  Asking about going home     Vitals:   02/23/21 2000 02/23/21 2220 02/24/21 0338 02/24/21 0940  BP: (!) 104/52 (!) 118/57 119/67 (!) 97/55  Pulse:   (!) 48 (!) 59  Resp:   18   Temp:  99.3 F (37.4 C) 98.5 F (36.9 C)   TempSrc:  Oral Oral   SpO2:   99%   Weight:   53 kg   Height:        Intake/Output Summary (Last 24 hours) at 02/24/2021 1243 Last data filed at 02/24/2021 0720 Gross per 24 hour  Intake 920 ml  Output 800 ml  Net 120 ml   Filed Weights   02/22/21 0351 02/23/21 0355 02/24/21 0338  Weight: 54.4 kg  53.5 kg 53 kg    Exam:  General: Appearance:    Well developed, well nourished female in no acute distress     Lungs:     Clear to auscultation bilaterally, respirations unlabored  Heart:    Bradycardic. Normal rhythm. No murmurs, rubs, or gallops.   MS:   All extremities are intact.    Neurologic:   Awake, alert, oriented x 3. No apparent focal neurological           defect.         Data Reviewed:   I have personally reviewed following labs and imaging studies:  Labs: Labs show the following:   Basic Metabolic Panel: Recent Labs  Lab 02/20/21 1620 02/20/21 2315 02/21/21 0425 02/22/21 0313 02/23/21 0421 02/24/21 0500  NA 143  --  142 137 136 139  K 3.5  --  3.3* 3.8 3.7 3.9  CL 108  --  106 103 102 103  CO2 23  --  27 26 26 29   GLUCOSE 76  --  86 86 72 80  BUN 11  --  10 10 12 12   CREATININE 0.80  --  0.84 0.81 0.77 0.86  CALCIUM 8.4*  --  7.9* 8.0* 7.9* 8.1*  MG  --  1.2*  --   --  1.4*  --    GFR Estimated Creatinine Clearance: 44.3 mL/min (by C-G formula based on SCr of 0.86 mg/dL). Liver Function Tests: Recent Labs  Lab 02/20/21 1620 02/21/21 0425  AST 33 43*  ALT 24 28  ALKPHOS 59 59  BILITOT 1.3* 1.0  PROT 7.3 6.7  ALBUMIN 2.6* 2.4*   No results for input(s): LIPASE, AMYLASE in the last 168 hours. No results for input(s): AMMONIA in the last 168 hours. Coagulation profile Recent Labs  Lab 02/20/21 1620  INR 1.4*    CBC: Recent Labs  Lab 02/20/21 1620 02/21/21 0425 02/22/21 0633 02/22/21 0800 02/22/21 1020 02/22/21 1500 02/23/21 0421 02/24/21 0500  WBC 8.8   < > 4.5 5.8  --  5.6 7.6 5.3  NEUTROABS 6.6  --   --   --  3.4  --   --   --   HGB 8.1*   < > 4.7* 6.5*  --  8.9* 8.9* 8.4*  HCT 25.2*   < > 15.0* 21.0*  --  26.7* 27.3* 26.4*  MCV 86.0   < > 86.7 85.7  --  85.6 85.6 87.1  PLT 313   < > 144* 209  --  206 197 203   < > = values in this interval not displayed.   Cardiac Enzymes: Recent Labs  Lab 02/20/21 2315  CKTOTAL 80    BNP (last 3 results) No results for input(s): PROBNP in the last 8760 hours. CBG: Recent Labs  Lab 02/23/21 1140 02/23/21 1645 02/23/21 2103 02/24/21 0601 02/24/21 1120  GLUCAP 80 82 100* 76 101*   D-Dimer: No results for input(s): DDIMER in the last 72 hours. Hgb A1c: No  results for input(s): HGBA1C in the last 72 hours. Lipid Profile: No results for input(s): CHOL, HDL, LDLCALC, TRIG, CHOLHDL, LDLDIRECT in the last 72 hours.  Thyroid function studies: Recent Labs    02/21/21 Jul 29, 2124  TSH 1.427   Anemia work up: No results for input(s): VITAMINB12, FOLATE, FERRITIN, TIBC, IRON, RETICCTPCT in the last 72 hours.  Sepsis Labs: Recent Labs  Lab 02/20/21 1620 02/20/21 1713 02/21/21 0425 02/22/21 0800 02/22/21 1500 02/23/21 0421 02/24/21 0500  WBC 8.8  --    < > 5.8 5.6 7.6 5.3  LATICACIDVEN 1.1 1.3  --   --   --   --   --    < > = values in this interval not displayed.    Microbiology Recent Results (from the past 240 hour(s))  Blood Culture (routine x 2)     Status: None (Preliminary result)   Collection Time: 02/20/21  4:20 PM   Specimen: BLOOD  Result Value Ref Range Status   Specimen Description BLOOD PICC LINE  Final   Special Requests   Final    BOTTLES DRAWN AEROBIC AND ANAEROBIC Blood Culture adequate volume   Culture   Final    NO GROWTH 4 DAYS Performed at [facility], [address], [place], [place] [address]    Report Status PENDING  Incomplete  Blood Culture (routine x 2)     Status: None (Preliminary result)   Collection Time: 02/20/21  5:13 PM   Specimen: BLOOD  Result Value Ref Range Status   Specimen Description BLOOD SITE NOT SPECIFIED  Final   Special Requests   Final    BOTTLES DRAWN AEROBIC AND ANAEROBIC Blood Culture adequate volume   Culture   Final    NO GROWTH 4 DAYS Performed at [facility], [address], [place], [place] [address]    Report Status PENDING  Incomplete  Resp Panel by RT-PCR (Flu A&B, Covid)  Nasopharyngeal Swab     Status: None   Collection Time: 02/20/21  8:55 PM   Specimen: Nasopharyngeal Swab; Nasopharyngeal(NP) swabs in vial transport medium  Result Value Ref Range Status   SARS Coronavirus 2 by RT PCR NEGATIVE NEGATIVE Final    Comment: (NOTE) SARS-CoV-2 target nucleic acids are NOT DETECTED.  The SARS-CoV-2 RNA is generally detectable in upper respiratory specimens during the acute phase of infection. The lowest concentration of SARS-CoV-2 viral copies this assay can detect is 138 copies/mL. A negative result does not preclude SARS-Cov-2 infection and should not be used as the sole basis for treatment or other patient management decisions. A negative result may occur with  improper specimen collection/handling, submission of specimen other than nasopharyngeal swab, presence of viral mutation(s) within the areas targeted by this assay, and inadequate number of viral copies(<138 copies/mL). A negative result must be combined with clinical observations, patient history, and epidemiological information. The expected result is Negative.  Fact Sheet for Patients:  EntrepreneurPulse.com.au  Fact Sheet for Healthcare Providers:  IncredibleEmployment.be  This test is no t yet approved or cleared by the [place] FDA and  has been authorized for detection and/or diagnosis of SARS-CoV-2 by FDA under an Emergency Use Authorization (EUA). This EUA will remain  in effect (meaning this test can be used) for the duration of the COVID-19 declaration under Section 564(b)(1) of the Act, 21 U.S.C.section 360bbb-3(b)(1), unless the authorization is terminated  or revoked sooner.       Influenza A by PCR NEGATIVE NEGATIVE Final  Influenza B by PCR NEGATIVE NEGATIVE Final    Comment: (NOTE) The Xpert Xpress SARS-CoV-2/FLU/RSV plus assay is intended as an aid in the diagnosis of influenza from Nasopharyngeal swab specimens and should not be  used as a sole basis for treatment. Nasal washings and aspirates are unacceptable for Xpert Xpress SARS-CoV-2/FLU/RSV testing.  Fact Sheet for Patients: EntrepreneurPulse.com.au  Fact Sheet for Healthcare Providers: IncredibleEmployment.be  This test is not yet approved or cleared by the [place] FDA and has been authorized for detection and/or diagnosis of SARS-CoV-2 by FDA under an Emergency Use Authorization (EUA). This EUA will remain in effect (meaning this test can be used) for the duration of the COVID-19 declaration under Section 564(b)(1) of the Act, 21 U.S.C. section 360bbb-3(b)(1), unless the authorization is terminated or revoked.  Performed at [facility], [place] [address], [place], [place] [address]   Culture, blood (Routine X 2) w Reflex to ID Panel     Status: None (Preliminary result)   Collection Time: 02/22/21 10:19 AM   Specimen: BLOOD LEFT HAND  Result Value Ref Range Status   Specimen Description BLOOD LEFT HAND  Final   Special Requests   Final    BOTTLES DRAWN AEROBIC AND ANAEROBIC Blood Culture results may not be optimal due to an inadequate volume of blood received in culture bottles   Culture   Final    NO GROWTH 2 DAYS Performed at [facility], [place] [address], [place], [place] [address]    Report Status PENDING  Incomplete  Culture, blood (Routine X 2) w Reflex to ID Panel     Status: None (Preliminary result)   Collection Time: 02/22/21 10:26 AM   Specimen: BLOOD LEFT HAND  Result Value Ref Range Status   Specimen Description BLOOD LEFT HAND  Final   Special Requests   Final    BOTTLES DRAWN AEROBIC AND ANAEROBIC Blood Culture adequate volume   Culture   Final    NO GROWTH 2 DAYS Performed at [facility], [place] [address], [place], [place] [address]    Report Status PENDING  Incomplete    Procedures and diagnostic studies:  MR WRIST RIGHT W WO CONTRAST  Result Date:  02/23/2021 CLINICAL DATA:  Wrist pain, fever, suspected infection EXAM: MR OF THE RIGHT WRIST WITHOUT AND WITH CONTRAST TECHNIQUE: Multiplanar multisequence MR imaging of the right wrist was performed both before and after the administration of intravenous contrast. CONTRAST:  57mL GADAVIST GADOBUTROL 1 MMOL/ML IV SOLN COMPARISON:  Radiographs 02/20/2021 and prior MRI of the hand from 01/27/2021 FINDINGS: Ligaments: Torn scapholunate ligament with SLAC wrist. Triangular fibrocartilage: Large tear suspected for example image 12 series 6 in the TFCC disc. Tendons: Accentuated synovial enhancement in the second and fourth extensor compartments. Carpal tunnel/median nerve: There is some mildly accentuated edema in the median nerve for example on image 19 series 3. Low-grade enhancement surrounding the carpal tunnel structures without substantial fluid signal. Guyon's canal: Unremarkable Joint/cartilage: Severe arthropathy in the radiocarpal joint with prominent subcortical cystic lesions and/or erosions, eroded radial margin of the lunate, SLAC wrist, a prominent chondral thinning. Extensive synovitis in the radiocarpal joint and midcarpal row. Bones/carpal alignment: Large confluent subcortical cysts are present in the distal radius with smaller cystic lesions in the distal ulna. Synovitis in the distal radioulnar joint with associated spurring. SLAC wrist noted. Scattered marrow edema throughout the distal radius, distal ulna, carpus, and proximal metacarpals. Other: The large fluid collection previously seen between the  pronator and extending up along the flexor tendons at the level of the distal radius and ulna is no longer well appreciated. Similarly, the fluid collection lateral to the ulna is no longer appreciated. The volar lateral fluid collection along the wrist shown on the prior exam is likewise currently absent. Along the posterior margin of the distal radioulnar joint and posterior to the although we  demonstrate a 1.5 by 0.5 by 1.8 cm (volume = 0.7 cm^3) fluid collection with enhancing margins. This may represent extension of the distal radioulnar joint given how it extends towards the dorsal portion of the joint. Infection is not excluded. Dorsal to the proximal fourth and fifth metacarpals, a 0.6 by 0.3 by 1.1 cm (volume = 0.1 cm^3) fluid collection with enhancing margins is present, probably a small abscess. Improvement in the extensive subcutaneous edema and enhancement in the wrist and tracking into the dorsal hand. IMPRESSION: 1. Substantial improvement with interval resolution of the dominant fluid collections shown on the prior exam, and substantial reduction in the degree of subcutaneous edema and enhancement and generalized soft tissue edema and enhancement. However, there is still substantial synovitis in the distal radioulnar joint, radiocarpal joint, and midcarpal row region with patchy diffuse marrow edema and enhancement in the region, probably a combination of chronic underlying arthropathy and septic joint. In particular there is a dorsal effusion of the distal radioulnar joint measuring 0.7 cc with enhancing margins. 2. Small abscess dorsal to the proximal fourth and fifth metacarpals, but only about 0.1 cc in volume. 3. SLAC wrist. Electronically Signed   By: Van Clines M.D.   On: 02/23/2021 09:27   VAS [place] LOWER EXTREMITY VENOUS (DVT)  Result Date: 02/23/2021  Lower Venous DVT Study Patient Name:  ACE HOCUTT  Date of Exam:   02/23/2021 Medical Rec #: [id number]     Accession #:    [id number] Date of Birth: [birth year]     Patient Gender: F Patient Age:   42 years Exam Location:  [facility] Procedure:      VAS [place] LOWER EXTREMITY VENOUS (DVT) Referring Phys: Colletta [place] DIXON --------------------------------------------------------------------------------  Indications: Fever, septic joint and abscess in metacarpals in the right hand.  Comparison Study: No prior study Performing  Technologist: Sharion Dove RVS  Examination Guidelines: A complete evaluation includes B-mode imaging, spectral Doppler, color Doppler, and power Doppler as needed of all accessible portions of each vessel. Bilateral testing is considered an integral part of a complete examination. Limited examinations for reoccurring indications may be performed as noted. The reflux portion of the exam is performed with the patient in reverse Trendelenburg.  +---------+---------------+---------+-----------+----------+--------------+ RIGHT    CompressibilityPhasicitySpontaneityPropertiesThrombus Aging +---------+---------------+---------+-----------+----------+--------------+ CFV      Full           Yes      Yes                                 +---------+---------------+---------+-----------+----------+--------------+ SFJ      Full                                                        +---------+---------------+---------+-----------+----------+--------------+ FV Prox  Full                                                        +---------+---------------+---------+-----------+----------+--------------+  FV Mid   Full                                                        +---------+---------------+---------+-----------+----------+--------------+ FV DistalFull                                                        +---------+---------------+---------+-----------+----------+--------------+ PFV      Full                                                        +---------+---------------+---------+-----------+----------+--------------+ POP      Full           Yes      Yes                                 +---------+---------------+---------+-----------+----------+--------------+ PTV      Full                                                        +---------+---------------+---------+-----------+----------+--------------+ PERO     Full                                                         +---------+---------------+---------+-----------+----------+--------------+   +---------+---------------+---------+-----------+----------+--------------+ LEFT     CompressibilityPhasicitySpontaneityPropertiesThrombus Aging +---------+---------------+---------+-----------+----------+--------------+ CFV      Full           Yes      Yes                                 +---------+---------------+---------+-----------+----------+--------------+ SFJ      Full                                                        +---------+---------------+---------+-----------+----------+--------------+ FV Prox  Full                                                        +---------+---------------+---------+-----------+----------+--------------+ FV Mid   Full                                                        +---------+---------------+---------+-----------+----------+--------------+  FV DistalFull                                                        +---------+---------------+---------+-----------+----------+--------------+ PFV      Full                                                        +---------+---------------+---------+-----------+----------+--------------+ POP      Full           Yes      Yes                                 +---------+---------------+---------+-----------+----------+--------------+ PTV      Full                                                        +---------+---------------+---------+-----------+----------+--------------+ PERO     Full                                                        +---------+---------------+---------+-----------+----------+--------------+     Summary: BILATERAL: - No evidence of deep vein thrombosis seen in the lower extremities, bilaterally. -No evidence of popliteal cyst, bilaterally.   *See table(s) above for measurements and observations. Electronically signed by Sherald Hess MD on  02/23/2021 at 11:48:18 AM.    Final    VAS [place] UPPER EXTREMITY VENOUS DUPLEX  Result Date: 02/23/2021 UPPER VENOUS STUDY  Patient Name:  JEFFREY VOTH  Date of Exam:   02/23/2021 Medical Rec #: [id number]     Accession #:    [id number] Date of Birth: 1950-04-23     Patient Gender: F Patient Age:   82 years Exam Location:  [facility] Procedure:      VAS [place] UPPER EXTREMITY VENOUS DUPLEX Referring Phys: Judeth Cornfield DIXON --------------------------------------------------------------------------------  Indications: Fever, septic joint and abscess in metacarpals in the right hand Limitations: Bandages, line (left forearm). Comparison Study: No prior study Performing Technologist: Sherren Kerns RVS  Right Findings: +----------+------------+---------+-----------+----------+-------+ RIGHT     CompressiblePhasicitySpontaneousPropertiesSummary +----------+------------+---------+-----------+----------+-------+ IJV           Full       Yes       Yes                      +----------+------------+---------+-----------+----------+-------+ Subclavian               Yes       Yes                      +----------+------------+---------+-----------+----------+-------+ Axillary                 Yes       Yes                      +----------+------------+---------+-----------+----------+-------+  Brachial      Full                                          +----------+------------+---------+-----------+----------+-------+ Radial        Full                                          +----------+------------+---------+-----------+----------+-------+ Ulnar         Full                                          +----------+------------+---------+-----------+----------+-------+ Cephalic      Full                                          +----------+------------+---------+-----------+----------+-------+ Basilic       Full                                           +----------+------------+---------+-----------+----------+-------+  Left Findings: +----------+------------+---------+-----------+----------+-----------------+ LEFT      CompressiblePhasicitySpontaneousProperties     Summary      +----------+------------+---------+-----------+----------+-----------------+ IJV           Full       Yes       Yes                                +----------+------------+---------+-----------+----------+-----------------+ Subclavian               Yes       Yes                                +----------+------------+---------+-----------+----------+-----------------+ Axillary                 Yes       Yes                                +----------+------------+---------+-----------+----------+-----------------+ Brachial      Full                                                    +----------+------------+---------+-----------+----------+-----------------+ Radial        Full                                                    +----------+------------+---------+-----------+----------+-----------------+ Ulnar         Full                                                    +----------+------------+---------+-----------+----------+-----------------+  Cephalic      Full                                                    +----------+------------+---------+-----------+----------+-----------------+ Basilic     Partial                                 Age Indeterminate +----------+------------+---------+-----------+----------+-----------------+  Summary:  Right: No evidence of deep vein thrombosis in the upper extremity.  Left: No evidence of deep vein thrombosis in the upper extremity. 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  *See table(s) above for measurements and observations.  Diagnosing physician: Monica Martinez MD Electronically signed by  Monica Martinez MD on 02/23/2021 at 11:48:40 AM.    Final     Medications:    atorvastatin  80 mg Oral Daily   Chlorhexidine Gluconate Cloth  6 each Topical Daily   heparin injection (subcutaneous)  5,000 Units Subcutaneous Q8H   insulin aspart  0-9 Units Subcutaneous TID WC   losartan  50 mg Oral Daily   metoprolol tartrate  12.5 mg Oral BID   nystatin  5 mL Oral QID   pantoprazole  40 mg Oral Daily   potassium chloride  20 mEq Oral BID   sodium chloride flush  3 mL Intravenous Q12H   sodium chloride flush  3 mL Intravenous Q12H   Continuous Infusions:  sodium chloride     sodium chloride 10 mL/hr at 02/22/21 0530   cefTRIAXone (ROCEPHIN)  IV 2 g (02/23/21 2245)   vancomycin 750 mg (02/23/21 1751)     LOS: 3 days   Geradine Girt  [facility]   How to contact the [facility] Attending or Consulting provider [place] or covering provider during after hours [employer], for this patient?  Check the care team in [facility] and look for a) attending/consulting [facility] provider listed and b) the [facility] team listed Log into [email or web address] and use [place]'s universal password to access. If you do not have the password, please contact the hospital operator. Locate the [facility] provider you are looking for under [facility] and page to a number that you can be directly reached. If you still have difficulty reaching the provider, please page the [facility] (Director on Call) for the Hospitalists listed on amion for assistance.  02/24/2021, 12:43 PM

## 2021-02-24 NOTE — Progress Notes (Signed)
[facility]    Date of Admission:  02/20/2021     ID: Christy Higgins is a 70 y.o. female with  fever, acute anemia, nad ongoing right hand pain Principal Problem:   Acute CHF (congestive heart failure) (HCC) Active Problems:   Septic arthritis (HCC)   Demand ischemia (HCC)   HTN (hypertension)   DM (diabetes mellitus) (HCC)   Acute respiratory failure with hypoxia (HCC)   CHF (congestive heart failure) (HCC)   NSTEMI (non-ST elevated myocardial infarction) (HCC)   Hand abscess   Fever   Eosinophilic pneumonia (HCC)    Subjective: Improving since admit. No fevers. Still some hand discomfort  Medications:   atorvastatin  80 mg Oral Daily   Chlorhexidine Gluconate Cloth  6 each Topical Daily   heparin injection (subcutaneous)  5,000 Units Subcutaneous Q8H   insulin aspart  0-9 Units Subcutaneous TID WC   losartan  50 mg Oral Daily   metoprolol tartrate  12.5 mg Oral BID   nystatin  5 mL Oral QID   pantoprazole  40 mg Oral Daily   potassium chloride  20 mEq Oral BID   sodium chloride flush  3 mL Intravenous Q12H   sodium chloride flush  3 mL Intravenous Q12H    Objective: Vital signs in last 24 hours: Temp:  [98.5 F (36.9 C)-100.3 F (37.9 C)] 99.2 F (37.3 C) (11/06 1245) Pulse Rate:  [48-59] 57 (11/06 1245) Resp:  [16-19] 16 (11/06 1245) BP: (94-119)/(51-67) 94/51 (11/06 1245) SpO2:  [93 %-99 %] 93 % (11/06 1245) Weight:  [53 kg] 53 kg (11/06 0338) Physical Exam  Constitutional:  oriented to person, place, and time. appears well-developed and well-nourished. No distress.  HENT: [place]/AT, PERRLA, no scleral icterus Mouth/Throat: Oropharynx is clear and moist. No oropharyngeal exudate.  Cardiovascular: Normal rate, regular rhythm and normal heart sounds. Exam reveals no gallop and no friction rub.  No murmur heard.  Pulmonary/Chest: Effort normal and breath sounds normal. No respiratory distress.  has no wheezes.  Ext; right hand  wrapped Abdominal: Soft. Bowel sounds are normal.  exhibits no distension. There is no tenderness.  Lymphadenopathy: no cervical adenopathy. No axillary adenopathy Neurological: alert and oriented to person, place, and time.  Skin: Skin is warm and dry. No rash noted. No erythema.  Psychiatric: a normal mood and affect.  behavior is normal.   Lab Results Recent Labs    02/23/21 0421 02/24/21 0500  WBC 7.6 5.3  HGB 8.9* 8.4*  HCT 27.3* 26.4*  NA 136 139  K 3.7 3.9  CL 102 103  CO2 26 29  BUN 12 12  CREATININE 0.77 0.86   Liver Panel No results for input(s): PROT, ALBUMIN, AST, ALT, ALKPHOS, BILITOT, BILIDIR, IBILI in the last 72 hours. Sedimentation Rate No results for input(s): ESRSEDRATE in the last 72 hours. C-Reactive Protein No results for input(s): CRP in the last 72 hours.  Microbiology: 11/2 and 11/4 blood cx ngtd Studies/Results: MR WRIST RIGHT W WO CONTRAST  Result Date: 02/23/2021 CLINICAL DATA:  Wrist pain, fever, suspected infection EXAM: MR OF THE RIGHT WRIST WITHOUT AND WITH CONTRAST TECHNIQUE: Multiplanar multisequence MR imaging of the right wrist was performed both before and after the administration of intravenous contrast. CONTRAST:  [id number] GADOBUTROL 1 MMOL/ML IV SOLN COMPARISON:  Radiographs 02/20/2021 and prior MRI of the hand from 01/27/2021 FINDINGS: Ligaments: Torn scapholunate ligament with SLAC wrist. Triangular fibrocartilage: Large tear suspected for example image 12 series 6  in the TFCC disc. Tendons: Accentuated synovial enhancement in the second and fourth extensor compartments. Carpal tunnel/median nerve: There is some mildly accentuated edema in the median nerve for example on image 19 series 3. Low-grade enhancement surrounding the carpal tunnel structures without substantial fluid signal. Guyon's canal: Unremarkable Joint/cartilage: Severe arthropathy in the radiocarpal joint with prominent subcortical cystic lesions and/or erosions, eroded  radial margin of the lunate, SLAC wrist, a prominent chondral thinning. Extensive synovitis in the radiocarpal joint and midcarpal row. Bones/carpal alignment: Large confluent subcortical cysts are present in the distal radius with smaller cystic lesions in the distal ulna. Synovitis in the distal radioulnar joint with associated spurring. SLAC wrist noted. Scattered marrow edema throughout the distal radius, distal ulna, carpus, and proximal metacarpals. Other: The large fluid collection previously seen between the pronator and extending up along the flexor tendons at the level of the distal radius and ulna is no longer well appreciated. Similarly, the fluid collection lateral to the ulna is no longer appreciated. The volar lateral fluid collection along the wrist shown on the prior exam is likewise currently absent. Along the posterior margin of the distal radioulnar joint and posterior to the although we demonstrate a 1.5 by 0.5 by 1.8 cm (volume = 0.7 cm^3) fluid collection with enhancing margins. This may represent extension of the distal radioulnar joint given how it extends towards the dorsal portion of the joint. Infection is not excluded. Dorsal to the proximal fourth and fifth metacarpals, a 0.6 by 0.3 by 1.1 cm (volume = 0.1 cm^3) fluid collection with enhancing margins is present, probably a small abscess. Improvement in the extensive subcutaneous edema and enhancement in the wrist and tracking into the dorsal hand. IMPRESSION: 1. Substantial improvement with interval resolution of the dominant fluid collections shown on the prior exam, and substantial reduction in the degree of subcutaneous edema and enhancement and generalized soft tissue edema and enhancement. However, there is still substantial synovitis in the distal radioulnar joint, radiocarpal joint, and midcarpal row region with patchy diffuse marrow edema and enhancement in the region, probably a combination of chronic underlying arthropathy  and septic joint. In particular there is a dorsal effusion of the distal radioulnar joint measuring 0.7 cc with enhancing margins. 2. Small abscess dorsal to the proximal fourth and fifth metacarpals, but only about 0.1 cc in volume. 3. SLAC wrist. Electronically Signed   By: Van Clines M.D.   On: 02/23/2021 09:27   VAS [place] LOWER EXTREMITY VENOUS (DVT)  Result Date: 02/23/2021  Lower Venous DVT Study Patient Name:  DAVENEY WAGSTER  Date of Exam:   02/23/2021 Medical Rec #: [id number]     Accession #:    [id number] Date of Birth: Jun 30, 1950     Patient Gender: F Patient Age:   72 years Exam Location:  [facility] Procedure:      VAS [place] LOWER EXTREMITY VENOUS (DVT) Referring Phys: Colletta Maryland DIXON --------------------------------------------------------------------------------  Indications: Fever, septic joint and abscess in metacarpals in the right hand.  Comparison Study: No prior study Performing Technologist: Sharion Dove RVS  Examination Guidelines: A complete evaluation includes B-mode imaging, spectral Doppler, color Doppler, and power Doppler as needed of all accessible portions of each vessel. Bilateral testing is considered an integral part of a complete examination. Limited examinations for reoccurring indications may be performed as noted. The reflux portion of the exam is performed with the patient in reverse Trendelenburg.  +---------+---------------+---------+-----------+----------+--------------+ RIGHT    CompressibilityPhasicitySpontaneityPropertiesThrombus Aging +---------+---------------+---------+-----------+----------+--------------+ CFV  Full           Yes      Yes                                 +---------+---------------+---------+-----------+----------+--------------+ SFJ      Full                                                        +---------+---------------+---------+-----------+----------+--------------+ FV Prox  Full                                                         +---------+---------------+---------+-----------+----------+--------------+ FV Mid   Full                                                        +---------+---------------+---------+-----------+----------+--------------+ FV DistalFull                                                        +---------+---------------+---------+-----------+----------+--------------+ PFV      Full                                                        +---------+---------------+---------+-----------+----------+--------------+ POP      Full           Yes      Yes                                 +---------+---------------+---------+-----------+----------+--------------+ PTV      Full                                                        +---------+---------------+---------+-----------+----------+--------------+ PERO     Full                                                        +---------+---------------+---------+-----------+----------+--------------+   +---------+---------------+---------+-----------+----------+--------------+ LEFT     CompressibilityPhasicitySpontaneityPropertiesThrombus Aging +---------+---------------+---------+-----------+----------+--------------+ CFV      Full           Yes      Yes                                 +---------+---------------+---------+-----------+----------+--------------+  SFJ      Full                                                        +---------+---------------+---------+-----------+----------+--------------+ FV Prox  Full                                                        +---------+---------------+---------+-----------+----------+--------------+ FV Mid   Full                                                        +---------+---------------+---------+-----------+----------+--------------+ FV DistalFull                                                         +---------+---------------+---------+-----------+----------+--------------+ PFV      Full                                                        +---------+---------------+---------+-----------+----------+--------------+ POP      Full           Yes      Yes                                 +---------+---------------+---------+-----------+----------+--------------+ PTV      Full                                                        +---------+---------------+---------+-----------+----------+--------------+ PERO     Full                                                        +---------+---------------+---------+-----------+----------+--------------+     Summary: BILATERAL: - No evidence of deep vein thrombosis seen in the lower extremities, bilaterally. -No evidence of popliteal cyst, bilaterally.   *See table(s) above for measurements and observations. Electronically signed by Monica Martinez MD on 02/23/2021 at 11:48:18 AM.    Final    VAS [place] UPPER EXTREMITY VENOUS DUPLEX  Result Date: 02/23/2021 UPPER VENOUS STUDY  Patient Name:  ROTEM LIJEWSKI  Date of Exam:   02/23/2021 Medical Rec #: [id number]     Accession #:    [id number] Date of Birth: 1950/05/18     Patient Gender: F Patient Age:   2 years Exam Location:  [facility] Procedure:      VAS [place] UPPER EXTREMITY VENOUS DUPLEX Referring Phys: Colletta Maryland DIXON --------------------------------------------------------------------------------  Indications: Fever, septic joint and abscess in metacarpals in the right hand Limitations: Bandages, line (left forearm). Comparison Study: No prior study Performing Technologist: Sharion Dove RVS  Right Findings: +----------+------------+---------+-----------+----------+-------+ RIGHT     CompressiblePhasicitySpontaneousPropertiesSummary +----------+------------+---------+-----------+----------+-------+ IJV           Full       Yes       Yes                       +----------+------------+---------+-----------+----------+-------+ Subclavian               Yes       Yes                      +----------+------------+---------+-----------+----------+-------+ Axillary                 Yes       Yes                      +----------+------------+---------+-----------+----------+-------+ Brachial      Full                                          +----------+------------+---------+-----------+----------+-------+ Radial        Full                                          +----------+------------+---------+-----------+----------+-------+ Ulnar         Full                                          +----------+------------+---------+-----------+----------+-------+ Cephalic      Full                                          +----------+------------+---------+-----------+----------+-------+ Basilic       Full                                          +----------+------------+---------+-----------+----------+-------+  Left Findings: +----------+------------+---------+-----------+----------+-----------------+ LEFT      CompressiblePhasicitySpontaneousProperties     Summary      +----------+------------+---------+-----------+----------+-----------------+ IJV           Full       Yes       Yes                                +----------+------------+---------+-----------+----------+-----------------+ Subclavian               Yes       Yes                                +----------+------------+---------+-----------+----------+-----------------+ Axillary  Yes       Yes                                +----------+------------+---------+-----------+----------+-----------------+ Brachial      Full                                                    +----------+------------+---------+-----------+----------+-----------------+ Radial        Full                                                     +----------+------------+---------+-----------+----------+-----------------+ Ulnar         Full                                                    +----------+------------+---------+-----------+----------+-----------------+ Cephalic      Full                                                    +----------+------------+---------+-----------+----------+-----------------+ Basilic     Partial                                 Age Indeterminate +----------+------------+---------+-----------+----------+-----------------+  Summary:  Right: No evidence of deep vein thrombosis in the upper extremity.  Left: No evidence of deep vein thrombosis in the upper extremity. 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  *See table(s) above for measurements and observations.  Diagnosing physician: Sherald Hess MD Electronically signed by Sherald Hess MD on 02/23/2021 at 11:48:40 AM.    Final      Assessment/Plan: Small abscess on residual imaging = continue on vancomycin. Unclear if needs further surgery given size. Would recommend to see if ortho can weigh in.  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 Fever curve improved. Continue on vancomycin  Acute anemia = unclear source. 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 Would still pursue GI work-up  [facility] Pager: [phone]  02/24/2021, 3:34 PM

## 2021-02-24 NOTE — Plan of Care (Signed)

## 2021-02-24 NOTE — Progress Notes (Signed)
[facility] night cross cover note:  I was contacted by patient's RN requesting clarification on whether or not to give upcoming scheduled dose of metoprolol tartrate in the setting of most recent HR in the high 40s.  Per chart review, including review of most recent [facility] progress note, HR's for at least the last 12 hours have been consistently bradycardic, with rates in the 50s.  RN conveys that patient remains asymptomatic, and most recent blood pressure normotensive. I added hold parameters to existing metoprolol tartrate order, pending further review via existing cardiology consult.     Newton Pigg, DO Hospitalist

## 2021-02-25 DIAGNOSIS — L02519 Cutaneous abscess of unspecified hand: Secondary | ICD-10-CM | POA: Diagnosis not present

## 2021-02-25 DIAGNOSIS — D62 Acute posthemorrhagic anemia: Secondary | ICD-10-CM

## 2021-02-25 DIAGNOSIS — R509 Fever, unspecified: Secondary | ICD-10-CM | POA: Diagnosis not present

## 2021-02-25 DIAGNOSIS — M009 Pyogenic arthritis, unspecified: Secondary | ICD-10-CM | POA: Diagnosis not present

## 2021-02-25 LAB — CULTURE, BLOOD (ROUTINE X 2)
Culture: NO GROWTH
Culture: NO GROWTH
Special Requests: ADEQUATE
Special Requests: ADEQUATE

## 2021-02-25 LAB — BASIC METABOLIC PANEL
Anion gap: 7 (ref 5–15)
BUN: 9 mg/dL (ref 8–23)
CO2: 24 mmol/L (ref 22–32)
Calcium: 8.2 mg/dL — ABNORMAL LOW (ref 8.9–10.3)
Chloride: 107 mmol/L (ref 98–111)
Creatinine, Ser: 0.77 mg/dL (ref 0.44–1.00)
GFR, Estimated: >60 mL/min (ref >60)
Glucose, Bld: 83 mg/dL (ref 70–99)
Potassium: 4.4 mmol/L (ref 3.5–5.1)
Sodium: 138 mmol/L (ref 135–145)

## 2021-02-25 LAB — GLUCOSE, CAPILLARY
Glucose-Capillary: 80 mg/dL (ref 70–99)
Glucose-Capillary: 91 mg/dL (ref 70–99)
Glucose-Capillary: 89 mg/dL (ref 70–99)
Glucose-Capillary: 109 mg/dL — ABNORMAL HIGH (ref 70–99)

## 2021-02-25 LAB — CBC
HCT: 28 % — ABNORMAL LOW (ref 36.0–46.0)
Hemoglobin: 8.8 g/dL — ABNORMAL LOW (ref 12.0–15.0)
MCH: 27.4 pg (ref 26.0–34.0)
MCHC: 31.4 g/dL (ref 30.0–36.0)
MCV: 87.2 fL (ref 80.0–100.0)
Platelets: 216 10*3/uL (ref 150–400)
RBC: 3.21 MIL/uL — ABNORMAL LOW (ref 3.87–5.11)
RDW: 15.8 % — ABNORMAL HIGH (ref 11.5–15.5)
WBC: 6.2 10*3/uL (ref 4.0–10.5)
nRBC: 0 % (ref 0.0–0.2)

## 2021-02-25 LAB — MAGNESIUM: Magnesium: 1.6 mg/dL — ABNORMAL LOW (ref 1.7–2.4)

## 2021-02-25 LAB — PATHOLOGIST SMEAR REVIEW

## 2021-02-25 LAB — VANCOMYCIN, TROUGH: Vancomycin Tr: 9 ug/mL — ABNORMAL LOW (ref 15–20)

## 2021-02-25 MED ORDER — CEFTRIAXONE SODIUM 2 G IJ SOLR
2.0000 g | INTRAMUSCULAR | Status: DC
  Administered 2021-02-25 – 2021-02-28 (×4): 2 g via INTRAVENOUS
  Filled 2021-02-25 (×4): qty 20

## 2021-02-25 MED ORDER — PEG-KCL-NACL-NASULF-NA ASC-C 100 G PO SOLR: 1.0000 | Freq: Once | ORAL | Status: DC

## 2021-02-25 MED ORDER — LIDOCAINE 4 % EX CREA
1.0000 "application " | TOPICAL_CREAM | Freq: Once | CUTANEOUS | Status: AC
  Administered 2021-02-25: 1 via TOPICAL
  Filled 2021-02-25: qty 5

## 2021-02-25 MED ORDER — PEG-KCL-NACL-NASULF-NA ASC-C 100 G PO SOLR
0.5000 | Freq: Once | ORAL | Status: AC
  Administered 2021-02-26: 100 g via ORAL
  Filled 2021-02-25: qty 1

## 2021-02-25 MED ORDER — MORPHINE SULFATE (PF) 2 MG/ML IV SOLN
1.0000 mg | Freq: Once | INTRAVENOUS | Status: DC
  Filled 2021-02-25: qty 1

## 2021-02-25 MED ORDER — BISACODYL 5 MG PO TBEC
10.0000 mg | DELAYED_RELEASE_TABLET | Freq: Two times a day (BID) | ORAL | Status: AC
  Administered 2021-02-26 (×2): 10 mg via ORAL
  Filled 2021-02-25 (×2): qty 2

## 2021-02-25 MED ORDER — MAGNESIUM SULFATE 2 GM/50ML IV SOLN
2.0000 g | Freq: Once | INTRAVENOUS | Status: AC
  Administered 2021-02-25: 2 g via INTRAVENOUS
  Filled 2021-02-25: qty 50

## 2021-02-25 NOTE — Consult Note (Addendum)
Attending physician's note   I have taken an interval history, reviewed the chart and examined the patient. I agree with the Advanced Practitioner's note, impression, and recommendations as outlined.   70 year old female with medical history as outlined below, with recent admission in 01/2021 w/ cellulitis and septic joint treated with debridement and IV ABX, admitted with readmitted 11/2 with CHF and demand ischemia.  GI service consulted for noted anemia and FOBT positive stool.  She does report episodic "dark stools" at home, which tends to occur with episodes of constipation.  Otherwise no frank hematochezia or abdominal pain.  No prior EGD or colonoscopy.  No known family history of GI malignancy, IBD, etc.  Hgb nadir 4.7 on 11/4, treated with 1 unit PRBCs.  H/H 8.8/28 with MCV 85 today.  Peripheral smear pending, LDH mildly elevated at 208, with otherwise negative/normal DAT  Ferritin 425, iron 27, TIBC 161, sat 17% Normal B12/folate   1) Normocytic Anemia 2) FOBT positive stool 3) Dark stool  -Iron indices not entirely diagnostic of iron deficiency, but possible for elevated ferritin to be spurious in the setting of infection - Agree with evaluation for hemolysis.  Peripheral smear pending. - I discussed the role/utility of upper endoscopy and colonoscopy with the patient and her son at bedside today.  She would now like to proceed with EGD/colonoscopy to be done as inpatient for diagnostic and therapeutic intent - Continue serial H/H checks with additional blood products needed per protocol - If EGD/colonoscopy unrevealing, depending on peripheral smear results could be reasonable to request Hematology evaluation - Start clears tomorrow with bowel prep tomorrow night in anticipation of patient/colonoscopy on 02/27/2021  4) Right wrist septic arthritis - Continue ABX per primary service and  consulting ID service - Continued management per consulting Orthopedic surgery  The indications, risks, and benefits of EGD and colonoscopy were explained to the patient in detail. Risks include but are not limited to bleeding, perforation, adverse reaction to medications, and cardiopulmonary compromise. Sequelae include but are not limited to the possibility of surgery, prolonged hospitalization, and mortality. The patient verbalized understanding and wished to proceed. All questions answered.   [address] DO, [place] [facility] [phone] office                                                                                   [facility] Consult: 10:10 AM 02/25/2021  LOS: 4 days    Referring Provider: Dr Princella Ion MD  Primary Care Physician:  Shanon Rosser, PA-C Primary Gastroenterologist:  unassigned    Reason for Consultation: Iron def anemia, FOBT positive stool.   HPI: Christy Higgins is a 70 y.o. female.  PMH NIDDM.  Vitiligo.  Hypertension.  Hyperlipidemia.  No previous colonoscopy or EGD.  No previous blood transfusions.  S/p C-section. 10/19 -02/13/2021 admission for management of right upper extremity cellulitis/osteomyelitis, refractory to outpatient Bactrim.  Surgical debridement 10/19, 10/21, closure of wound 10/24.  Blood cultures, aspirates negative for growth discharged on 6 weeks of daptomycin, Rocephin.  Now day 6 for admission.  A few days dyspnea, this occurred when she would lay down at night and was relieved by being upright.  Worsening, fever 101.  Fatigue with activities, nothing profound.  Normocytic anemia noted with Hgb 8.  Ruled in for non-STEMI with troponins anywhere from 100s to 800s. Hgb 4.7 >> 1 PRBC on 11/4 >> 8.8 today.  MCV 85.  FOBT +.  INR 1.4.  platelets normal.   Iron 27, low.  TIBC 161, low.  Iron sats normal 17%.  Ferritin 425.  B12, folate at or above normal. CT angio chest without PE.  Demonstrated perihilar and pulmonary infiltrates,  bilateral pleural effusions, c/w cardiogenic failure.  Mild cardiomegaly.  No pathology in the upper abdomen. 2D echo: LVEF 60 to 16%, grade 1 diastolic dysfunction/impaired relaxation.  No significant structural valve problems or regurgitation.  Pleural effusion.  Cardiologist, Dr. Einar Gip does not suspect ACS, attributes troponins to demand ischemia, high-output heart failure. Patient says she has had black stools infrequently at home when she passes a constipated stool.  Denies recent tarry stools but provider notes mention tarry stools ~11/3.  Dr. Einar Gip canceled the cardiac cath and recommends GI evaluation of anemia.  Home meds include Mobic, though patient does not mention taking this and she denies use of OTC NSAIDs and aspirin products..  Does not have heartburn, indigestion, dysphagia, altered bowel habits, bloody/black stools, nausea, vomiting.  Good appetite.  Does not use PPI or H2 blockers.  After discussing need to pursue upper endoscopy and colonoscopy, patient said she preferred to wait as she has a lot going on.  Has 3 sisters.  Family history negative for autoimmune disorders, inflammatory bowel disease, colon cancer, peptic ulcer disease, internal bleeding, anemia. Married.  Drinks at most 3-4 beers monthly.  Works in Act[employer] at [employer]    Past Medical History:  Diagnosis Date   Diabetes mellitus    High cholesterol    Hypertension     Past Surgical History:  Procedure Laterality Date   CESAREAN SECTION     I & D EXTREMITY Right 02/06/2021   Procedure: IRRIGATION AND DEBRIDEMENT RIGHT HAND;  Surgeon: Iran Planas, MD;  Location: [facility];  Service: Orthopedics;  Laterality: Right;   I & D EXTREMITY Right 02/08/2021   Procedure: IRRIGATION AND DEBRIDEMENT EXTREMITY;  Surgeon: Orene Desanctis, MD;  Location: [facility];  Service: Orthopedics;  Laterality: Right;   I & D EXTREMITY Right 02/11/2021   Procedure: IRRIGATION AND DEBRIDEMENT RIGHT WRIST;  Surgeon: Iran Planas, MD;  Location: [facility];  Service: Orthopedics;  Laterality: Right;   TONSILLECTOMY      Prior to Admission medications   Medication Sig Start Date End Date Taking? Authorizing Provider  acetaminophen-codeine (TYLENOL #3) 300-30 MG tablet Take 1 tablet by mouth every 4 (four) hours as needed for moderate pain.   Yes [provider]  cefTRIAXone (ROCEPHIN) IVPB Inject 2 g into the vein daily. Indication:  Septic Arthritis First Dose: Yes Last Day of Therapy:  03/25/21 Labs - Once weekly:  CBC/D and BMP, Labs - Every other week:  ESR and CRP Method of administration: IV Push Method of administration may be changed at the discretion of home infusion pharmacist based upon assessment of the patient and/or caregiver's ability to self-administer the medication ordered. 02/12/21 03/25/21 Yes Barb Merino, MD  daptomycin (CUBICIN) IVPB Inject 500 mg into the vein daily. Indication:  Septic Arthritis First Dose: Yes Last Day of Therapy:  03/25/21 Labs - Once weekly:  CBC/D, BMP, and CPK Labs - Every other week:  ESR and CRP Method of administration: IV Push Method of  administration may be changed at the discretion of home infusion pharmacist based upon assessment of the patient and/or caregiver's ability to self-administer the medication ordered. 02/12/21 03/25/21 Yes Dorcas Carrow, MD  HYDROcodone-acetaminophen (NORCO/VICODIN) 5-325 MG tablet Take 1 tablet by mouth every 6 (six) hours as needed for moderate pain.   Yes [provider]  losartan (COZAAR) 100 MG tablet Take 100 mg by mouth daily. 12/14/18  Yes [provider]  meloxicam (MOBIC) 7.5 MG tablet Take 7.5 mg by mouth daily. 01/31/21  Yes [provider]  metFORMIN (GLUCOPHAGE-XR) 750 MG 24 hr tablet Take 750 mg by mouth every evening. 12/14/18  Yes [provider]  traMADol (ULTRAM) 50 MG tablet Take 50 mg by mouth every 6 (six) hours as needed for moderate pain. 01/31/21  Yes [provider]    Scheduled Meds:  atorvastatin  80 mg Oral Daily   Chlorhexidine Gluconate Cloth  6 each Topical Daily   heparin injection (subcutaneous)  5,000 Units Subcutaneous Q8H   insulin aspart  0-9 Units Subcutaneous TID WC   losartan  50 mg Oral Daily   metoprolol tartrate  12.5 mg Oral BID   nystatin  5 mL Oral QID   pantoprazole  40 mg Oral Daily   potassium chloride  20 mEq Oral BID   sodium chloride flush  3 mL Intravenous Q12H   sodium chloride flush  3 mL Intravenous Q12H   Infusions:  sodium chloride     sodium chloride 10 mL/hr at 02/22/21 0530   vancomycin 750 mg (02/24/21 1709)   PRN Meds: sodium chloride, acetaminophen **OR** acetaminophen, polyethylene glycol, sodium chloride flush, traMADol   Allergies as of 02/20/2021   (No Known Allergies)    Family History  Problem Relation Age of Onset   Hypertension Mother    Diabetes Father    Hypertension Father    Hypertension Sister    Diabetes Sister    Hypertension Sister    Diabetes Sister    Hypertension Sister    Diabetes Sister     Social History   Socioeconomic History   Marital status: Married    Spouse name: Not on file   Number of children: 1   Years of education: Not on file   Highest education level: Not on file  Occupational History   Not on file  Tobacco Use   Smoking status: Never   Smokeless tobacco: Never  Substance and Sexual Activity   Alcohol use: No   Drug use: No   Sexual activity: Not on file  Other Topics Concern   Not on file  Social History Narrative   Not on file   Social Determinants of Health   Financial Resource Strain: Not on file  Food Insecurity: Not on file  Transportation Needs: Not on file  Physical Activity: Not on file  Stress: Not on file  Social Connections: Not on file  Intimate Partner Violence: Not on file    REVIEW OF SYSTEMS: Constitutional: Fatigue, not profound, improved. ENT:  No nose bleeds Pulm: Orthopnea, improved. CV:  No  palpitations, no LE edema.  GU:  No hematuria, no frequency GI: Per HPI. Heme: Denies unusual or excessive bleeding or bruising. Transfusions: Just the 1 PRBC given on 11/4. Neuro:  No headaches, no peripheral tingling or numbness.  No seizures, no syncope. Derm:  No itching, no rash or sores.  Endocrine:  No sweats or chills.  No polyuria or dysuria Immunization: Has received COVID-19 vaccines.  PHYSICAL EXAM: Vital signs in last 24 hours: Vitals:   02/25/21 0310 02/25/21 1007  BP: 138/62 (!) 119/46  Pulse: 63 71  Resp: 17   Temp: 98.9 F (37.2 C)   SpO2: 97%    Wt Readings from Last 3 Encounters:  02/25/21 52.8 kg  02/07/21 62.2 kg  01/24/19 56.6 kg    General: Pleasant, well-appearing, comfortable. Head: No facial asymmetry or swelling.  Wearing a wig, not removed for the exam. Eyes: Conjunctiva slightly pale.  No scleral icterus.  EOMI Ears: Not hard of hearing. Nose: No discharge or congestion Mouth: Tongue midline.  Oral mucosa moist, pink, clear.  Full dentures in place, not removed for exam. Neck: Bilateral carotid bruits.  No JVD, no masses, no thyromegaly Lungs: Diminished breath sounds globally but clear.  No labored breathing.  No cough Heart: RRR.  On telemetry rate in the 50s, sinus rhythm.  Milf systolic murmer.  S1, S2 present Abdomen: Not tender.  Soft.  No organomegaly, masses, bruits, hernias.  Bowel sounds active..   Rectal: Did not perform DRE. Musc/Skeltl: Some swelling, tenderness and discoloration in right forearm into the hands. Extremities: No pedal/ankle edema. Neurologic: Alert.  Appropriate.  Oriented x3.  Moves all 4 limbs, strength not tested.  No tremors. Skin: Slight changes in the skin of the right forearm with some swelling, mild bruising. Nodes: No cervical adenopathy Psych: Calm, pleasant, cooperative.  Intake/Output from previous day: 11/06 0701 - 11/07 0700 In: 600 [P.O.:600] Out: 400 [Urine:400] Intake/Output this shift: No  intake/output data recorded.  LAB RESULTS: Recent Labs    02/23/21 0421 02/24/21 0500 02/25/21 0030  WBC 7.6 5.3 6.2  HGB 8.9* 8.4* 8.8*  HCT 27.3* 26.4* 28.0*  PLT 197 203 216   BMET Lab Results  Component Value Date   NA 138 02/25/2021   NA 139 02/24/2021   NA 136 02/23/2021   K 4.4 02/25/2021   K 3.9 02/24/2021   K 3.7 02/23/2021   CL 107 02/25/2021   CL 103 02/24/2021   CL 102 02/23/2021   CO2 24 02/25/2021   CO2 29 02/24/2021   CO2 26 02/23/2021   GLUCOSE 83 02/25/2021   GLUCOSE 80 02/24/2021   GLUCOSE 72 02/23/2021   BUN 9 02/25/2021   BUN 12 02/24/2021   BUN 12 02/23/2021   CREATININE 0.77 02/25/2021   CREATININE 0.86 02/24/2021   CREATININE 0.77 02/23/2021   CALCIUM 8.2 (L) 02/25/2021   CALCIUM 8.1 (L) 02/24/2021   CALCIUM 7.9 (L) 02/23/2021   LFT No results for input(s): PROT, ALBUMIN, AST, ALT, ALKPHOS, BILITOT, BILIDIR, IBILI in the last 72 hours. PT/INR Lab Results  Component Value Date   INR 1.4 (H) 02/20/2021   INR 1.01 04/14/2011   Hepatitis Panel No results for input(s): HEPBSAG, HCVAB, HEPAIGM, HEPBIGM in the last 72 hours. C-Diff No components found for: CDIFF Lipase  No results found for: LIPASE  Drugs of Abuse  No results found for: LABOPIA, COCAINSCRNUR, LABBENZ, AMPHETMU, THCU, LABBARB   RADIOLOGY STUDIES: VAS [place] LOWER EXTREMITY VENOUS (DVT)  Result Date: 02/23/2021    Summary: BILATERAL: - No evidence of deep vein thrombosis seen in the lower extremities, bilaterally. -No evidence of popliteal cyst, bilaterally.   Electronically signed by Monica Martinez MD on 02/23/2021 at 11:48:18 AM.    Final    VAS [place] UPPER EXTREMITY VENOUS DUPLEX  Result Date: 02/23/2021 Summary:  Right: No evidence of deep vein thrombosis in the upper extremity.  Left: No evidence  of deep vein thrombosis in the upper extremity. 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   Monica Martinez MD Electronically signed by Monica Martinez MD on 02/23/2021 at 11:48:40 AM.    Final       IMPRESSION:   Iron deficiency anemia.  FOBT positive.  Rigorous/excellent response to 1 PRBC.  No prior EGD or colonoscopy.  Notes mention dark stool a few days ago, which patient denies.  Has had dark stools when constipated on rare occasions in the past.  No previous EGD or colonoscopy.  No significant upper GI/lower GI symptoms.  Patient currently reluctant to undergo inpt EGD and colonoscopy and I did discuss the possibility that delaying procedures will delay diagnosis as to the cause of her anemia and that one cause could be a cancer.  I think she may be amenable to procedures but will let Dr. Bryan Lemma speak with her.  Empiric Protonix 40 mg/daily in place.  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     Myelitis/septic joint.  On prolonged course of Rocephin, daptomycin.     NIDDM.    PLAN:     Recommend colonoscopy, upper endoscopy.  Dr. Bryan Lemma will see patient and hopefully can persuade patient to undergo procedures.  Do not see any barriers to bowel prep   Azucena Freed  02/25/2021, 10:10 AM Phone [phone]

## 2021-02-25 NOTE — Progress Notes (Signed)
Progress Note    Christy Higgins  IWP:[id number] DOB: 20-Jul-1950  DOA: 02/20/2021 PCP: Lindaann Pascal, PA-C    Brief Narrative:     Medical records reviewed and are as summarized below:  Christy Higgins is an 70 y.o. female with medical history significant of cellulitis, diabetes, hypertension, hyperlipidemia presenting for ongoing shortness of breath and fever.   Stay complicated by worsening anemia.    Assessment/Plan:   Principal Problem:   Acute CHF (congestive heart failure) (HCC) Active Problems:   Septic arthritis (HCC)   Demand ischemia (HCC)   HTN (hypertension)   DM (diabetes mellitus) (HCC)   Acute respiratory failure with hypoxia (HCC)   CHF (congestive heart failure) (HCC)   NSTEMI (non-ST elevated myocardial infarction) (HCC)   Hand abscess   Fever   Eosinophilic pneumonia (HCC)   Acute CHF/NSTEMI/Acute Respiratory Failure with hypoxia > Patient presenting with worsening shortness of breath and lower extremity edema.  More so lying down flat.  Found to be hypoxic to 86% on room air in the ED now saturating well on 2 L.  Intermittently tachypneic. > CT PE study in the ED was negative for PE did show evidence of edema and effusions consistent with heart failure picture. - Appreciate cardiology recommendations - Trend troponin - hold lasix and other BP meds for low BP - Strict I's/O, daily weights- down 9 kg - Trend renal function and electrolytes   Cellulitis/Septic joint > Recently admitted from 10/19 until 10/26 due to cellulitis and septic joint treated with debridement and IV antibiotics, sent home with PICC line.   - report having a fever of 101.   - X-ray in the ED with only soft tissue swelling. -blood culture NGTD -ID Consult for abx -CRP decreased -MRI of wrist: Substantial improvement with interval resolution of the dominant fluid collections shown on the prior exam, and substantial reduction in the degree of subcutaneous edema and enhancement and  generalized soft tissue edema and enhancement. However, there is still substantial synovitis in the distal radioulnar joint, radiocarpal joint, and midcarpal row region with patchy diffuse marrow edema and enhancement in the region, probably a combination of chronic underlying arthropathy and septic joint. In particular there is a dorsal effusion of the distal radioulnar joint measuring 0.7 cc with enhancing margins. 2. Small abscess dorsal to the proximal fourth and fifth metacarpals, but only about 0.1 cc in volume. -ortho consult appreciated   Diabetes - SSI   Hypertension - Continue home losartan but lower dose  Thrush -nystatin swish and swallow  Hypokalemia/hypomagnesemia -replete again  Anemia -unclear etiology -CBC showing <5 was inaccurate-- Hgb 6.5 -2018 was last normal -heme stool + -B12 >1000 -Fe mildly low but TIBC also low -s/p 2 units of PRBC -heparin d/c'd although no sign of bleeding -LDH slightly up, bilirubin was slightly elevated-- ? Hemolysis from abx -GI consult appreciated    Family Communication/Anticipated D/C date and plan/Code Status   DVT prophylaxis: scd Code Status: Full Code.  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     Vitals:   02/24/21 1931 02/25/21 0310 02/25/21 1007 02/25/21 1120  BP: (!) 114/55 138/62 (!) 119/46 (!) 108/59  Pulse: (!) 54 63 71 (!) 51  Resp: 18 17  20   Temp: 99.1  F (37.3 C) 98.9 F (37.2 C)  98.7 F (37.1 C)  TempSrc: Oral Oral  Oral  SpO2: 98% 97%  99%  Weight:  52.8 kg    Height:        Intake/Output Summary (Last 24 hours) at 02/25/2021 1157 Last data filed at 02/24/2021 2226 Gross per 24 hour  Intake 360 ml  Output 400 ml  Net -40 ml   Filed Weights   02/23/21 0355 02/24/21 0338 02/25/21 0310   Weight: 53.5 kg 53 kg 52.8 kg    Exam:  General: Appearance:    Well developed, well nourished female in no acute distress     Lungs:      respirations unlabored  Heart:    Bradycardic.   MS:   All extremities are intact.    Neurologic:   Awake, alert, oriented x 3. No apparent focal neurological           defect.           Data Reviewed:   I have personally reviewed following labs and imaging studies:  Labs: Labs show the following:   Basic Metabolic Panel: Recent Labs  Lab 02/20/21 2315 02/21/21 0425 02/22/21 0313 02/23/21 0421 02/24/21 0500 02/25/21 0030  NA  --  142 137 136 139 138  K  --  3.3* 3.8 3.7 3.9 4.4  CL  --  106 103 102 103 107  CO2  --  27 26 26 29 24   GLUCOSE  --  86 86 72 80 83  BUN  --  10 10 12 12 9   CREATININE  --  0.84 0.81 0.77 0.86 0.77  CALCIUM  --  7.9* 8.0* 7.9* 8.1* 8.2*  MG 1.2*  --   --  1.4*  --  1.6*   GFR Estimated Creatinine Clearance: 47.7 mL/min (by C-G formula based on SCr of 0.77 mg/dL). Liver Function Tests: Recent Labs  Lab 02/20/21 1620 02/21/21 0425  AST 33 43*  ALT 24 28  ALKPHOS 59 59  BILITOT 1.3* 1.0  PROT 7.3 6.7  ALBUMIN 2.6* 2.4*   No results for input(s): LIPASE, AMYLASE in the last 168 hours. No results for input(s): AMMONIA in the last 168 hours. Coagulation profile Recent Labs  Lab 02/20/21 1620  INR 1.4*    CBC: Recent Labs  Lab 02/20/21 1620 02/21/21 0425 02/22/21 0800 02/22/21 1020 02/22/21 1500 02/23/21 0421 02/24/21 0500 02/25/21 0030  WBC 8.8   < > 5.8  --  5.6 7.6 5.3 6.2  NEUTROABS 6.6  --   --  3.4  --   --   --   --   HGB 8.1*   < > 6.5*  --  8.9* 8.9* 8.4* 8.8*  HCT 25.2*   < > 21.0*  --  26.7* 27.3* 26.4* 28.0*  MCV 86.0   < > 85.7  --  85.6 85.6 87.1 87.2  PLT 313   < > 209  --  206 197 203 216   < > = values in this interval not displayed.   Cardiac Enzymes: Recent Labs  Lab 02/20/21 2315  CKTOTAL 80   BNP (last 3 results) No results for input(s): PROBNP in  the last 8760 hours. CBG: Recent Labs  Lab 02/24/21 1120 02/24/21 1620 02/24/21 2111 02/25/21 0614 02/25/21 1124  GLUCAP 101* 91 91 80 91   D-Dimer: No results for input(s): DDIMER in the last 72 hours. Hgb A1c: No results for input(s): HGBA1C in the last  72 hours. Lipid Profile: No results for input(s): CHOL, HDL, LDLCALC, TRIG, CHOLHDL, LDLDIRECT in the last 72 hours.  Thyroid function studies: No results for input(s): TSH, T4TOTAL, T3FREE, THYROIDAB in the last 72 hours.  Invalid input(s): FREET3  Anemia work up: No results for input(s): VITAMINB12, FOLATE, FERRITIN, TIBC, IRON, RETICCTPCT in the last 72 hours.  Sepsis Labs: Recent Labs  Lab 02/20/21 1620 02/20/21 1713 02/21/21 0425 02/22/21 1500 02/23/21 0421 02/24/21 0500 02/25/21 0030  WBC 8.8  --    < > 5.6 7.6 5.3 6.2  LATICACIDVEN 1.1 1.3  --   --   --   --   --    < > = values in this interval not displayed.    Microbiology Recent Results (from the past 240 hour(s))  Blood Culture (routine x 2)     Status: None   Collection Time: 02/20/21  4:20 PM   Specimen: BLOOD  Result Value Ref Range Status   Specimen Description BLOOD PICC LINE  Final   Special Requests   Final    BOTTLES DRAWN AEROBIC AND ANAEROBIC Blood Culture adequate volume   Culture   Final    NO GROWTH 5 DAYS Performed at [facility], [address], [place], [place] [address]    Report Status 02/25/2021 FINAL  Final  Blood Culture (routine x 2)     Status: None   Collection Time: 02/20/21  5:13 PM   Specimen: BLOOD  Result Value Ref Range Status   Specimen Description BLOOD SITE NOT SPECIFIED  Final   Special Requests   Final    BOTTLES DRAWN AEROBIC AND ANAEROBIC Blood Culture adequate volume   Culture   Final    NO GROWTH 5 DAYS Performed at [facility], [place] [address], [place], [place] [address]    Report Status 02/25/2021 FINAL  Final  Resp Panel by RT-PCR (Flu A&B, Covid) Nasopharyngeal Swab     Status: None    Collection Time: 02/20/21  8:55 PM   Specimen: Nasopharyngeal Swab; Nasopharyngeal(NP) swabs in vial transport medium  Result Value Ref Range Status   SARS Coronavirus 2 by RT PCR NEGATIVE NEGATIVE Final    Comment: (NOTE) SARS-CoV-2 target nucleic acids are NOT DETECTED.  The SARS-CoV-2 RNA is generally detectable in upper respiratory specimens during the acute phase of infection. The lowest concentration of SARS-CoV-2 viral copies this assay can detect is 138 copies/mL. A negative result does not preclude SARS-Cov-2 infection and should not be used as the sole basis for treatment or other patient management decisions. A negative result may occur with  improper specimen collection/handling, submission of specimen other than nasopharyngeal swab, presence of viral mutation(s) within the areas targeted by this assay, and inadequate number of viral copies(<138 copies/mL). A negative result must be combined with clinical observations, patient history, and epidemiological information. The expected result is Negative.  Fact Sheet for Patients:  EntrepreneurPulse.com.au  Fact Sheet for Healthcare Providers:  IncredibleEmployment.be  This test is no t yet approved or cleared by the [place] FDA and  has been authorized for detection and/or diagnosis of SARS-CoV-2 by FDA under an Emergency Use Authorization (EUA). This EUA will remain  in effect (meaning this test can be used) for the duration of the COVID-19 declaration under Section 564(b)(1) of the Act, 21 U.S.C.section 360bbb-3(b)(1), unless the authorization is terminated  or revoked sooner.       Influenza A by PCR NEGATIVE NEGATIVE Final   Influenza B  by PCR NEGATIVE NEGATIVE Final    Comment: (NOTE) The Xpert Xpress SARS-CoV-2/FLU/RSV plus assay is intended as an aid in the diagnosis of influenza from Nasopharyngeal swab specimens and should not be used as a sole basis for treatment.  Nasal washings and aspirates are unacceptable for Xpert Xpress SARS-CoV-2/FLU/RSV testing.  Fact Sheet for Patients: EntrepreneurPulse.com.au  Fact Sheet for Healthcare Providers: IncredibleEmployment.be  This test is not yet approved or cleared by the [place] FDA and has been authorized for detection and/or diagnosis of SARS-CoV-2 by FDA under an Emergency Use Authorization (EUA). This EUA will remain in effect (meaning this test can be used) for the duration of the COVID-19 declaration under Section 564(b)(1) of the Act, 21 U.S.C. section 360bbb-3(b)(1), unless the authorization is terminated or revoked.  Performed at [facility], [place] [address] [place], [place] [address]   Culture, blood (Routine X 2) w Reflex to ID Panel     Status: None (Preliminary result)   Collection Time: 02/22/21 10:19 AM   Specimen: BLOOD LEFT HAND  Result Value Ref Range Status   Specimen Description BLOOD LEFT HAND  Final   Special Requests   Final    BOTTLES DRAWN AEROBIC AND ANAEROBIC Blood Culture results may not be optimal due to an inadequate volume of blood received in culture bottles   Culture   Final    NO GROWTH 3 DAYS Performed at [facility], [place] [address] [place], [place] [address]    Report Status PENDING  Incomplete  Culture, blood (Routine X 2) w Reflex to ID Panel     Status: None (Preliminary result)   Collection Time: 02/22/21 10:26 AM   Specimen: BLOOD LEFT HAND  Result Value Ref Range Status   Specimen Description BLOOD LEFT HAND  Final   Special Requests   Final    BOTTLES DRAWN AEROBIC AND ANAEROBIC Blood Culture adequate volume   Culture   Final    NO GROWTH 3 DAYS Performed at [facility], [place] [address], [place], [place] [address]    Report Status PENDING  Incomplete    Procedures and diagnostic studies:  No results found.  Medications:    atorvastatin  80 mg Oral Daily   Chlorhexidine Gluconate  Cloth  6 each Topical Daily   heparin injection (subcutaneous)  5,000 Units Subcutaneous Q8H   insulin aspart  0-9 Units Subcutaneous TID WC   losartan  50 mg Oral Daily   metoprolol tartrate  12.5 mg Oral BID   nystatin  5 mL Oral QID   pantoprazole  40 mg Oral Daily   potassium chloride  20 mEq Oral BID   sodium chloride flush  3 mL Intravenous Q12H   sodium chloride flush  3 mL Intravenous Q12H   Continuous Infusions:  sodium chloride     sodium chloride 10 mL/hr at 02/22/21 0530   vancomycin 750 mg (02/24/21 1709)     LOS: 4 days   Geradine Girt  [facility]   How to contact the [facility] Attending or Consulting provider [place] or covering provider during after hours [place], for this patient?  Check the care team in [facility] and look for a) attending/consulting [facility] provider listed and b) the [facility] team listed Log into [email or web address] and use [place]'s universal password to access. If you do not have the password, please contact the hospital operator. Locate the [facility] provider you are looking for under [facility] and page  to a number that you can be directly reached. If you still have difficulty reaching the provider, please page the [facility] (Director on Call) for the Hospitalists listed on amion for assistance.  02/25/2021, 11:57 AM

## 2021-02-25 NOTE — TOC Progression Note (Addendum)
Transition of Care [facility]) - Progression Note    Patient Details  Name: Christy Higgins MRN: [id number] Date of Birth: [birth year]  Transition of Care [facility]) CM/SW Contact  Leone Haven, RN Phone Number: 02/25/2021, 3:41 PM  Clinical Narrative:    Patient is from home, she is active with [facility] for [facility] for IV abx, and Jeri Modena with [facility] provides the iv medication.  She is here with cellulitis and joint debridement, ID following, cont to diurese  with iv lasix also, has picc in. GI consulted to see for anemia, plan for bowel prep tomorrow for colonoscopy on 11/9. Pam is aware and Kandee Keen is aware patient is here.        Expected Discharge Plan and Services                                                 Social Determinants of Health (SDOH) Interventions    Readmission Risk Interventions No flowsheet data found.

## 2021-02-25 NOTE — H&P (View-Only) (Signed)
Attending physician's note   I have taken an interval history, reviewed the chart and examined the patient. I agree with the Advanced Practitioner's note, impression, and recommendations as outlined.   70 year old female with medical history as outlined below, with recent admission in 01/2021 w/ cellulitis and septic joint treated with debridement and IV ABX, admitted with readmitted 11/2 with CHF and demand ischemia.  GI service consulted for noted anemia and FOBT positive stool.  She does report episodic "dark stools" at home, which tends to occur with episodes of constipation.  Otherwise no frank hematochezia or abdominal pain.  No prior EGD or colonoscopy.  No known family history of GI malignancy, IBD, etc.  Hgb nadir 4.7 on 11/4, treated with 1 unit PRBCs.  H/H 8.8/28 with MCV 85 today.  Peripheral smear pending, LDH mildly elevated at 208, with otherwise negative/normal DAT  Ferritin 425, iron 27, TIBC 161, sat 17% Normal B12/folate   1) Normocytic Anemia 2) FOBT positive stool 3) Dark stool  -Iron indices not entirely diagnostic of iron deficiency, but possible for elevated ferritin to be spurious in the setting of infection - Agree with evaluation for hemolysis.  Peripheral smear pending. - I discussed the role/utility of upper endoscopy and colonoscopy with the patient and her son at bedside today.  She would now like to proceed with EGD/colonoscopy to be done as inpatient for diagnostic and therapeutic intent - Continue serial H/H checks with additional blood products needed per protocol - If EGD/colonoscopy unrevealing, depending on peripheral smear results could be reasonable to request Hematology evaluation - Start clears tomorrow with bowel prep tomorrow night in anticipation of patient/colonoscopy on 02/27/2021  4) Right wrist septic arthritis - Continue ABX per primary service and  consulting ID service - Continued management per consulting Orthopedic surgery  The indications, risks, and benefits of EGD and colonoscopy were explained to the patient in detail. Risks include but are not limited to bleeding, perforation, adverse reaction to medications, and cardiopulmonary compromise. Sequelae include but are not limited to the possibility of surgery, prolonged hospitalization, and mortality. The patient verbalized understanding and wished to proceed. All questions answered.   [address] [place], [place] [phone] office                                                                                   [facility] Consult: 10:10 AM 02/25/2021  LOS: 4 days    Referring Provider: Dr Princella Ion MD  Primary Care Physician:  Shanon Rosser, PA-C Primary Gastroenterologist:  unassigned    Reason for Consultation: Iron def anemia, FOBT positive stool.   HPI: Christy Higgins is a 70 y.o. female.  PMH NIDDM.  Vitiligo.  Hypertension.  Hyperlipidemia.  No previous colonoscopy or EGD.  No previous blood transfusions.  S/p C-section. 10/19 -02/13/2021 admission for management of right upper extremity cellulitis/osteomyelitis, refractory to outpatient Bactrim.  Surgical debridement 10/19, 10/21, closure of wound 10/24.  Blood cultures, aspirates negative for growth discharged on 6 weeks of daptomycin, Rocephin.  Now day 6 for admission.  A few days dyspnea, this occurred when she would lay down at night and was relieved by being upright.  Worsening, fever 101.  Fatigue with activities, nothing profound.  Normocytic anemia noted with Hgb 8.  Ruled in for non-STEMI with troponins anywhere from 100s to 800s. Hgb 4.7 >> 1 PRBC on 11/4 >> 8.8 today.  MCV 85.  FOBT +.  INR 1.4.  platelets normal.   Iron 27, low.  TIBC 161, low.  Iron sats normal 17%.  Ferritin 425.  B12, folate at or above normal. CT angio chest without PE.  Demonstrated perihilar and pulmonary infiltrates,  bilateral pleural effusions, c/w cardiogenic failure.  Mild cardiomegaly.  No pathology in the upper abdomen. 2D echo: LVEF 60 to 88%, grade 1 diastolic dysfunction/impaired relaxation.  No significant structural valve problems or regurgitation.  Pleural effusion.  Cardiologist, Dr. Einar Gip does not suspect ACS, attributes troponins to demand ischemia, high-output heart failure. Patient says she has had black stools infrequently at home when she passes a constipated stool.  Denies recent tarry stools but provider notes mention tarry stools ~11/3.  Dr. Einar Gip canceled the cardiac cath and recommends GI evaluation of anemia.  Home meds include Mobic, though patient does not mention taking this and she denies use of OTC NSAIDs and aspirin products..  Does not have heartburn, indigestion, dysphagia, altered bowel habits, bloody/black stools, nausea, vomiting.  Good appetite.  Does not use PPI or H2 blockers.  After discussing need to pursue upper endoscopy and colonoscopy, patient said she preferred to wait as she has a lot going on.  Has 3 sisters.  Family history negative for autoimmune disorders, inflammatory bowel disease, colon cancer, peptic ulcer disease, internal bleeding, anemia. Married.  Drinks at most 3-4 beers monthly.  Works in Act[employer] at [employer]    Past Medical History:  Diagnosis Date   Diabetes mellitus    High cholesterol    Hypertension     Past Surgical History:  Procedure Laterality Date   CESAREAN SECTION     I & D EXTREMITY Right 02/06/2021   Procedure: IRRIGATION AND DEBRIDEMENT RIGHT HAND;  Surgeon: Iran Planas, MD;  Location: [place];  Service: Orthopedics;  Laterality: Right;   I & D EXTREMITY Right 02/08/2021   Procedure: IRRIGATION AND DEBRIDEMENT EXTREMITY;  Surgeon: Orene Desanctis, MD;  Location: [place];  Service: Orthopedics;  Laterality: Right;   I & D EXTREMITY Right 02/11/2021   Procedure: IRRIGATION AND DEBRIDEMENT RIGHT WRIST;  Surgeon: Iran Planas, MD;  Location: [place];  Service: Orthopedics;  Laterality: Right;   TONSILLECTOMY      Prior to Admission medications   Medication Sig Start Date End Date Taking? Authorizing Provider  acetaminophen-codeine (TYLENOL #3) 300-30 MG tablet Take 1 tablet by mouth every 4 (four) hours as needed for moderate pain.   Yes [provider]  cefTRIAXone (ROCEPHIN) IVPB Inject 2 g into the vein daily. Indication:  Septic Arthritis First Dose: Yes Last Day of Therapy:  03/25/21 Labs - Once weekly:  CBC/D and BMP, Labs - Every other week:  ESR and CRP Method of administration: IV Push Method of administration may be changed at the discretion of home infusion pharmacist based upon assessment of the patient and/or caregiver's ability to self-administer the medication ordered. 02/12/21 03/25/21 Yes Barb Merino, MD  daptomycin (CUBICIN) IVPB Inject 500 mg into the vein daily. Indication:  Septic Arthritis First Dose: Yes Last Day of Therapy:  03/25/21 Labs - Once weekly:  CBC/D, BMP, and CPK Labs - Every other week:  ESR and CRP Method of administration: IV Push Method of  administration may be changed at the discretion of home infusion pharmacist based upon assessment of the patient and/or caregiver's ability to self-administer the medication ordered. 02/12/21 03/25/21 Yes Barb Merino, MD  HYDROcodone-acetaminophen (NORCO/VICODIN) 5-325 MG tablet Take 1 tablet by mouth every 6 (six) hours as needed for moderate pain.   Yes [provider]  losartan (COZAAR) 100 MG tablet Take 100 mg by mouth daily. 12/14/18  Yes [provider]  meloxicam (MOBIC) 7.5 MG tablet Take 7.5 mg by mouth daily. 01/31/21  Yes [provider]  metFORMIN (GLUCOPHAGE-XR) 750 MG 24 hr tablet Take 750 mg by mouth every evening. 12/14/18  Yes [provider]  traMADol (ULTRAM) 50 MG tablet Take 50 mg by mouth every 6 (six) hours as needed for moderate pain. 01/31/21  Yes [provider]    Scheduled Meds:  atorvastatin  80 mg Oral Daily   Chlorhexidine Gluconate Cloth  6 each Topical Daily   heparin injection (subcutaneous)  5,000 Units Subcutaneous Q8H   insulin aspart  0-9 Units Subcutaneous TID WC   losartan  50 mg Oral Daily   metoprolol tartrate  12.5 mg Oral BID   nystatin  5 mL Oral QID   pantoprazole  40 mg Oral Daily   potassium chloride  20 mEq Oral BID   sodium chloride flush  3 mL Intravenous Q12H   sodium chloride flush  3 mL Intravenous Q12H   Infusions:  sodium chloride     sodium chloride 10 mL/hr at 02/22/21 0530   vancomycin 750 mg (02/24/21 1709)   PRN Meds: sodium chloride, acetaminophen **OR** acetaminophen, polyethylene glycol, sodium chloride flush, traMADol   Allergies as of 02/20/2021   (No Known Allergies)    Family History  Problem Relation Age of Onset   Hypertension Mother    Diabetes Father    Hypertension Father    Hypertension Sister    Diabetes Sister    Hypertension Sister    Diabetes Sister    Hypertension Sister    Diabetes Sister     Social History   Socioeconomic History   Marital status: Married    Spouse name: Not on file   Number of children: 1   Years of education: Not on file   Highest education level: Not on file  Occupational History   Not on file  Tobacco Use   Smoking status: Never   Smokeless tobacco: Never  Substance and Sexual Activity   Alcohol use: No   Drug use: No   Sexual activity: Not on file  Other Topics Concern   Not on file  Social History Narrative   Not on file   Social Determinants of Health   Financial Resource Strain: Not on file  Food Insecurity: Not on file  Transportation Needs: Not on file  Physical Activity: Not on file  Stress: Not on file  Social Connections: Not on file  Intimate Partner Violence: Not on file    REVIEW OF SYSTEMS: Constitutional: Fatigue, not profound, improved. ENT:  No nose bleeds Pulm: Orthopnea, improved. CV:  No  palpitations, no LE edema.  GU:  No hematuria, no frequency GI: Per HPI. Heme: Denies unusual or excessive bleeding or bruising. Transfusions: Just the 1 PRBC given on 11/4. Neuro:  No headaches, no peripheral tingling or numbness.  No seizures, no syncope. Derm:  No itching, no rash or sores.  Endocrine:  No sweats or chills.  No polyuria or dysuria Immunization: Has received COVID-19 vaccines.  PHYSICAL EXAM: Vital signs in last 24 hours: Vitals:   02/25/21 0310 02/25/21 1007  BP: 138/62 (!) 119/46  Pulse: 63 71  Resp: 17   Temp: 98.9 F (37.2 C)   SpO2: 97%    Wt Readings from Last 3 Encounters:  02/25/21 52.8 kg  02/07/21 62.2 kg  01/24/19 56.6 kg    General: Pleasant, well-appearing, comfortable. Head: No facial asymmetry or swelling.  Wearing a wig, not removed for the exam. Eyes: Conjunctiva slightly pale.  No scleral icterus.  EOMI Ears: Not hard of hearing. Nose: No discharge or congestion Mouth: Tongue midline.  Oral mucosa moist, pink, clear.  Full dentures in place, not removed for exam. Neck: Bilateral carotid bruits.  No JVD, no masses, no thyromegaly Lungs: Diminished breath sounds globally but clear.  No labored breathing.  No cough Heart: RRR.  On telemetry rate in the 50s, sinus rhythm.  Milf systolic murmer.  S1, S2 present Abdomen: Not tender.  Soft.  No organomegaly, masses, bruits, hernias.  Bowel sounds active..   Rectal: Did not perform DRE. Musc/Skeltl: Some swelling, tenderness and discoloration in right forearm into the hands. Extremities: No pedal/ankle edema. Neurologic: Alert.  Appropriate.  Oriented x3.  Moves all 4 limbs, strength not tested.  No tremors. Skin: Slight changes in the skin of the right forearm with some swelling, mild bruising. Nodes: No cervical adenopathy Psych: Calm, pleasant, cooperative.  Intake/Output from previous day: 11/06 0701 - 11/07 0700 In: 600 [P.O.:600] Out: 400 [Urine:400] Intake/Output this shift: No  intake/output data recorded.  LAB RESULTS: Recent Labs    02/23/21 0421 02/24/21 0500 02/25/21 0030  WBC 7.6 5.3 6.2  HGB 8.9* 8.4* 8.8*  HCT 27.3* 26.4* 28.0*  PLT 197 203 216   BMET Lab Results  Component Value Date   NA 138 02/25/2021   NA 139 02/24/2021   NA 136 02/23/2021   K 4.4 02/25/2021   K 3.9 02/24/2021   K 3.7 02/23/2021   CL 107 02/25/2021   CL 103 02/24/2021   CL 102 02/23/2021   CO2 24 02/25/2021   CO2 29 02/24/2021   CO2 26 02/23/2021   GLUCOSE 83 02/25/2021   GLUCOSE 80 02/24/2021   GLUCOSE 72 02/23/2021   BUN 9 02/25/2021   BUN 12 02/24/2021   BUN 12 02/23/2021   CREATININE 0.77 02/25/2021   CREATININE 0.86 02/24/2021   CREATININE 0.77 02/23/2021   CALCIUM 8.2 (L) 02/25/2021   CALCIUM 8.1 (L) 02/24/2021   CALCIUM 7.9 (L) 02/23/2021   LFT No results for input(s): PROT, ALBUMIN, AST, ALT, ALKPHOS, BILITOT, BILIDIR, IBILI in the last 72 hours. PT/INR Lab Results  Component Value Date   INR 1.4 (H) 02/20/2021   INR 1.01 04/14/2011   Hepatitis Panel No results for input(s): HEPBSAG, HCVAB, HEPAIGM, HEPBIGM in the last 72 hours. C-Diff No components found for: CDIFF Lipase  No results found for: LIPASE  Drugs of Abuse  No results found for: LABOPIA, COCAINSCRNUR, LABBENZ, AMPHETMU, THCU, LABBARB   RADIOLOGY STUDIES: VAS [place] LOWER EXTREMITY VENOUS (DVT)  Result Date: 02/23/2021    Summary: BILATERAL: - No evidence of deep vein thrombosis seen in the lower extremities, bilaterally. -No evidence of popliteal cyst, bilaterally.   Electronically signed by Monica Martinez MD on 02/23/2021 at 11:48:18 AM.    Final    VAS [place] UPPER EXTREMITY VENOUS DUPLEX  Result Date: 02/23/2021 Summary:  Right: No evidence of deep vein thrombosis in the upper extremity.  Left: No evidence  of deep vein thrombosis in the upper extremity. 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   Monica Martinez MD Electronically signed by Monica Martinez MD on 02/23/2021 at 11:48:40 AM.    Final       IMPRESSION:   Iron deficiency anemia.  FOBT positive.  Rigorous/excellent response to 1 PRBC.  No prior EGD or colonoscopy.  Notes mention dark stool a few days ago, which patient denies.  Has had dark stools when constipated on rare occasions in the past.  No previous EGD or colonoscopy.  No significant upper GI/lower GI symptoms.  Patient currently reluctant to undergo inpt EGD and colonoscopy and I did discuss the possibility that delaying procedures will delay diagnosis as to the cause of her anemia and that one cause could be a cancer.  I think she may be amenable to procedures but will let Dr. Bryan Lemma speak with her.  Empiric Protonix 40 mg/daily in place.  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     Myelitis/septic joint.  On prolonged course of Rocephin, daptomycin.     NIDDM.    PLAN:     Recommend colonoscopy, upper endoscopy.  Dr. Bryan Lemma will see patient and hopefully can persuade patient to undergo procedures.  Do not see any barriers to bowel prep   Azucena Freed  02/25/2021, 10:10 AM Phone [phone]

## 2021-02-25 NOTE — Plan of Care (Signed)
  Problem: Education: Goal: Knowledge of General Education information will improve Description: Including pain rating scale, medication(s)/side effects and non-pharmacologic comfort measures Outcome: Progressing   Problem: Health Behavior/Discharge Planning: Goal: Ability to manage health-related needs will improve Outcome: Progressing   Problem: Clinical Measurements: Goal: Ability to maintain clinical measurements within normal limits will improve Outcome: Progressing   Problem: Clinical Measurements: Goal: Will remain free from infection Outcome: Progressing   Problem: Clinical Measurements: Goal: Diagnostic test results will improve Outcome: Progressing   Problem: Clinical Measurements: Goal: Respiratory complications will improve Outcome: Progressing   Problem: Clinical Measurements: Goal: Cardiovascular complication will be avoided Outcome: Progressing   Problem: Activity: Goal: Risk for activity intolerance will decrease Outcome: Progressing   Problem: Nutrition: Goal: Adequate nutrition will be maintained Outcome: Progressing   Problem: Pain Managment: Goal: General experience of comfort will improve Outcome: Progressing   Problem: Safety: Goal: Ability to remain free from injury will improve Outcome: Progressing   Problem: Skin Integrity: Goal: Risk for impaired skin integrity will decrease Outcome: Progressing   Problem: Education: Goal: Ability to demonstrate management of disease process will improve Outcome: Progressing   Problem: Education: Goal: Ability to verbalize understanding of medication therapies will improve Outcome: Progressing

## 2021-02-25 NOTE — Progress Notes (Signed)
Pharmacy Antibiotic Note- Follow-Up  Christy Higgins is a 70 y.o. female admitted on 02/20/2021 with hand infection/septic arthritis. 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  Pharmacy has been consulted for vancomycin dosing. SCr 0.84 and CrCl ~ 52 ml/min.   Plan: Vancomycin 1000 mg x 1 then 750 mg every 24 hours Predicted AUC 465 with Scr 1 Monitor renal function and levels as appropriate   Height: 5' (152.4 cm) Weight: 52.8 kg (116 lb 6.4 oz) IBW/kg (Calculated) : 45.5  Temp (24hrs), Avg:99.1 F (37.3 C), Min:98.9 F (37.2 C), Max:99.2 F (37.3 C)  Recent Labs  Lab 02/20/21 1620 02/20/21 1713 02/21/21 0425 02/22/21 0313 02/22/21 0539 02/22/21 0800 02/22/21 1500 02/23/21 0421 02/24/21 0500 02/24/21 1836 02/25/21 0030  WBC 8.8  --  7.5 6.8   < > 5.8 5.6 7.6 5.3  --  6.2  CREATININE 0.80  --  0.84 0.81  --   --   --  0.77 0.86  --  0.77  LATICACIDVEN 1.1 1.3  --   --   --   --   --   --   --   --   --   VANCOPEAK  --   --   --   --   --   --   --   --   --  40  --    < > = values in this interval not displayed.     Estimated Creatinine Clearance: 47.7 mL/min (by C-G formula based on SCr of 0.77 mg/dL).    No Known Allergies  Plan: Vanco peak was drawn on 02/24/2021 at 1836 resulting as 40 mcg/mL. A trough level will be drawn on 02/25/2021 around 1600. An AUC evaluation will be conducted at this time and dose adjusted, if needed, based on goal AUC mentioned above.   Thank you for allowing pharmacy to be a part of this patient's care.  Greta Doom BS, PharmD, BCPS Clinical Pharmacist  02/25/2021 8:28 AM

## 2021-02-25 NOTE — Progress Notes (Signed)
Patient sutures removed with suture kit. Patient tolerated well. Gave prn tramadol for pain, patient refused the one time dose of morphine ordered. Site is clean, dry and intact.

## 2021-02-25 NOTE — Progress Notes (Addendum)
Pharmacy Antibiotic Note - Follow-Up  Christy Higgins is a 70 y.o. female admitted on 02/20/2021 with hand infection/septic arthritis. Pt was readmitted after recent discharge late last month for R wrist septic arthritis/osteomyelitis requiring serial I&D, cx negative. 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  Pharmacy was consulted for vancomycin dosing.   Pt rec'd vancomycin 1 gm IV X 1, followed by 750 mg IV Q 24 hrs. Vancomycin levels drawn after third dose of 750 mg IV Q 24 hr regimen (dose administered at 1709 PM, 11/6) are as follows: Vancomycin peak drawn at 1836 PM on 11/6: 40 mg/L Vancomycin trough drawn at 1630 PM on 11/7: 9 mg/L Vancomycin AUC calculated from the above levels is 504.2, which is within the goal vancomycin AUC range of 400-550.  WBC 6.2, Tmax 99.1 F; Scr 0.77, CrCl 47.7 ml/min (renal function stable)  Plan: Continue vancomycin 750 mg IV Q 24 hours Monitor WBC, temp, cultures, renal function, vancomycin levels  Height: 5' (152.4 cm) Weight: 52.8 kg (116 lb 6.4 oz) IBW/kg (Calculated) : 45.5  Temp (24hrs), Avg:98.9 F (37.2 C), Min:98.7 F (37.1 C), Max:99.1 F (37.3 C)  Recent Labs  Lab 02/20/21 1620 02/20/21 1713 02/21/21 0425 02/22/21 0313 02/22/21 0539 02/22/21 0800 02/22/21 1500 02/23/21 0421 02/24/21 0500 02/24/21 1836 02/25/21 0030 02/25/21 1630  WBC 8.8  --  7.5 6.8   < > 5.8 5.6 7.6 5.3  --  6.2  --   CREATININE 0.80  --  0.84 0.81  --   --   --  0.77 0.86  --  0.77  --   LATICACIDVEN 1.1 1.3  --   --   --   --   --   --   --   --   --   --   VANCOTROUGH  --   --   --   --   --   --   --   --   --   --   --  9*  VANCOPEAK  --   --   --   --   --   --   --   --   --  40  --   --    < > = values in this interval not displayed.     Estimated Creatinine Clearance: 47.7 mL/min (by C-G formula based on SCr of 0.77 mg/dL).    No Known  Allergies  Microbiology Results This Admission: 11/2 Bld cx X 2: NG/final 11/4 Bld cx X 2: NG at 3 days 11/2 COVID, flu A, flu B: negative 11/4 Parvovirus B19 IgM, IgG Ab: negative  Antimicrobials This Admission: Daptomycin X 1 11/2 Ceftriaxone 11/2 >> Vancomycin 11/3 >>  Thank you for allowing pharmacy to be a part of this patient's care.  Vicki Mallet, PharmD, BCPS, [facility] Clinical Pharmacist  02/25/2021 5:28 PM

## 2021-02-25 NOTE — Progress Notes (Signed)
Patient ID: Christy Higgins, female   DOB: Apr 29, 1950, 70 y.o.   MRN: [id number]   LOS: 4 days   Subjective: C/o mild wrist pain, not worsening.   Objective: Vital signs in last 24 hours: Temp:  [98.9 F (37.2 C)-99.2 F (37.3 C)] 98.9 F (37.2 C) (11/07 0310) Pulse Rate:  [54-63] 63 (11/07 0310) Resp:  [16-18] 17 (11/07 0310) BP: (94-138)/(51-62) 138/62 (11/07 0310) SpO2:  [93 %-98 %] 97 % (11/07 0310) Weight:  [52.8 kg] 52.8 kg (11/07 0310) Last BM Date: 02/24/21   Laboratory  CBC Recent Labs    02/24/21 0500 02/25/21 0030  WBC 5.3 6.2  HGB 8.4* 8.8*  HCT 26.4* 28.0*  PLT 203 216   BMET Recent Labs    02/24/21 0500 02/25/21 0030  NA 139 138  K 3.9 4.4  CL 103 107  CO2 29 24  GLUCOSE 80 83  BUN 12 9  CREATININE 0.86 0.77  CALCIUM 8.1* 8.2*     Physical Exam General appearance: alert and no distress RUE: Incisions C/D/I. 2+ radial pulse. M/R/U intact. AROM wrist about 30 degrees without pain. Motor intact but limited by pain.   Assessment/Plan: Right septic wrist -- Will d/c sutures. Does not appear another procedure is warranted at this time.    Freeman Caldron, PA-C Orthopedic Surgery [phone] 02/25/2021

## 2021-02-25 NOTE — Progress Notes (Signed)
[facility]  Date of Admission:  02/20/2021      Total days of antibiotics since 02/11/2021  Vancomycin 11/03 >> current  Ceftriaxone 10/24 >> current           ASSESSMENT: Christy Higgins is a 70 y.o. female readmitted with fevers, shortness of breath and cough after recently d/'d 10/26 for serial I&Ds on Rt wrist septic arthritis.   Fevers = resolved. ?drug fever with daptomycin. No growth on blood cultures. PICC appears normal. Possible tiny abscess to Rt wrist with ongoing tenosynovitis --> awaiting ortho input on.   Acute anemia = does not seem c/w primary hemolytic cause with negative coombs and only mildly elevated LDH . FOBT (+). GI consult pending.   Rt wrist Septic Arthritis = following with orthopedics to see if any further surgery warranted, though they appear pretty small. Currently planned for IV antibiotics through early December to continue treating this infection, though any further surgery would reset clock.   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 She has improved pretty quickly. No peripheral eosinophilia. Cardiac work up on hold with anemia work up pending.     PLAN: Continue IV vancomycin + ceftriaxone  Follow ortho comments on her MRI/R wrist.  Rest of care re: GIB/anemia and cardiac work up per other care teams.    Principal Problem:   Acute CHF (congestive heart failure) (HCC) Active Problems:   Septic arthritis (HCC)   Demand ischemia (HCC)   HTN (hypertension)   DM (diabetes mellitus) (HCC)   Acute respiratory failure with hypoxia (HCC)   CHF (congestive heart failure) (HCC)   NSTEMI (non-ST elevated myocardial infarction) (HCC)   Hand abscess   Fever   Eosinophilic pneumonia (HCC)    atorvastatin  80 mg Oral Daily   Chlorhexidine Gluconate Cloth  6 each Topical Daily   heparin injection (subcutaneous)  5,000 Units Subcutaneous Q8H   insulin aspart  0-9 Units  Subcutaneous TID WC   losartan  50 mg Oral Daily   metoprolol tartrate  12.5 mg Oral BID   nystatin  5 mL Oral QID   pantoprazole  40 mg Oral Daily   potassium chloride  20 mEq Oral BID   sodium chloride flush  3 mL Intravenous Q12H   sodium chloride flush  3 mL Intravenous Q12H    SUBJECTIVE: Up walking around. Had her brace taken down and someone is supposed to remove sutures in Rt wrist today. She has no fevers or other symptoms at this time. Breathing pretty well and off oxygen.    Review of Systems: Review of Systems  Constitutional:  Negative for chills and fever.  Respiratory:  Negative for cough and shortness of breath.   Cardiovascular:  Negative for chest pain.  Gastrointestinal:  Negative for abdominal pain, diarrhea and vomiting.  Genitourinary:  Negative for dysuria.  Musculoskeletal:  Positive for joint pain (Rt wrist swollen).  Neurological:  Negative for dizziness and headaches.    No Known Allergies   OBJECTIVE: Vitals:   02/24/21 1245 02/24/21 1931 02/25/21 0310 02/25/21 1007  BP: (!) 94/51 (!) 114/55 138/62 (!) 119/46  Pulse: (!) 57 (!) 54 63 71  Resp: 16 18 17    Temp: 99.2 F (37.3 C) 99.1 F (37.3 C) 98.9 F (37.2 C)   TempSrc: Oral Oral Oral   SpO2: 93% 98% 97%   Weight:   52.8 kg  Height:       Body mass index is 22.73 kg/m.   Physical Exam Vitals reviewed.  Constitutional:      Appearance: She is well-developed.     Comments: Seen walking in the halls and returned to side of bed. Appears well today. No distress.   HENT:     Mouth/Throat:     Mouth: No oral lesions.     Dentition: Normal dentition. No dental abscesses.     Pharynx: No oropharyngeal exudate.  Cardiovascular:     Rate and Rhythm: Normal rate and regular rhythm.     Heart sounds: Normal heart sounds.  Pulmonary:     Effort: Pulmonary effort is normal.     Breath sounds: Normal breath sounds.  Abdominal:     General: There is no distension.     Palpations: Abdomen  is soft.     Tenderness: There is no abdominal tenderness.  Musculoskeletal:     Comments: Right wrist with some swelling. Sutures intact on anterior wrist. No erythema, drainage or fluctuance.   Lymphadenopathy:     Cervical: No cervical adenopathy.  Skin:    General: Skin is warm and dry.     Findings: No rash.     Comments: LUE PICC line in place - C/D/I dressing  Neurological:     Mental Status: She is alert and oriented to person, place, and time.  Psychiatric:        Judgment: Judgment normal.     Comments: In good spirits today and engaged in care discussion    Lab Results Lab Results  Component Value Date   WBC 6.2 02/25/2021   HGB 8.8 (L) 02/25/2021   HCT 28.0 (L) 02/25/2021   MCV 87.2 02/25/2021   PLT 216 02/25/2021    Lab Results  Component Value Date   CREATININE 0.77 02/25/2021   BUN 9 02/25/2021   NA 138 02/25/2021   K 4.4 02/25/2021   CL 107 02/25/2021   CO2 24 02/25/2021    Lab Results  Component Value Date   ALT 28 02/21/2021   AST 43 (H) 02/21/2021   ALKPHOS 59 02/21/2021   BILITOT 1.0 02/21/2021     Microbiology: Recent Results (from the past 240 hour(s))  Blood Culture (routine x 2)     Status: None   Collection Time: 02/20/21  4:20 PM   Specimen: BLOOD  Result Value Ref Range Status   Specimen Description BLOOD PICC LINE  Final   Special Requests   Final    BOTTLES DRAWN AEROBIC AND ANAEROBIC Blood Culture adequate volume   Culture   Final    NO GROWTH 5 DAYS Performed at [facility], [address], [place], [place] [address]    Report Status 02/25/2021 FINAL  Final  Blood Culture (routine x 2)     Status: None   Collection Time: 02/20/21  5:13 PM   Specimen: BLOOD  Result Value Ref Range Status   Specimen Description BLOOD SITE NOT SPECIFIED  Final   Special Requests   Final    BOTTLES DRAWN AEROBIC AND ANAEROBIC Blood Culture adequate volume   Culture   Final    NO GROWTH 5 DAYS Performed at [facility], [address] [place], [place] [address]    Report Status 02/25/2021 FINAL  Final  Resp Panel by RT-PCR (Flu A&B, Covid) Nasopharyngeal Swab     Status: None   Collection Time: 02/20/21  8:55 PM   Specimen:  Nasopharyngeal Swab; Nasopharyngeal(NP) swabs in vial transport medium  Result Value Ref Range Status   SARS Coronavirus 2 by RT PCR NEGATIVE NEGATIVE Final    Comment: (NOTE) SARS-CoV-2 target nucleic acids are NOT DETECTED.  The SARS-CoV-2 RNA is generally detectable in upper respiratory specimens during the acute phase of infection. The lowest concentration of SARS-CoV-2 viral copies this assay can detect is 138 copies/mL. A negative result does not preclude SARS-Cov-2 infection and should not be used as the sole basis for treatment or other patient management decisions. A negative result may occur with  improper specimen collection/handling, submission of specimen other than nasopharyngeal swab, presence of viral mutation(s) within the areas targeted by this assay, and inadequate number of viral copies(<138 copies/mL). A negative result must be combined with clinical observations, patient history, and epidemiological information. The expected result is Negative.  Fact Sheet for Patients:  BloggerCourse.com  Fact Sheet for Healthcare Providers:  SeriousBroker.it  This test is no t yet approved or cleared by the [place] FDA and  has been authorized for detection and/or diagnosis of SARS-CoV-2 by FDA under an Emergency Use Authorization (EUA). This EUA will remain  in effect (meaning this test can be used) for the duration of the COVID-19 declaration under Section 564(b)(1) of the Act, 21 U.S.C.section 360bbb-3(b)(1), unless the authorization is terminated  or revoked sooner.       Influenza A by PCR NEGATIVE NEGATIVE Final   Influenza B by PCR NEGATIVE NEGATIVE Final    Comment: (NOTE) The Xpert Xpress SARS-CoV-2/FLU/RSV  plus assay is intended as an aid in the diagnosis of influenza from Nasopharyngeal swab specimens and should not be used as a sole basis for treatment. Nasal washings and aspirates are unacceptable for Xpert Xpress SARS-CoV-2/FLU/RSV testing.  Fact Sheet for Patients: BloggerCourse.com  Fact Sheet for Healthcare Providers: SeriousBroker.it  This test is not yet approved or cleared by the [place] FDA and has been authorized for detection and/or diagnosis of SARS-CoV-2 by FDA under an Emergency Use Authorization (EUA). This EUA will remain in effect (meaning this test can be used) for the duration of the COVID-19 declaration under Section 564(b)(1) of the Act, 21 U.S.C. section 360bbb-3(b)(1), unless the authorization is terminated or revoked.  Performed at [facility], [address] [place], [place] [address]   Culture, blood (Routine X 2) w Reflex to ID Panel     Status: None (Preliminary result)   Collection Time: 02/22/21 10:19 AM   Specimen: BLOOD LEFT HAND  Result Value Ref Range Status   Specimen Description BLOOD LEFT HAND  Final   Special Requests   Final    BOTTLES DRAWN AEROBIC AND ANAEROBIC Blood Culture results may not be optimal due to an inadequate volume of blood received in culture bottles   Culture   Final    NO GROWTH 3 DAYS Performed at [facility], [address] [place], [place] [address]    Report Status PENDING  Incomplete  Culture, blood (Routine X 2) w Reflex to ID Panel     Status: None (Preliminary result)   Collection Time: 02/22/21 10:26 AM   Specimen: BLOOD LEFT HAND  Result Value Ref Range Status   Specimen Description BLOOD LEFT HAND  Final   Special Requests   Final    BOTTLES DRAWN AEROBIC AND ANAEROBIC Blood Culture adequate volume   Culture   Final    NO GROWTH 3 DAYS Performed at [facility], [address], [place], [place] [address]    Report Status  PENDING  Incomplete    Rexene Alberts, MSN, NP-C [facility] Pager: [phone]  @TODAY @ 10:59 AM

## 2021-02-25 NOTE — Progress Notes (Signed)
Mobility Specialist Progress Note:   02/25/21 1000  Mobility  Activity Ambulated in hall  Level of Assistance Standby assist, set-up cues, supervision of patient - no hands on  Assistive Device None  Distance Ambulated (ft) 520 ft  Mobility Ambulated with assistance in hallway  Mobility Response Tolerated well  Mobility performed by Mobility specialist  $Mobility charge 1 Mobility   Pt received in bed willing to participate in mobility. No complaints of pain and asymptomatic. Pt returned to EOB with call bell in reach, all needs met, and RN present.    [facility] Health and safety inspector Phone [phone]

## 2021-02-25 NOTE — Progress Notes (Signed)
Patient complained of pain during shift change. Patient had been ordered a one time dose of Lidocaine 4% cream. Was applied to right arm around incision area. Patient still has difficulty with movement of right hand/wrist. Patient surgical wound sutures were removed today and wound looks clean and intact. Patient states they are doing well despite the acute pain in the right wrist/hand. Patient is able to ambulate in the room. Patient also stated they feel there appetite is adequate but believe that due to antibiotics isn't as sufficient right now. Patient tolerated all evening medications well and is resting. Patient family member at bedside and patient is resting comfortably watching television. Will continue to monitor.

## 2021-02-26 DIAGNOSIS — I248 Other forms of acute ischemic heart disease: Secondary | ICD-10-CM | POA: Diagnosis not present

## 2021-02-26 DIAGNOSIS — I509 Heart failure, unspecified: Secondary | ICD-10-CM | POA: Diagnosis not present

## 2021-02-26 DIAGNOSIS — D62 Acute posthemorrhagic anemia: Secondary | ICD-10-CM | POA: Diagnosis not present

## 2021-02-26 DIAGNOSIS — E119 Type 2 diabetes mellitus without complications: Secondary | ICD-10-CM | POA: Diagnosis not present

## 2021-02-26 DIAGNOSIS — L02519 Cutaneous abscess of unspecified hand: Secondary | ICD-10-CM | POA: Diagnosis not present

## 2021-02-26 DIAGNOSIS — M009 Pyogenic arthritis, unspecified: Secondary | ICD-10-CM | POA: Diagnosis not present

## 2021-02-26 DIAGNOSIS — I214 Non-ST elevation (NSTEMI) myocardial infarction: Secondary | ICD-10-CM | POA: Diagnosis not present

## 2021-02-26 LAB — CBC
HCT: 27.6 % — ABNORMAL LOW (ref 36.0–46.0)
Hemoglobin: 8.5 g/dL — ABNORMAL LOW (ref 12.0–15.0)
MCH: 27.6 pg (ref 26.0–34.0)
MCHC: 30.8 g/dL (ref 30.0–36.0)
MCV: 89.6 fL (ref 80.0–100.0)
Platelets: 229 10*3/uL (ref 150–400)
RBC: 3.08 MIL/uL — ABNORMAL LOW (ref 3.87–5.11)
RDW: 16.4 % — ABNORMAL HIGH (ref 11.5–15.5)
WBC: 6.8 10*3/uL (ref 4.0–10.5)
nRBC: 0 % (ref 0.0–0.2)

## 2021-02-26 LAB — TYPE AND SCREEN
ABO/RH(D): O POS
Antibody Screen: NEGATIVE
Unit division: 0
Unit division: 0

## 2021-02-26 LAB — BPAM RBC
Blood Product Expiration Date: 202212072359
Blood Product Expiration Date: 202212092359
ISSUE DATE / TIME: 202211041016
Unit Type and Rh: 5100
Unit Type and Rh: 5100

## 2021-02-26 LAB — BASIC METABOLIC PANEL
Anion gap: 7 (ref 5–15)
BUN: 11 mg/dL (ref 8–23)
CO2: 24 mmol/L (ref 22–32)
Calcium: 8.5 mg/dL — ABNORMAL LOW (ref 8.9–10.3)
Chloride: 108 mmol/L (ref 98–111)
Creatinine, Ser: 1.24 mg/dL — ABNORMAL HIGH (ref 0.44–1.00)
GFR, Estimated: 47 mL/min — ABNORMAL LOW (ref >60)
Glucose, Bld: 91 mg/dL (ref 70–99)
Potassium: 4.8 mmol/L (ref 3.5–5.1)
Sodium: 139 mmol/L (ref 135–145)

## 2021-02-26 LAB — GLUCOSE, CAPILLARY
Glucose-Capillary: 105 mg/dL — ABNORMAL HIGH (ref 70–99)
Glucose-Capillary: 92 mg/dL (ref 70–99)
Glucose-Capillary: 96 mg/dL (ref 70–99)
Glucose-Capillary: 105 mg/dL — ABNORMAL HIGH (ref 70–99)

## 2021-02-26 MED ORDER — LINEZOLID 600 MG PO TABS
600.0000 mg | ORAL_TABLET | Freq: Two times a day (BID) | ORAL | Status: DC
  Administered 2021-02-26 – 2021-02-28 (×4): 600 mg via ORAL
  Filled 2021-02-26 (×5): qty 1

## 2021-02-26 MED ORDER — SODIUM CHLORIDE 0.9 % IV SOLN: INTRAVENOUS | Status: DC

## 2021-02-26 MED ORDER — HYDROCERIN EX CREA
TOPICAL_CREAM | Freq: Two times a day (BID) | CUTANEOUS | Status: DC
  Filled 2021-02-26: qty 113

## 2021-02-26 MED ORDER — CEFTRIAXONE IV (FOR PTA / DISCHARGE USE ONLY): 2.0000 g | INTRAVENOUS | 0 refills | Status: AC

## 2021-02-26 NOTE — Progress Notes (Addendum)
PHARMACY CONSULT NOTE FOR:  OUTPATIENT  PARENTERAL ANTIBIOTIC THERAPY (OPAT)  Indication: Septic arthritis  Regimen: Ceftriaxone 2 gm IV Q 24 hours + Linezolid 600 mg PO BID then Doxycycline 100 mg BID  End date: 03/25/21 for ceftriaxone  Treat with linezolid through 11/21 then Doxycycline starting 11/22 through 12/5  IV antibiotic discharge orders are pended. To discharging provider:  please sign these orders via discharge navigator,  Select New Orders & click on the button choice - Manage This Unsigned Work.     Thank you for allowing pharmacy to be a part of this patient's care.  Sharin Mons, PharmD, BCPS, BCIDP Infectious Diseases Clinical Pharmacist Phone: [phone] 02/26/2021, 11:44 AM

## 2021-02-26 NOTE — Anesthesia Preprocedure Evaluation (Addendum)
Anesthesia Evaluation  Patient identified by MRN, date of birth, ID band Patient awake    Reviewed: Allergy & Precautions, NPO status , Patient's Chart, lab work & pertinent test results  Airway Mallampati: II  TM Distance: >3 FB Neck ROM: Full    Dental  (+) Edentulous Upper, Edentulous Lower   Pulmonary neg pulmonary ROS,    Pulmonary exam normal        Cardiovascular hypertension, Pt. on medications + Past MI and +CHF   Rhythm:Regular Rate:Normal     Neuro/Psych negative neurological ROS  negative psych ROS   GI/Hepatic Neg liver ROS, FOBT   Endo/Other  diabetes, Type 2, Oral Hypoglycemic Agents  Renal/GU   negative genitourinary   Musculoskeletal  (+) Arthritis , Osteoarthritis,    Abdominal Normal abdominal exam  (+)   Peds  Hematology  (+) anemia ,   Anesthesia Other Findings   Reproductive/Obstetrics                           Anesthesia Physical Anesthesia Plan  ASA: 2  Anesthesia Plan: MAC   Post-op Pain Management:    Induction: Intravenous  PONV Risk Score and Plan: 2 and Propofol infusion and Treatment may vary due to age or medical condition  Airway Management Planned: Simple Face Mask, Natural Airway and Nasal Cannula  Additional Equipment: None  Intra-op Plan:   Post-operative Plan:   Informed Consent: I have reviewed the patients History and Physical, chart, labs and discussed the procedure including the risks, benefits and alternatives for the proposed anesthesia with the patient or authorized representative who has indicated his/her understanding and acceptance.     Dental advisory given  Plan Discussed with: CRNA  Anesthesia Plan Comments: (Lab Results      Component                Value               Date                      WBC                      6.8                 02/26/2021                HGB                      8.5 (L)              02/26/2021                HCT                      27.6 (L)            02/26/2021                MCV                      89.6                02/26/2021                PLT                      229  02/26/2021           Lab Results      Component                Value               Date                      NA                       139                 02/26/2021                K                        4.8                 02/26/2021                CO2                      24                  02/26/2021                GLUCOSE                  91                  02/26/2021                BUN                      11                  02/26/2021                CREATININE               1.24 (H)            02/26/2021                CALCIUM                  8.5 (L)             02/26/2021                GFRNONAA                 47 (L)              02/26/2021          )       Anesthesia Quick Evaluation

## 2021-02-26 NOTE — Progress Notes (Signed)
Mobility Specialist Progress Note:   02/26/21 1021  Mobility  Activity Ambulated in hall  Level of Assistance Standby assist, set-up cues, supervision of patient - no hands on  Assistive Device None  Distance Ambulated (ft) 520 ft  Mobility Ambulated with assistance in hallway  Mobility Response Tolerated well  Mobility performed by Mobility specialist  $Mobility charge 1 Mobility   Pt received in bed willing to participate in mobility. No complaints of pain and asymptomatic throughout ambulation. Pt returned to EOB with call bell in reach and all needs met.   [facility] Health and safety inspector Phone [phone]

## 2021-02-26 NOTE — Progress Notes (Addendum)
Progress Note    Otilia Kareem  LDJ:[id number] DOB: 1950-12-24  DOA: 02/20/2021 PCP: Shanon Rosser, PA-C    Brief Narrative:     Medical records reviewed and are as summarized below:  Faydra Korman is an 70 y.o. female with medical history significant of cellulitis, diabetes, hypertension, hyperlipidemia presenting for ongoing shortness of breath and fever.   Stay complicated by worsening anemia suspected from GI bleed.  Patient is normally very active and works as a Secretary/administrator at [employer].  HF meds held due to hypotension.  Plan for EGD/colonoscopy in AM.   Assessment/Plan:   Principal Problem:   Septic arthritis ([place]) Active Problems:   Acute CHF (congestive heart failure) (HCC)   Demand ischemia (HCC)   HTN (hypertension)   DM (diabetes mellitus) ([place])   Acute respiratory failure with hypoxia (HCC)   CHF (congestive heart failure) (HCC)   NSTEMI (non-ST elevated myocardial infarction) ([place])   Hand abscess   Fever   Eosinophilic pneumonia (HCC)   Acute blood loss anemia   Acute CHF/NSTEMI/Acute Respiratory Failure with hypoxia:Type II MI, do not suspect ACS as the main etiology, suspect demand ischemia in view of elevated BNP and normal wall motion on recent echocardiogram, high-output heart failure. > Patient presenting with worsening shortness of breath and lower extremity edema.  More so lying down flat.  Found to be hypoxic to 86% on room air in the ED now saturating well on 2 L.  Intermittently tachypneic. > CT PE study in the ED was negative for PE did show evidence of edema and effusions consistent with heart failure picture. - Appreciate cardiology recommendations - Trend troponin - hold lasix and other BP meds for low BP - Strict I's/O, daily weights - Trend renal function and electrolytes   Cellulitis/Septic joint > Recently admitted from 10/19 until 10/26 due to cellulitis and septic joint treated with debridement and IV antibiotics, sent home with PICC line.    - report having a fever of 101.   - X-ray in the ED with only soft tissue swelling. -blood culture NGTD -ID Consult for abx -CRP decreased -MRI of wrist: Substantial improvement with interval resolution of the dominant fluid collections shown on the prior exam, and substantial reduction in the degree of subcutaneous edema and enhancement and generalized soft tissue edema and enhancement. However, there is still substantial synovitis in the distal radioulnar joint, radiocarpal joint, and midcarpal row region with patchy diffuse marrow edema and enhancement in the region, probably a combination of chronic underlying arthropathy and septic joint. In particular there is a dorsal effusion of the distal radioulnar joint measuring 0.7 cc with enhancing margins. 2. Small abscess dorsal to the proximal fourth and fifth metacarpals, but only about 0.1 cc in volume. -ortho consult appreciated- stitches removed, no plan for another procedure    Diabetes - SSI   Hypertension - held losartan  Thrush -nystatin swish and swallow  Hypokalemia/hypomagnesemia -replete  Anemia -unclear etiology -CBC showing <5 was inaccurate-- Hgb 6.5 -2018 was last normal -heme stool + -B12 >1000 -Fe mildly low but TIBC also low -s/p 2 units of PRBC -heparin d/c'd although no sign of bleeding -LDH slightly up, bilirubin was slightly elevated-- ? Hemolysis from abx -GI consult appreciated- plan for EGD/colonoscopy    Family Communication/Anticipated D/C date and plan/Code Status   DVT prophylaxis: scd Code Status: Full Code.  Disposition Plan: Status is: Inpatient  Remains inpatient appropriate because: needs further GI work up  Medical Consultants:   ID Cards GI ortho    Subjective:   Wrist tender but not too bad     Vitals:   02/25/21 2023 02/25/21 2052 02/26/21 0510 02/26/21 0944  BP: (!) 114/102  (!) 101/53 (!) 124/52  Pulse: (!) 58 (!) 59 (!) 50 (!) 51  Resp:  16  18   Temp: 98.6 F (37 C)  98.8 F (37.1 C)   TempSrc: Oral  Oral   SpO2:   100% 96%  Weight:   52.9 kg   Height:        Intake/Output Summary (Last 24 hours) at 02/26/2021 1021 Last data filed at 02/25/2021 2316 Gross per 24 hour  Intake 732.42 ml  Output --  Net 732.42 ml   Filed Weights   02/24/21 0338 02/25/21 0310 02/26/21 0510  Weight: 53 kg 52.8 kg 52.9 kg    Exam:  General: Appearance:    Well developed, well nourished female in no acute distress     Lungs:     respirations unlabored  Heart:    Bradycardic.   MS:   All extremities are intact.    Neurologic:   Awake, alert, oriented x 3. No apparent focal neurological           defect.             Data Reviewed:   I have personally reviewed following labs and imaging studies:  Labs: Labs show the following:   Basic Metabolic Panel: Recent Labs  Lab 02/20/21 2315 02/21/21 0425 02/22/21 0313 02/23/21 0421 02/24/21 0500 02/25/21 0030 02/26/21 0444  NA  --    < > 137 136 139 138 139  K  --    < > 3.8 3.7 3.9 4.4 4.8  CL  --    < > 103 102 103 107 108  CO2  --    < > $R'26 26 29 24 24  'Uw$ GLUCOSE  --    < > 86 72 80 83 91  BUN  --    < > $R'10 12 12 9 11  'hL$ CREATININE  --    < > 0.81 0.77 0.86 0.77 1.24*  CALCIUM  --    < > 8.0* 7.9* 8.1* 8.2* 8.5*  MG 1.2*  --   --  1.4*  --  1.6*  --    < > = values in this interval not displayed.   GFR Estimated Creatinine Clearance: 30.8 mL/min (A) (by C-G formula based on SCr of 1.24 mg/dL (H)). Liver Function Tests: Recent Labs  Lab 02/20/21 1620 02/21/21 0425  AST 33 43*  ALT 24 28  ALKPHOS 59 59  BILITOT 1.3* 1.0  PROT 7.3 6.7  ALBUMIN 2.6* 2.4*   No results for input(s): LIPASE, AMYLASE in the last 168 hours. No results for input(s): AMMONIA in the last 168 hours. Coagulation profile Recent Labs  Lab 02/20/21 1620  INR 1.4*    CBC: Recent Labs  Lab 02/20/21 1620 02/21/21 0425 02/22/21 1020 02/22/21 1500 02/23/21 0421 02/24/21 0500  02/25/21 0030 02/26/21 0444  WBC 8.8   < >  --  5.6 7.6 5.3 6.2 6.8  NEUTROABS 6.6  --  3.4  --   --   --   --   --   HGB 8.1*   < >  --  8.9* 8.9* 8.4* 8.8* 8.5*  HCT 25.2*   < >  --  26.7* 27.3* 26.4* 28.0* 27.6*  MCV 86.0   < >  --  85.6 85.6 87.1 87.2 89.6  PLT 313   < >  --  206 197 203 216 229   < > = values in this interval not displayed.   Cardiac Enzymes: Recent Labs  Lab 02/20/21 2315  CKTOTAL 80   BNP (last 3 results) No results for input(s): PROBNP in the last 8760 hours. CBG: Recent Labs  Lab 02/25/21 0614 02/25/21 1124 02/25/21 1650 02/25/21 2149 02/26/21 0609  GLUCAP 80 91 89 109* 105*   D-Dimer: No results for input(s): DDIMER in the last 72 hours. Hgb A1c: No results for input(s): HGBA1C in the last 72 hours. Lipid Profile: No results for input(s): CHOL, HDL, LDLCALC, TRIG, CHOLHDL, LDLDIRECT in the last 72 hours.  Thyroid function studies: No results for input(s): TSH, T4TOTAL, T3FREE, THYROIDAB in the last 72 hours.  Invalid input(s): FREET3  Anemia work up: No results for input(s): VITAMINB12, FOLATE, FERRITIN, TIBC, IRON, RETICCTPCT in the last 72 hours.  Sepsis Labs: Recent Labs  Lab 02/20/21 1620 02/20/21 1713 02/21/21 0425 02/23/21 0421 02/24/21 0500 02/25/21 0030 02/26/21 0444  WBC 8.8  --    < > 7.6 5.3 6.2 6.8  LATICACIDVEN 1.1 1.3  --   --   --   --   --    < > = values in this interval not displayed.    Microbiology Recent Results (from the past 240 hour(s))  Blood Culture (routine x 2)     Status: None   Collection Time: 02/20/21  4:20 PM   Specimen: BLOOD  Result Value Ref Range Status   Specimen Description BLOOD PICC LINE  Final   Special Requests   Final    BOTTLES DRAWN AEROBIC AND ANAEROBIC Blood Culture adequate volume   Culture   Final    NO GROWTH 5 DAYS Performed at [facility], [address] [place], [place] [address]    Report Status 02/25/2021 FINAL  Final  Blood Culture (routine x 2)      Status: None   Collection Time: 02/20/21  5:13 PM   Specimen: BLOOD  Result Value Ref Range Status   Specimen Description BLOOD SITE NOT SPECIFIED  Final   Special Requests   Final    BOTTLES DRAWN AEROBIC AND ANAEROBIC Blood Culture adequate volume   Culture   Final    NO GROWTH 5 DAYS Performed at [facility], [place] [address], [place], [place] [address]    Report Status 02/25/2021 FINAL  Final  Resp Panel by RT-PCR (Flu A&B, Covid) Nasopharyngeal Swab     Status: None   Collection Time: 02/20/21  8:55 PM   Specimen: Nasopharyngeal Swab; Nasopharyngeal(NP) swabs in vial transport medium  Result Value Ref Range Status   SARS Coronavirus 2 by RT PCR NEGATIVE NEGATIVE Final    Comment: (NOTE) SARS-CoV-2 target nucleic acids are NOT DETECTED.  The SARS-CoV-2 RNA is generally detectable in upper respiratory specimens during the acute phase of infection. The lowest concentration of SARS-CoV-2 viral copies this assay can detect is 138 copies/mL. A negative result does not preclude SARS-Cov-2 infection and should not be used as the sole basis for treatment or other patient management decisions. A negative result may occur with  improper specimen collection/handling, submission of specimen other than nasopharyngeal swab, presence of viral mutation(s) within the areas targeted by this assay, and inadequate number of viral copies(<138 copies/mL). A negative result must be combined with clinical observations, patient history, and epidemiological information. The expected result is  Negative.  Fact Sheet for Patients:  EntrepreneurPulse.com.au  Fact Sheet for Healthcare Providers:  IncredibleEmployment.be  This test is no t yet approved or cleared by the [place] FDA and  has been authorized for detection and/or diagnosis of SARS-CoV-2 by FDA under an Emergency Use Authorization (EUA). This EUA will remain  in effect (meaning this test can be  used) for the duration of the COVID-19 declaration under Section 564(b)(1) of the Act, 21 U.S.C.section 360bbb-3(b)(1), unless the authorization is terminated  or revoked sooner.       Influenza A by PCR NEGATIVE NEGATIVE Final   Influenza B by PCR NEGATIVE NEGATIVE Final    Comment: (NOTE) The Xpert Xpress SARS-CoV-2/FLU/RSV plus assay is intended as an aid in the diagnosis of influenza from Nasopharyngeal swab specimens and should not be used as a sole basis for treatment. Nasal washings and aspirates are unacceptable for Xpert Xpress SARS-CoV-2/FLU/RSV testing.  Fact Sheet for Patients: EntrepreneurPulse.com.au  Fact Sheet for Healthcare Providers: IncredibleEmployment.be  This test is not yet approved or cleared by the [place] FDA and has been authorized for detection and/or diagnosis of SARS-CoV-2 by FDA under an Emergency Use Authorization (EUA). This EUA will remain in effect (meaning this test can be used) for the duration of the COVID-19 declaration under Section 564(b)(1) of the Act, 21 U.S.C. section 360bbb-3(b)(1), unless the authorization is terminated or revoked.  Performed at [facility], [place] [address], [place], [place] [address]   Culture, blood (Routine X 2) w Reflex to ID Panel     Status: None (Preliminary result)   Collection Time: 02/22/21 10:19 AM   Specimen: BLOOD LEFT HAND  Result Value Ref Range Status   Specimen Description BLOOD LEFT HAND  Final   Special Requests   Final    BOTTLES DRAWN AEROBIC AND ANAEROBIC Blood Culture results may not be optimal due to an inadequate volume of blood received in culture bottles   Culture   Final    NO GROWTH 4 DAYS Performed at [facility], [place] [address], [place], [place] [address]    Report Status PENDING  Incomplete  Culture, blood (Routine X 2) w Reflex to ID Panel     Status: None (Preliminary result)   Collection Time: 02/22/21 10:26 AM    Specimen: BLOOD LEFT HAND  Result Value Ref Range Status   Specimen Description BLOOD LEFT HAND  Final   Special Requests   Final    BOTTLES DRAWN AEROBIC AND ANAEROBIC Blood Culture adequate volume   Culture   Final    NO GROWTH 4 DAYS Performed at [facility], [place] [address], [place], [place] [address]    Report Status PENDING  Incomplete    Procedures and diagnostic studies:  No results found.  Medications:    atorvastatin  80 mg Oral Daily   bisacodyl  10 mg Oral BID   Chlorhexidine Gluconate Cloth  6 each Topical Daily   heparin injection (subcutaneous)  5,000 Units Subcutaneous Q8H   insulin aspart  0-9 Units Subcutaneous TID WC   metoprolol tartrate  12.5 mg Oral BID    morphine injection  1 mg Intravenous Once   nystatin  5 mL Oral QID   pantoprazole  40 mg Oral Daily   peg 3350 powder  0.5 kit Oral Once   And   peg 3350 powder  0.5 kit Oral Once   potassium chloride  20 mEq Oral BID   sodium chloride  flush  3 mL Intravenous Q12H   sodium chloride flush  3 mL Intravenous Q12H   Continuous Infusions:  sodium chloride     sodium chloride Stopped (02/25/21 2251)   cefTRIAXone (ROCEPHIN)  IV Stopped (02/25/21 2137)   vancomycin Stopped (02/25/21 1832)     LOS: 5 days   Geradine Girt  [facility]   How to contact the [facility] Attending or Consulting provider [place] or covering provider during after hours [employer], for this patient?  Check the care team in [facility] and look for a) attending/consulting [facility] provider listed and b) the [facility] team listed Log into [email or web address] and use [place] universal password to access. If you do not have the password, please contact the hospital operator. Locate the [facility] provider you are looking for under [facility] and page to a number that you can be directly reached. If you still have difficulty reaching the provider, please page the [facility] (Director on Call) for the Hospitalists listed on amion for  assistance.  02/26/2021, 10:21 AM

## 2021-02-26 NOTE — Plan of Care (Signed)
  Problem: Education: Goal: Knowledge of General Education information will improve Description: Including pain rating scale, medication(s)/side effects and non-pharmacologic comfort measures Outcome: Progressing   Problem: Health Behavior/Discharge Planning: Goal: Ability to manage health-related needs will improve Outcome: Progressing   Problem: Clinical Measurements: Goal: Ability to maintain clinical measurements within normal limits will improve Outcome: Progressing   Problem: Activity: Goal: Risk for activity intolerance will decrease Outcome: Progressing   Problem: Nutrition: Goal: Adequate nutrition will be maintained Outcome: Progressing   Problem: Pain Managment: Goal: General experience of comfort will improve Outcome: Progressing   Problem: Skin Integrity: Goal: Risk for impaired skin integrity will decrease Outcome: Progressing   

## 2021-02-26 NOTE — Progress Notes (Addendum)
Colonoscopy, EGD set up for tomorrow 11/9 at 9 AM.  This for evaluation FOBT positive anemia.  See orders for split dose bowel prep.  On clear liquids.  Patient not reexamined.  Vital signs with soft blood pressures in low 100-120s/50s.  Heart rate in the 50s.  Excellent room air sats.  She looks well.  Discussed benefits ( finding source for anemia, FOBT+, screening for colon cancer), risks (bleeding, bowel perf, respiratory distress) w pt.  She is somewhat anxious but feels re-assured and willing to proceed.    Hgb 8.5, stable.  Jennye Moccasin, PA-C

## 2021-02-26 NOTE — Progress Notes (Addendum)
Patient is unable to sign the inform consent due to surgical incision on the R wrist and the patient writes with her R hand. Patient is alert and oriented x 4 and consents to the procedure. Reino Kent RN and Janece Canterbury RN have witness the verbal consent to sign.  Inform consent has been placed in the patient's chart.

## 2021-02-26 NOTE — Progress Notes (Signed)
[facility] for Infectious Disease  Date of Admission:  02/20/2021           Reason for visit: Follow up on septic arthritis  Current antibiotics: Ceftriaxone 10/24--present Vancomycin 11/3--present    ASSESSMENT:    70 y.o. female admitted with:  Right wrist septic arthritis: Status post serial I&D on 10/19, 10/21, and 10/24 with negative cultures (few gram-positive cocci were noted on gram stain 10/19; received Bactrim prior to surgery).  Discharged 10/26 on daptomycin and ceftriaxone for planned 6-week course.  However, readmitted 11/2 with fevers, shortness of breath, cough.  ?  Possible drug fever with daptomycin.  Shortness of breath and cough attributed to high-output CHF with CTA noting bilateral perihilar and suprahilar pulmonary infiltrates and moderate bilateral pleural effusions consistent with cardiogenic failure.  Daptomycin induced eosinophilic pneumonitis felt to be less likely given her rapid improvement and lack of peripheral eosinophilia, however, diagnosis not entirely excluded.  Repeat MRI of the wrist showed a tiny fluid collection, however, Ortho has no plans for further procedures and her sutures were removed yesterday. Acute anemia: Planning for EGD and colonoscopy with GI tomorrow. Acute kidney injury: Creatinine elevated today with creatinine clearance 30.8. Diabetes: Recent hemoglobin A1c 6.4. Acute CHF: See #1.  RECOMMENDATIONS:    Continue ceftriaxone Given worsening creatinine, will stop vancomycin.  Anticipate her creatinine will continue to rise in the next day or 2 especially given plans for bowel prep Will change to linezolid through 11/21 then doxycycline starting 11/22 through 12/5 See OP AT note today Will continue to follow   Principal Problem:   Septic arthritis ([place]) Active Problems:   Acute CHF (congestive heart failure) ([place])   Demand ischemia (HCC)   HTN (hypertension)   DM (diabetes mellitus) ([place])   Acute respiratory  failure with hypoxia (HCC)   CHF (congestive heart failure) (HCC)   NSTEMI (non-ST elevated myocardial infarction) ([place])   Hand abscess   Fever   Eosinophilic pneumonia (HCC)   Acute blood loss anemia    MEDICATIONS:    Scheduled Meds:  atorvastatin  80 mg Oral Daily   bisacodyl  10 mg Oral BID   Chlorhexidine Gluconate Cloth  6 each Topical Daily   heparin injection (subcutaneous)  5,000 Units Subcutaneous Q8H   insulin aspart  0-9 Units Subcutaneous TID WC   linezolid  600 mg Oral Q12H   metoprolol tartrate  12.5 mg Oral BID    morphine injection  1 mg Intravenous Once   nystatin  5 mL Oral QID   pantoprazole  40 mg Oral Daily   peg 3350 powder  0.5 kit Oral Once   And   peg 3350 powder  0.5 kit Oral Once   potassium chloride  20 mEq Oral BID   sodium chloride flush  3 mL Intravenous Q12H   sodium chloride flush  3 mL Intravenous Q12H   Continuous Infusions:  sodium chloride     sodium chloride Stopped (02/25/21 2251)   cefTRIAXone (ROCEPHIN)  IV Stopped (02/25/21 2137)   PRN Meds:.sodium chloride, acetaminophen **OR** acetaminophen, polyethylene glycol, sodium chloride flush, traMADol  SUBJECTIVE:   24 hour events:  No acute events noted overnight Planning for EGD/colonoscopy tomorrow Afebrile, T-max 98.8 WBC 6.8, hemoglobin stable 8.5 Creatinine bumped to 1.24.  Creatinine clearance 30.8 No new cultures No new imaging Sutures removed, no further procedures per Ortho  Patient states that she feels like she has some improved range of motion after suture removal.  Tolerating antibiotics.  No fevers, chills.  Planning for endoscopy tomorrow.  Review of Systems  All other systems reviewed and are negative.    OBJECTIVE:   Blood pressure 104/70, pulse (!) 52, temperature 98.5 F (36.9 C), temperature source Oral, resp. rate 17, height 5' (1.524 m), weight 52.9 kg, SpO2 100 %. Body mass index is 22.79 kg/m.  Physical Exam Constitutional:      General: She  is not in acute distress.    Appearance: Normal appearance.  HENT:     Head: Normocephalic and atraumatic.  Eyes:     Extraocular Movements: Extraocular movements intact.     Conjunctiva/sclera: Conjunctivae normal.  Pulmonary:     Effort: Pulmonary effort is normal. No respiratory distress.  Abdominal:     General: There is no distension.     Palpations: Abdomen is soft.  Musculoskeletal:        General: Swelling and signs of injury present.     Cervical back: Normal range of motion and neck supple.     Comments: Right wrist sutures removed, incision healing without dehiscence.  No drainage.  Skin:    General: Skin is warm and dry.  Neurological:     General: No focal deficit present.     Mental Status: She is alert and oriented to person, place, and time.  Psychiatric:        Mood and Affect: Mood normal.        Behavior: Behavior normal.     Lab Results: Lab Results  Component Value Date   WBC 6.8 02/26/2021   HGB 8.5 (L) 02/26/2021   HCT 27.6 (L) 02/26/2021   MCV 89.6 02/26/2021   PLT 229 02/26/2021    Lab Results  Component Value Date   NA 139 02/26/2021   K 4.8 02/26/2021   CO2 24 02/26/2021   GLUCOSE 91 02/26/2021   BUN 11 02/26/2021   CREATININE 1.24 (H) 02/26/2021   CALCIUM 8.5 (L) 02/26/2021   GFRNONAA 47 (L) 02/26/2021   GFRAA >60 08/04/2016    Lab Results  Component Value Date   ALT 28 02/21/2021   AST 43 (H) 02/21/2021   ALKPHOS 59 02/21/2021   BILITOT 1.0 02/21/2021       Component Value Date/Time   CRP 12.3 (H) 02/21/2021 1316       Component Value Date/Time   ESRSEDRATE 26 (H) 03/06/2016 0816     I have reviewed the micro and lab results in Epic.  Imaging: No results found.   Imaging independently reviewed in Epic.    Raynelle [facility] for Infectious Disease [facility] [phone] pager 02/26/2021, 11:47 AM  I spent greater than 35 minutes with the patient including greater than 50% of time  in face to face counsel of the patient and in coordination of their care.

## 2021-02-26 NOTE — Progress Notes (Signed)
Movi Prep has initiated at 1743. Patient is made aware that she has to drink the entire bottle in the hour as tolerated. Patient educated on the importance of the medication prior to her colonoscopy procedure tomorrow morning.

## 2021-02-26 NOTE — Progress Notes (Signed)
Patient has questions about EGD/colonoscopy procedure before the patient signs the informed consent. Paged [facility] and made aware. [facility] will round on the patient soon.

## 2021-02-27 ENCOUNTER — Encounter (HOSPITAL_COMMUNITY): Payer: Self-pay | Admitting: Internal Medicine

## 2021-02-27 ENCOUNTER — Inpatient Hospital Stay (HOSPITAL_COMMUNITY): Payer: BC Managed Care – PPO | Admitting: Anesthesiology

## 2021-02-27 ENCOUNTER — Encounter (HOSPITAL_COMMUNITY)
Admission: EM | Disposition: A | Discharge status: Home Health | Payer: Self-pay | Source: Home / Self Care | Attending: Internal Medicine

## 2021-02-27 DIAGNOSIS — R195 Other fecal abnormalities: Secondary | ICD-10-CM

## 2021-02-27 DIAGNOSIS — K5730 Diverticulosis of large intestine without perforation or abscess without bleeding: Secondary | ICD-10-CM

## 2021-02-27 DIAGNOSIS — M009 Pyogenic arthritis, unspecified: Secondary | ICD-10-CM | POA: Diagnosis not present

## 2021-02-27 DIAGNOSIS — D649 Anemia, unspecified: Secondary | ICD-10-CM

## 2021-02-27 DIAGNOSIS — K2970 Gastritis, unspecified, without bleeding: Secondary | ICD-10-CM

## 2021-02-27 HISTORY — PX: ESOPHAGOGASTRODUODENOSCOPY: SHX5428

## 2021-02-27 HISTORY — PX: COLONOSCOPY: SHX5424

## 2021-02-27 HISTORY — PX: BIOPSY: SHX5522

## 2021-02-27 LAB — BASIC METABOLIC PANEL
Anion gap: 10 (ref 5–15)
BUN: 9 mg/dL (ref 8–23)
CO2: 18 mmol/L — ABNORMAL LOW (ref 22–32)
Calcium: 8.8 mg/dL — ABNORMAL LOW (ref 8.9–10.3)
Chloride: 115 mmol/L — ABNORMAL HIGH (ref 98–111)
Creatinine, Ser: 1.06 mg/dL — ABNORMAL HIGH (ref 0.44–1.00)
GFR, Estimated: 57 mL/min — ABNORMAL LOW (ref >60)
Glucose, Bld: 88 mg/dL (ref 70–99)
Potassium: 4.9 mmol/L (ref 3.5–5.1)
Sodium: 143 mmol/L (ref 135–145)

## 2021-02-27 LAB — CULTURE, BLOOD (ROUTINE X 2)
Culture: NO GROWTH
Culture: NO GROWTH
Special Requests: ADEQUATE

## 2021-02-27 LAB — CBC
HCT: 28.6 % — ABNORMAL LOW (ref 36.0–46.0)
Hemoglobin: 8.7 g/dL — ABNORMAL LOW (ref 12.0–15.0)
MCH: 27.6 pg (ref 26.0–34.0)
MCHC: 30.4 g/dL (ref 30.0–36.0)
MCV: 90.8 fL (ref 80.0–100.0)
Platelets: 255 10*3/uL (ref 150–400)
RBC: 3.15 MIL/uL — ABNORMAL LOW (ref 3.87–5.11)
RDW: 16.9 % — ABNORMAL HIGH (ref 11.5–15.5)
WBC: 7.2 10*3/uL (ref 4.0–10.5)
nRBC: 0 % (ref 0.0–0.2)

## 2021-02-27 LAB — GLUCOSE, CAPILLARY
Glucose-Capillary: 97 mg/dL (ref 70–99)
Glucose-Capillary: 72 mg/dL (ref 70–99)
Glucose-Capillary: 103 mg/dL — ABNORMAL HIGH (ref 70–99)
Glucose-Capillary: 110 mg/dL — ABNORMAL HIGH (ref 70–99)

## 2021-02-27 LAB — MAGNESIUM: Magnesium: 1.8 mg/dL (ref 1.7–2.4)

## 2021-02-27 SURGERY — EGD (ESOPHAGOGASTRODUODENOSCOPY)
Anesthesia: Monitor Anesthesia Care | Laterality: Left

## 2021-02-27 MED ORDER — PROPOFOL 500 MG/50ML IV EMUL
INTRAVENOUS | Status: DC | PRN
  Administered 2021-02-27: 75 ug/kg/min via INTRAVENOUS

## 2021-02-27 MED ORDER — LACTATED RINGERS IV SOLN
INTRAVENOUS | Status: AC | PRN
  Administered 2021-02-27: 1000 mL via INTRAVENOUS

## 2021-02-27 MED ORDER — SODIUM CHLORIDE 0.9 % IV SOLN: INTRAVENOUS | Status: DC | PRN

## 2021-02-27 MED ORDER — PANTOPRAZOLE SODIUM 40 MG PO TBEC
40.0000 mg | DELAYED_RELEASE_TABLET | Freq: Two times a day (BID) | ORAL | Status: DC
  Administered 2021-02-27 – 2021-02-28 (×2): 40 mg via ORAL
  Filled 2021-02-27 (×2): qty 1

## 2021-02-27 MED ORDER — LIDOCAINE 2% (20 MG/ML) 5 ML SYRINGE
INTRAMUSCULAR | Status: DC | PRN
  Administered 2021-02-27: 50 mg via INTRAVENOUS

## 2021-02-27 MED ORDER — PROPOFOL 10 MG/ML IV BOLUS
INTRAVENOUS | Status: DC | PRN
  Administered 2021-02-27: 80 mg via INTRAVENOUS

## 2021-02-27 NOTE — Progress Notes (Signed)
PROGRESS NOTE    Christy Higgins  DQQ:[id number] DOB: 15-Jan-1951 DOA: 02/20/2021 PCP: Lindaann Pascal, PA-C   Brief Narrative:  Christy Higgins is an 70 y.o. female with medical history significant of cellulitis, diabetes, hypertension, hyperlipidemia presenting for ongoing shortness of breath and fever.   Stay complicated by worsening anemia suspected from GI bleed.  Patient is normally very active and works as a Advertising copywriter at [employer].  HF meds held due to hypotension.  Plan for EGD/colonoscopy in AM.  Assessment & Plan:   Principal Problem:   Septic arthritis (HCC) Active Problems:   Acute CHF (congestive heart failure) (HCC)   Demand ischemia (HCC)   HTN (hypertension)   DM (diabetes mellitus) (HCC)   Acute respiratory failure with hypoxia (HCC)   CHF (congestive heart failure) (HCC)   NSTEMI (non-ST elevated myocardial infarction) (HCC)   Hand abscess   Fever   Eosinophilic pneumonia (HCC)   Acute blood loss anemia   Diverticulosis of colon without hemorrhage   Heme positive stool   Normocytic anemia   Gastritis and gastroduodenitis  Acute Respiratory Failure with hypoxia secondary to acute on chronic diastolic congestive heart failure/elevated troponin: Troponin 394 > 498 > 527 > 823 > 413 > 419, suspect demand ischemia in view of elevated BNP and normal wall motion on recent echocardiogram, high-output heart failure. CT PE study in the ED was negative for PE, however it did show evidence of edema and effusions consistent with heart failure picture.  Started on diuretics, they are currently on hold due to rising creatinine.  Cardiology saw several days ago, recommended resuming aspirin once her GI issues are resolved and they will plan for outpatient ischemic work-up.  Patient is doing well regarding her respiratory issue, currently no shortness of breath and she is saturating 100% on room air.  AKI: Creatinine slightly better but still elevated than baseline.  Monitor and repeat labs in the  morning.   Cellulitis/Septic joint Status post serial I&D on 10/19, 10/21, and 10/24 with negative cultures (few gram-positive cocci were noted on gram stain 10/19; received Bactrim prior to surgery).  Discharged 10/26 on daptomycin and ceftriaxone for planned 6-week course.  However, readmitted 11/2 with fevers, shortness of breath, cough.  ?  Possible drug fever with daptomycin.  Daptomycin induced eosinophilic pneumonitis felt to be less likely given her rapid improvement and lack of peripheral eosinophilia, however, diagnosis not entirely excluded.  Repeat MRI of the wrist showed a tiny fluid collection, however, Ortho has no plans for further procedures and her sutures were removed 02/25/2021.  Due to worsening creatinine, vancomycin was stopped, Rocephin continued and linezolid added through 03/11/2021 and the plan is to resume doxycycline from 11/22 through 03/25/2021. - X-ray in the ED with only soft tissue swelling. -blood culture NGTD   Diabetes mellitus type II: Blood sugar controlled.  Continue SSI.   Hypertension -Very well controlled.  Continue to hold losartan.    Oral candidiasis: Continue nystatin swish and swallow.   Hypokalemia/hypomagnesemia Resolved.  Acute blood loss anemia/gastritis   Anemia acute blood loss anemia/gastritis/nonbleeding diverticulosis: Patient's hemoglobin dropped to 6.5 on 02/22/2021, FOBT positive.  Iron low and TIBC also low.  She received 2 units of PRBC.  Hemoglobin has remained stable since then.  Heparin was discontinued.  GI consulted.  She underwent EGD and colonoscopy 02/27/2021 and was found to have mild gastritis, nonbleeding diverticulosis.  No active bleeding.  GI recommended continuing PPI twice daily for 6 weeks followed by once daily.  DVT  prophylaxis: heparin injection 5,000 Units Start: 02/23/21 1400   Code Status: Full Code  Family Communication:  None present at bedside.  Plan of care discussed with patient in length and he verbalized  understanding and agreed with it.  Status is: Inpatient  Remains inpatient appropriate because: Needs further monitoring and management.  Estimated body mass index is 22.15 kg/m as calculated from the following:   Height as of this encounter: 5' (1.524 m).   Weight as of this encounter: 51.4 kg.     Nutritional Assessment: Body mass index is 22.15 kg/m.Marland Kitchen Seen by dietician.  I agree with the assessment and plan as outlined below: Nutrition Status:  Skin Assessment: I have examined the patient's skin and I agree with the wound assessment as performed by the wound care RN as outlined below:    Consultants:  GI ID Cardiology  Procedures:  EGD/colonoscopy  Antimicrobials:  Anti-infectives (From admission, onward)    Start     Dose/Rate Route Frequency Ordered Stop   02/26/21 2200  linezolid (ZYVOX) tablet 600 mg        600 mg Oral Every 12 hours 02/26/21 1140     02/26/21 0000  cefTRIAXone (ROCEPHIN) IVPB        2 g Intravenous Every 24 hours 02/26/21 1147 03/25/21 2359   02/25/21 2200  cefTRIAXone (ROCEPHIN) 2 g in sodium chloride 0.9 % 100 mL IVPB        2 g 200 mL/hr over 30 Minutes Intravenous Every 24 hours 02/25/21 1202     02/22/21 1700  vancomycin (VANCOREADY) IVPB 750 mg/150 mL  Status:  Discontinued        750 mg 150 mL/hr over 60 Minutes Intravenous Every 24 hours 02/21/21 1613 02/26/21 1140   02/21/21 1700  vancomycin (VANCOCIN) IVPB 1000 mg/200 mL premix        1,000 mg 200 mL/hr over 60 Minutes Intravenous  Once 02/21/21 1613 02/21/21 1804   02/20/21 2100  cefTRIAXone (ROCEPHIN) 2 g in sodium chloride 0.9 % 100 mL IVPB        2 g 200 mL/hr over 30 Minutes Intravenous Every 24 hours 02/20/21 2057 02/24/21 2252   02/20/21 2100  DAPTOmycin (CUBICIN) 500 mg in sodium chloride 0.9 % IVPB  Status:  Discontinued        500 mg 120 mL/hr over 30 Minutes Intravenous Daily 02/20/21 2057 02/21/21 1609          Subjective: Patient seen and examined after the  procedure.  She was doing well.  She had no complaints other than wrist pain.  She was fully alert and oriented.  She is looking forward to going home in near future.  Objective: Vitals:   02/27/21 0942 02/27/21 0951 02/27/21 0953 02/27/21 1013  BP: (!) 138/59 (!) 157/66 (!) 145/55 127/61  Pulse: 76 66 (!) 55 (!) 54  Resp: (!) 28 (!) 22 (!) 28 14  Temp:    99 F (37.2 C)  TempSrc:    Oral  SpO2: 100% 100% 100% 100%  Weight:      Height:        Intake/Output Summary (Last 24 hours) at 02/27/2021 1247 Last data filed at 02/27/2021 0916 Gross per 24 hour  Intake 1598.54 ml  Output --  Net 1598.54 ml   Filed Weights   02/25/21 0310 02/26/21 0510 02/27/21 0426  Weight: 52.8 kg 52.9 kg 51.4 kg    Examination:  General exam: Appears calm and comfortable  Respiratory system: Clear  to auscultation. Respiratory effort normal. Cardiovascular system: S1 & S2 heard, RRR. No JVD, murmurs, rubs, gallops or clicks. No pedal edema. Gastrointestinal system: Abdomen is nondistended, soft and nontender. No organomegaly or masses felt. Normal bowel sounds heard. Central nervous system: Alert and oriented. No focal neurological deficits. Extremities: Symmetric 5 x 5 power. Skin: No rashes, lesions or ulcers Psychiatry: Judgement and insight appear normal. Mood & affect appropriate.    Data Reviewed: I have personally reviewed following labs and imaging studies  CBC: Recent Labs  Lab 02/20/21 1620 02/21/21 0425 02/22/21 1020 02/22/21 1500 02/23/21 0421 02/24/21 0500 02/25/21 0030 02/26/21 0444 02/27/21 0519  WBC 8.8   < >  --    < > 7.6 5.3 6.2 6.8 7.2  NEUTROABS 6.6  --  3.4  --   --   --   --   --   --   HGB 8.1*   < >  --    < > 8.9* 8.4* 8.8* 8.5* 8.7*  HCT 25.2*   < >  --    < > 27.3* 26.4* 28.0* 27.6* 28.6*  MCV 86.0   < >  --    < > 85.6 87.1 87.2 89.6 90.8  PLT 313   < >  --    < > 197 203 216 229 255   < > = values in this interval not displayed.   Basic Metabolic  Panel: Recent Labs  Lab 02/20/21 2315 02/21/21 0425 02/23/21 0421 02/24/21 0500 02/25/21 0030 02/26/21 0444 02/27/21 0519  NA  --    < > 136 139 138 139 143  K  --    < > 3.7 3.9 4.4 4.8 4.9  CL  --    < > 102 103 107 108 115*  CO2  --    < > 26 29 24 24  18*  GLUCOSE  --    < > 72 80 83 91 88  BUN  --    < > 12 12 9 11 9   CREATININE  --    < > 0.77 0.86 0.77 1.24* 1.06*  CALCIUM  --    < > 7.9* 8.1* 8.2* 8.5* 8.8*  MG 1.2*  --  1.4*  --  1.6*  --  1.8   < > = values in this interval not displayed.   GFR: Estimated Creatinine Clearance: 36 mL/min (A) (by C-G formula based on SCr of 1.06 mg/dL (H)). Liver Function Tests: Recent Labs  Lab 02/20/21 1620 02/21/21 0425  AST 33 43*  ALT 24 28  ALKPHOS 59 59  BILITOT 1.3* 1.0  PROT 7.3 6.7  ALBUMIN 2.6* 2.4*   No results for input(s): LIPASE, AMYLASE in the last 168 hours. No results for input(s): AMMONIA in the last 168 hours. Coagulation Profile: Recent Labs  Lab 02/20/21 1620  INR 1.4*   Cardiac Enzymes: Recent Labs  Lab 02/20/21 2315  CKTOTAL 80   BNP (last 3 results) No results for input(s): PROBNP in the last 8760 hours. HbA1C: No results for input(s): HGBA1C in the last 72 hours. CBG: Recent Labs  Lab 02/26/21 1116 02/26/21 1633 02/26/21 2126 02/27/21 0516 02/27/21 1103  GLUCAP 92 96 105* 97 72   Lipid Profile: No results for input(s): CHOL, HDL, LDLCALC, TRIG, CHOLHDL, LDLDIRECT in the last 72 hours. Thyroid Function Tests: No results for input(s): TSH, T4TOTAL, FREET4, T3FREE, THYROIDAB in the last 72 hours. Anemia Panel: No results for input(s): VITAMINB12, FOLATE, FERRITIN, TIBC, IRON, RETICCTPCT  in the last 72 hours. Sepsis Labs: Recent Labs  Lab 02/20/21 1620 02/20/21 1713  LATICACIDVEN 1.1 1.3    Recent Results (from the past 240 hour(s))  Blood Culture (routine x 2)     Status: None   Collection Time: 02/20/21  4:20 PM   Specimen: BLOOD  Result Value Ref Range Status   Specimen  Description BLOOD PICC LINE  Final   Special Requests   Final    BOTTLES DRAWN AEROBIC AND ANAEROBIC Blood Culture adequate volume   Culture   Final    NO GROWTH 5 DAYS Performed at [facility], [address], [place], [place] [address]    Report Status 02/25/2021 FINAL  Final  Blood Culture (routine x 2)     Status: None   Collection Time: 02/20/21  5:13 PM   Specimen: BLOOD  Result Value Ref Range Status   Specimen Description BLOOD SITE NOT SPECIFIED  Final   Special Requests   Final    BOTTLES DRAWN AEROBIC AND ANAEROBIC Blood Culture adequate volume   Culture   Final    NO GROWTH 5 DAYS Performed at [facility], [address], [place], [place] [address]    Report Status 02/25/2021 FINAL  Final  Resp Panel by RT-PCR (Flu A&B, Covid) Nasopharyngeal Swab     Status: None   Collection Time: 02/20/21  8:55 PM   Specimen: Nasopharyngeal Swab; Nasopharyngeal(NP) swabs in vial transport medium  Result Value Ref Range Status   SARS Coronavirus 2 by RT PCR NEGATIVE NEGATIVE Final    Comment: (NOTE) SARS-CoV-2 target nucleic acids are NOT DETECTED.  The SARS-CoV-2 RNA is generally detectable in upper respiratory specimens during the acute phase of infection. The lowest concentration of SARS-CoV-2 viral copies this assay can detect is 138 copies/mL. A negative result does not preclude SARS-Cov-2 infection and should not be used as the sole basis for treatment or other patient management decisions. A negative result may occur with  improper specimen collection/handling, submission of specimen other than nasopharyngeal swab, presence of viral mutation(s) within the areas targeted by this assay, and inadequate number of viral copies(<138 copies/mL). A negative result must be combined with clinical observations, patient history, and epidemiological information. The expected result is Negative.  Fact Sheet for Patients:  BloggerCourse.com  Fact  Sheet for Healthcare Providers:  SeriousBroker.it  This test is no t yet approved or cleared by the [place] FDA and  has been authorized for detection and/or diagnosis of SARS-CoV-2 by FDA under an Emergency Use Authorization (EUA). This EUA will remain  in effect (meaning this test can be used) for the duration of the COVID-19 declaration under Section 564(b)(1) of the Act, 21 U.S.C.section 360bbb-3(b)(1), unless the authorization is terminated  or revoked sooner.       Influenza A by PCR NEGATIVE NEGATIVE Final   Influenza B by PCR NEGATIVE NEGATIVE Final    Comment: (NOTE) The Xpert Xpress SARS-CoV-2/FLU/RSV plus assay is intended as an aid in the diagnosis of influenza from Nasopharyngeal swab specimens and should not be used as a sole basis for treatment. Nasal washings and aspirates are unacceptable for Xpert Xpress SARS-CoV-2/FLU/RSV testing.  Fact Sheet for Patients: BloggerCourse.com  Fact Sheet for Healthcare Providers: SeriousBroker.it  This test is not yet approved or cleared by the [place] FDA and has been authorized for detection and/or diagnosis of SARS-CoV-2 by FDA under an Emergency Use Authorization (EUA). This EUA will remain in effect (meaning this  test can be used) for the duration of the COVID-19 declaration under Section 564(b)(1) of the Act, 21 U.S.C. section 360bbb-3(b)(1), unless the authorization is terminated or revoked.  Performed at [facility], [address] [place], [place] [address]   Culture, blood (Routine X 2) w Reflex to ID Panel     Status: None   Collection Time: 02/22/21 10:19 AM   Specimen: BLOOD LEFT HAND  Result Value Ref Range Status   Specimen Description BLOOD LEFT HAND  Final   Special Requests   Final    BOTTLES DRAWN AEROBIC AND ANAEROBIC Blood Culture results may not be optimal due to an inadequate volume of blood received in  culture bottles   Culture   Final    NO GROWTH 5 DAYS Performed at [facility], [address] [place], [place] [address]    Report Status 02/27/2021 FINAL  Final  Culture, blood (Routine X 2) w Reflex to ID Panel     Status: None   Collection Time: 02/22/21 10:26 AM   Specimen: BLOOD LEFT HAND  Result Value Ref Range Status   Specimen Description BLOOD LEFT HAND  Final   Special Requests   Final    BOTTLES DRAWN AEROBIC AND ANAEROBIC Blood Culture adequate volume   Culture   Final    NO GROWTH 5 DAYS Performed at [facility], [address], [place], [place] [address]    Report Status 02/27/2021 FINAL  Final      Radiology Studies: No results found.  Scheduled Meds:  atorvastatin  80 mg Oral Daily   Chlorhexidine Gluconate Cloth  6 each Topical Daily   heparin injection (subcutaneous)  5,000 Units Subcutaneous Q8H   hydrocerin   Topical BID   insulin aspart  0-9 Units Subcutaneous TID WC   linezolid  600 mg Oral Q12H   metoprolol tartrate  12.5 mg Oral BID    morphine injection  1 mg Intravenous Once   nystatin  5 mL Oral QID   pantoprazole  40 mg Oral BID   potassium chloride  20 mEq Oral BID   sodium chloride flush  3 mL Intravenous Q12H   sodium chloride flush  3 mL Intravenous Q12H   Continuous Infusions:  cefTRIAXone (ROCEPHIN)  IV Stopped (02/26/21 2153)     LOS: 6 days   Time spent: 37 minutes   Hughie Closs, MD [facility]  02/27/2021, 12:47 PM  Please page via Loretha Stapler and do not message via secure chat for anything urgent. Secure chat can be used for anything non urgent.  How to contact the [facility] Attending or Consulting provider 7A - 7P or covering provider during after hours 7P -7A, for this patient?  Check the care team in [facility] and look for a) attending/consulting [facility] provider listed and b) the [facility] team listed. Page or secure chat 7A-7P. Log into [email or web address] and use [facility] universal password to access. If you do not have the  password, please contact the hospital operator. Locate the [facility] provider you are looking for under [facility] and page to a number that you can be directly reached. If you still have difficulty reaching the provider, please page the [facility] (Director on Call) for the Hospitalists listed on amion for assistance.

## 2021-02-27 NOTE — Op Note (Signed)
[facility] Patient Name: Christy Higgins Procedure Date : 02/27/2021 MRN: [id number] Attending MD: Gerrit Heck , MD Date of Birth: [birth year] CSN: [id number] Age: 70 Admit Type: Inpatient Procedure:                Colonoscopy Indications:              Heme positive stool, Normocytic anemia                           70 yo female admitted with cellulitis/osteomyelitis                            of right wrist, noted to have normocytic anemia.                            Normal iron indices. LDH mildly elevated, but                            normal DAT and peripheral smear. No previous EGD or                            colonoscopy. Providers:                Gerrit Heck, MD, Dulcy Fanny, Tyna Jaksch Technician Referring MD:              Medicines:                Monitored Anesthesia Care Complications:            No immediate complications. Estimated Blood Loss:     Estimated blood loss: none. Procedure:                Pre-Anesthesia Assessment:                           - Prior to the procedure, a History and Physical                            was performed, and patient medications and                            allergies were reviewed. The patient's tolerance of                            previous anesthesia was also reviewed. The risks                            and benefits of the procedure and the sedation                            options and risks were discussed with the patient.                            All questions were answered, and informed  consent                            was obtained. Prior Anticoagulants: The patient has                            taken no previous anticoagulant or antiplatelet                            agents except for aspirin. ASA Grade Assessment: II                            - A patient with mild systemic disease. After                            reviewing the risks and benefits, the  patient was                            deemed in satisfactory condition to undergo the                            procedure.                           After obtaining informed consent, the colonoscope                            was passed under direct vision. Throughout the                            procedure, the patient's blood pressure, pulse, and                            oxygen saturations were monitored continuously. The                            [id number]-HQ190L ([id number] Olympus colonoscope was                            introduced through the anus and advanced to the the                            cecum, identified by appendiceal orifice and                            ileocecal valve. The colonoscopy was performed                            without difficulty. The patient tolerated the                            procedure well. The quality of the bowel                            preparation was good.  The ileocecal valve,                            appendiceal orifice, and rectum were photographed. Scope In: 8:58:55 AM Scope Out: [id number] AM Scope Withdrawal Time: 0 hours 12 minutes 2 seconds  Total Procedure Duration: 0 hours 17 minutes 22 seconds  Findings:      The perianal and digital rectal examinations were normal.      Multiple small and large-mouthed diverticula were found in the sigmoid       colon, descending colon and transverse colon.      Normal mucosa was found in the entire colon. No areas of active bleeding       or high grade stigmata of bleeding were noted on this study.      The retroflexed view of the distal rectum and anal verge was normal and       showed no anal or rectal abnormalities. (Due to software issue,       retroflexed images not captured in this report). Impression:               - Diverticulosis in the sigmoid colon, in the                            descending colon and in the transverse colon.                           - Normal mucosa in the  entire examined colon.                           - The distal rectum and anal verge are normal on                            retroflexion view.                           - No specimens collected. Recommendation:           - Return patient to hospital ward for ongoing care.                           - Advance diet as tolerated.                           - Continue present medications.                           - Continue daily CBC checks while inpatient with                            additional blood products as needed per protocol.                           - Please see EGD report for additional details                            regarding ongoing inpatient management.                           -  GI service will sign off at this time. Please do                            not hesitate to contact the on-call with any                            additional questions or concerns. Procedure Code(s):        --- Professional ---                           [phone], Colonoscopy, flexible; diagnostic, including                            collection of specimen(s) by brushing or washing,                            when performed (separate procedure) Diagnosis Code(s):        --- Professional ---                           R19.5, Other fecal abnormalities                           D62, Acute posthemorrhagic anemia                           K57.30, Diverticulosis of large intestine without                            perforation or abscess without bleeding CPT copyright 2019 American Medical Association. All rights reserved. The codes documented in this report are preliminary and upon coder review may  be revised to meet current compliance requirements. Gerrit Heck, MD 02/27/2021 9:39:17 AM Number of Addenda: 0

## 2021-02-27 NOTE — Anesthesia Postprocedure Evaluation (Signed)
Anesthesia Post Note  Patient: Christy Higgins  Procedure(s) Performed: ESOPHAGOGASTRODUODENOSCOPY (EGD) (Left) COLONOSCOPY (Left) BIOPSY     Patient location during evaluation: PACU Anesthesia Type: MAC Level of consciousness: awake and alert Pain management: pain level controlled Vital Signs Assessment: post-procedure vital signs reviewed and stable Respiratory status: spontaneous breathing, nonlabored ventilation, respiratory function stable and patient connected to nasal cannula oxygen Cardiovascular status: stable and blood pressure returned to baseline Postop Assessment: no apparent nausea or vomiting Anesthetic complications: no   No notable events documented.  Last Vitals:  Vitals:   02/27/21 0953 02/27/21 1013  BP: (!) 145/55 127/61  Pulse: (!) 55 (!) 54  Resp: (!) 28 14  Temp:  37.2 C  SpO2: 100% 100%    Last Pain:  Vitals:   02/27/21 1013  TempSrc: Oral  PainSc: 9                  Jovahn Breit P Torrie Lafavor

## 2021-02-27 NOTE — Interval H&P Note (Signed)
History and Physical Interval Note:  02/27/2021 8:28 AM  Christy Higgins  has presented today for surgery, with the diagnosis of Anemia, FOBT+ stool.  The various methods of treatment have been discussed with the patient and family. After consideration of risks, benefits and other options for treatment, the patient has consented to  Procedure(s): ESOPHAGOGASTRODUODENOSCOPY (EGD) (Left) COLONOSCOPY (Left) as a surgical intervention.  The patient's history has been reviewed, patient examined, no change in status, stable for surgery.  I have reviewed the patient's chart and labs.  Questions were answered to the patient's satisfaction.     Verlin Dike Joslynn Jamroz

## 2021-02-27 NOTE — Plan of Care (Signed)
  Problem: Education: Goal: Knowledge of General Education information will improve Description: Including pain rating scale, medication(s)/side effects and non-pharmacologic comfort measures Outcome: Progressing   Problem: Health Behavior/Discharge Planning: Goal: Ability to manage health-related needs will improve Outcome: Progressing   Problem: Clinical Measurements: Goal: Will remain free from infection Outcome: Progressing   Problem: Pain Managment: Goal: General experience of comfort will improve Outcome: Progressing   Problem: Pain Managment: Goal: General experience of comfort will improve Outcome: Progressing

## 2021-02-27 NOTE — Op Note (Signed)
[facility] Patient Name: Christy Higgins Procedure Date : 02/27/2021 MRN: [id number] Attending MD: Gerrit Heck , MD Date of Birth: [birth year] CSN: [id number] Age: 70 Admit Type: Inpatient Procedure:                Upper GI endoscopy Indications:              Normocytic anemia, Heme positive stool Providers:                Gerrit Heck, MD, Dulcy Fanny, Tyna Jaksch Technician Referring MD:              Medicines:                Monitored Anesthesia Care Complications:            No immediate complications. Estimated Blood Loss:     Estimated blood loss was minimal. Procedure:                Pre-Anesthesia Assessment:                           - Prior to the procedure, a History and Physical                            was performed, and patient medications and                            allergies were reviewed. The patient's tolerance of                            previous anesthesia was also reviewed. The risks                            and benefits of the procedure and the sedation                            options and risks were discussed with the patient.                            All questions were answered, and informed consent                            was obtained. Prior Anticoagulants: The patient has                            taken no previous anticoagulant or antiplatelet                            agents except for aspirin. ASA Grade Assessment: II                            - A patient with mild systemic disease. After  reviewing the risks and benefits, the patient was                            deemed in satisfactory condition to undergo the                            procedure.                           After obtaining informed consent, the endoscope was                            passed under direct vision. Throughout the                            procedure, the patient's blood  pressure, pulse, and                            oxygen saturations were monitored continuously. The                            GIF-H190 ([id number]) Olympus endoscope was introduced                            through the mouth, and advanced to the third part                            of duodenum. The upper GI endoscopy was                            accomplished without difficulty. The patient                            tolerated the procedure well. Scope In: Scope Out: Findings:      The examined esophagus was normal.      The gastroesophageal flap valve was visualized endoscopically and       classified as Hill Grade III (minimal fold, loose to endoscope, hiatal       hernia likely).      Localized mild inflammation characterized by congestion (edema) and       erythema was found in the gastric antrum. Biopsies were taken with a       cold forceps for histology. Estimated blood loss was minimal.      The gastric fundus, gastric body and incisura were normal.      The examined duodenum was normal. Biopsies were taken with a cold       forceps for histology. Estimated blood loss was minimal. Impression:               - Normal esophagus.                           - Gastroesophageal flap valve classified as Hill                            Grade III (minimal fold, loose to endoscope, hiatal  hernia likely).                           - Gastritis. Biopsied.                           - Normal gastric fundus, gastric body and incisura.                           - Normal examined duodenum. Biopsied. Recommendation:           - Return patient to hospital ward for ongoing care.                           - Advance diet as tolerated.                           - Continue present medications.                           - Use Protonix (pantoprazole) 40 mg PO BID for 6                            weeks to promote mucosal healing of gastritis, then                             reduce to 40 mg daily. Can titrate to lowest                            effective dose, or off completely if no upper GI                            symptoms.                           - Await pathology results.                           - GI service will sign off at this time. Please do                            not hesitate to contact the on-call with any                            additional questions or concerns. Procedure Code(s):        --- Professional ---                           [phone], Esophagogastroduodenoscopy, flexible,                            transoral; with biopsy, single or multiple Diagnosis Code(s):        --- Professional ---                           K29.70, Gastritis, unspecified, without bleeding  D62, Acute posthemorrhagic anemia                           R19.5, Other fecal abnormalities CPT copyright 2019 [employer]. All rights reserved. The codes documented in this report are preliminary and upon coder review may  be revised to meet current compliance requirements. Gerrit Heck, MD 02/27/2021 9:32:14 AM Number of Addenda: 0

## 2021-02-27 NOTE — Progress Notes (Signed)
Talked to endo and gave report, they will get patient around 8am.

## 2021-02-27 NOTE — Anesthesia Procedure Notes (Signed)
Procedure Name: MAC Date/Time: 02/27/2021 8:44 AM Performed by: Lowella Dell, CRNA Pre-anesthesia Checklist: Patient identified, Emergency Drugs available, Suction available, Patient being monitored and Timeout performed Oxygen Delivery Method: Nasal cannula Placement Confirmation: positive ETCO2 Dental Injury: Teeth and Oropharynx as per pre-operative assessment

## 2021-02-27 NOTE — Progress Notes (Signed)
Patient does not want subq heparin anymore, she says it is too much for her and it is very painful.

## 2021-02-27 NOTE — Progress Notes (Signed)
Mobility Specialist Progress Note:   02/27/21 1236  Mobility  Activity Ambulated in hall  Level of Assistance Standby assist, set-up cues, supervision of patient - no hands on  Assistive Device None  Distance Ambulated (ft) 520 ft  Mobility Ambulated with assistance in hallway  Mobility Response Tolerated well  Mobility performed by Mobility specialist  $Mobility charge 1 Mobility   Pt received in bed willing to participate in mobility. No complaints of pain and asymptomatic. Pt returned to EOB with call bell in reach and all needs met.   [facility] Health and safety inspector Phone [phone]

## 2021-02-27 NOTE — Transfer of Care (Signed)
Immediate Anesthesia Transfer of Care Note  Patient: Maddalynn Barnard  Procedure(s) Performed: ESOPHAGOGASTRODUODENOSCOPY (EGD) (Left) COLONOSCOPY (Left) BIOPSY  Patient Location: PACU and Short Stay  Anesthesia Type:MAC  Level of Consciousness: drowsy and patient cooperative  Airway & Oxygen Therapy: Patient Spontanous Breathing  Post-op Assessment: Report given to RN and Post -op Vital signs reviewed and stable  Post vital signs: Reviewed and stable  Last Vitals:  Vitals Value Taken Time  BP 134/49 02/27/21 0923  Temp    Pulse 81 02/27/21 0922  Resp 31 02/27/21 0922  SpO2 91 % 02/27/21 0922  Vitals shown include unvalidated device data.  Last Pain:  Vitals:   02/27/21 0805  TempSrc: Temporal  PainSc: 9       Patients Stated Pain Goal: 0 (45/62/56 3893)  Complications: No notable events documented.

## 2021-02-28 ENCOUNTER — Other Ambulatory Visit (HOSPITAL_COMMUNITY): Payer: Self-pay

## 2021-02-28 ENCOUNTER — Encounter (HOSPITAL_COMMUNITY): Payer: Self-pay | Admitting: Gastroenterology

## 2021-02-28 DIAGNOSIS — D62 Acute posthemorrhagic anemia: Secondary | ICD-10-CM | POA: Diagnosis not present

## 2021-02-28 DIAGNOSIS — N179 Acute kidney failure, unspecified: Secondary | ICD-10-CM | POA: Diagnosis not present

## 2021-02-28 DIAGNOSIS — M009 Pyogenic arthritis, unspecified: Secondary | ICD-10-CM | POA: Diagnosis not present

## 2021-02-28 DIAGNOSIS — E119 Type 2 diabetes mellitus without complications: Secondary | ICD-10-CM | POA: Diagnosis not present

## 2021-02-28 LAB — CBC WITH DIFFERENTIAL/PLATELET
Abs Immature Granulocytes: 0.05 10*3/uL (ref 0.00–0.07)
Basophils Absolute: 0 10*3/uL (ref 0.0–0.1)
Basophils Relative: 0 %
Eosinophils Absolute: 0.2 10*3/uL (ref 0.0–0.5)
Eosinophils Relative: 4 %
HCT: 28.4 % — ABNORMAL LOW (ref 36.0–46.0)
Hemoglobin: 8.7 g/dL — ABNORMAL LOW (ref 12.0–15.0)
Immature Granulocytes: 1 %
Lymphocytes Relative: 28 %
Lymphs Abs: 1.9 10*3/uL (ref 0.7–4.0)
MCH: 27.4 pg (ref 26.0–34.0)
MCHC: 30.6 g/dL (ref 30.0–36.0)
MCV: 89.3 fL (ref 80.0–100.0)
Monocytes Absolute: 0.8 10*3/uL (ref 0.1–1.0)
Monocytes Relative: 11 %
Neutro Abs: 3.9 10*3/uL (ref 1.7–7.7)
Neutrophils Relative %: 56 %
Platelets: 243 10*3/uL (ref 150–400)
RBC: 3.18 MIL/uL — ABNORMAL LOW (ref 3.87–5.11)
RDW: 16.6 % — ABNORMAL HIGH (ref 11.5–15.5)
WBC: 6.8 10*3/uL (ref 4.0–10.5)
nRBC: 0 % (ref 0.0–0.2)

## 2021-02-28 LAB — BASIC METABOLIC PANEL
Anion gap: 7 (ref 5–15)
BUN: 6 mg/dL — ABNORMAL LOW (ref 8–23)
CO2: 19 mmol/L — ABNORMAL LOW (ref 22–32)
Calcium: 8.8 mg/dL — ABNORMAL LOW (ref 8.9–10.3)
Chloride: 113 mmol/L — ABNORMAL HIGH (ref 98–111)
Creatinine, Ser: 0.8 mg/dL (ref 0.44–1.00)
GFR, Estimated: >60 mL/min (ref >60)
Glucose, Bld: 92 mg/dL (ref 70–99)
Potassium: 5 mmol/L (ref 3.5–5.1)
Sodium: 139 mmol/L (ref 135–145)

## 2021-02-28 LAB — GLUCOSE, CAPILLARY
Glucose-Capillary: 93 mg/dL (ref 70–99)
Glucose-Capillary: 107 mg/dL — ABNORMAL HIGH (ref 70–99)

## 2021-02-28 LAB — SURGICAL PATHOLOGY

## 2021-02-28 MED ORDER — PANTOPRAZOLE SODIUM 40 MG PO TBEC: 40.0000 mg | DELAYED_RELEASE_TABLET | Freq: Two times a day (BID) | ORAL | 0 refills | Status: DC

## 2021-02-28 MED ORDER — DOXYCYCLINE HYCLATE 100 MG PO CAPS: 100.0000 mg | ORAL_CAPSULE | Freq: Two times a day (BID) | ORAL | 0 refills | Status: DC

## 2021-02-28 MED ORDER — LINEZOLID 600 MG PO TABS: 600.0000 mg | ORAL_TABLET | Freq: Two times a day (BID) | ORAL | 0 refills | Status: AC

## 2021-02-28 MED ORDER — ATORVASTATIN CALCIUM 40 MG PO TABS: 40.0000 mg | ORAL_TABLET | Freq: Every day | ORAL | 0 refills | Status: AC

## 2021-02-28 NOTE — Discharge Summary (Signed)
Physician Discharge Summary  Christy Higgins JQZ:[id number] DOB: 1950/09/17 DOA: 02/20/2021  PCP: Shanon Rosser, PA-C  Admit date: 02/20/2021 Discharge date: 02/28/2021 30 Day Unplanned Readmission Risk Score    Flowsheet Row ED to Hosp-Admission (Current) from 02/20/2021 in [place] [facility] PCU  30 Day Unplanned Readmission Risk Score (%) 15.61 Filed at 02/28/2021 0801       This score is the patient's risk of an unplanned readmission within 30 days of being discharged (0 -100%). The score is based on dignosis, age, lab data, medications, orders, and past utilization.   Low:  0-14.9   Medium: 15-21.9   High: 22-29.9   Extreme: 30 and above          Admitted From: Home Disposition: Home  Recommendations for Outpatient Follow-up:  Follow up with PCP in 1-2 weeks Please obtain BMP/CBC in one week Follow-up with ID/they will schedule appointment Follow-up with cardiology, please call their office to schedule appointment Please follow up with your PCP on the following pending results: Unresulted Labs (From admission, onward)    None         Home Health: None Equipment/Devices: None  Discharge Condition: Stable CODE STATUS: Full code Diet recommendation: Low-sodium  Subjective: Seen and examined.  She has no complaints.  She is ready to go home today.  Brief/Interim Summary: Christy Higgins is an 70 y.o. female with medical history significant of cellulitis, diabetes, hypertension, hyperlipidemia presented for ongoing shortness of breath and fever.  She was admitted to hospital service due to to acute CHF, acute respiratory failure with hypoxia and continued cellulitis/septic joint with bacteremia.  Hospital stay was complicated by worsening anemia from GI bleed.  Detailed hospitalization course as below.   Acute Respiratory Failure with hypoxia secondary to acute on chronic diastolic congestive heart failure/elevated troponin: Troponin 394 > 498 > 527 > 823 > 413 > 419,  suspect demand ischemia in view of elevated BNP and normal wall motion on recent echocardiogram, high-output heart failure. CT PE study in the ED was negative for PE, however it did show evidence of edema and effusions consistent with heart failure picture.  Started on diuretics,  Cardiology saw her several days ago, recommended resuming aspirin once her GI issues are resolved and they will plan for outpatient ischemic work-up.  Patient is doing well regarding her respiratory issue, currently no shortness of breath and she is saturating 100% on room air for past several days.  She was also started on atorvastatin 20 mg p.o. daily by cardiology.  Her LDL is only 86.  I am discharging her on 40 mg of atorvastatin.  She will follow-up with her cardiologist.   AKI: Creatinine got worse but improved and now back to baseline/normal.   Cellulitis/Septic joint Status post serial I&D on 10/19, 10/21, and 10/24 with negative cultures (few gram-positive cocci were noted on gram stain 10/19; received Bactrim prior to surgery).  Discharged 10/26 on daptomycin and ceftriaxone for planned 6-week course.  However, readmitted 11/2 with fevers, shortness of breath, cough.  ?  Possible drug fever with daptomycin.  Daptomycin induced eosinophilic pneumonitis felt to be less likely given her rapid improvement and lack of peripheral eosinophilia, however, diagnosis not entirely excluded.  Repeat MRI of the wrist showed a tiny fluid collection for which orthopedics was consulted, however, Ortho has no plans for further procedures and her sutures were removed 02/25/2021.  Due to worsening creatinine, vancomycin was stopped, Rocephin continued and linezolid added, plan is to continue  linezolid through 03/11/2021 and  resume doxycycline from 11/22 through 03/25/2021 and continue Rocephin at discharge as well. -blood culture NGTD   Diabetes mellitus type II: Resume home medications.   Hypertension -Very well controlled.  Losartan  was held due to AKI but now AKI resolved, she can resume her home medications.   Oral candidiasis: Resolved.  She was given nystatin swish and swallow.   Hypokalemia/hypomagnesemia Resolved.    Anemia acute blood loss anemia/gastritis/nonbleeding diverticulosis: Patient's hemoglobin dropped to 6.5 on 02/22/2021, FOBT positive.  Iron low and TIBC also low.  She received 2 units of PRBC.  Hemoglobin has remained stable since then.  Heparin was discontinued.  GI consulted.  She underwent EGD and colonoscopy 02/27/2021 and was found to have mild gastritis, nonbleeding diverticulosis.  No active bleeding.  GI recommended continuing PPI twice daily for 6 weeks followed by once daily.  I have prescribed her 6 weeks of PPI.  GI also cleared her to resume aspirin, I have discharged her on aspirin 81 mg as cardiology had recommended.  Discharge Diagnoses:  Principal Problem:   Septic arthritis ([place]) Active Problems:   Acute CHF (congestive heart failure) (HCC)   Demand ischemia (HCC)   HTN (hypertension)   DM (diabetes mellitus) ([place])   Acute respiratory failure with hypoxia (HCC)   CHF (congestive heart failure) (HCC)   NSTEMI (non-ST elevated myocardial infarction) ([place])   Hand abscess   Fever   Eosinophilic pneumonia (HCC)   Acute blood loss anemia   Diverticulosis of colon without hemorrhage   Heme positive stool   Normocytic anemia   Gastritis    Discharge Instructions  Discharge Instructions     Ambulatory referral to Physical Therapy   Complete by: As directed       Allergies as of 02/28/2021   No Known Allergies      Medication List     STOP taking these medications    daptomycin  IVPB Commonly known as: CUBICIN       TAKE these medications    acetaminophen-codeine 300-30 MG tablet Commonly known as: TYLENOL #3 Take 1 tablet by mouth every 4 (four) hours as needed for moderate pain.   atorvastatin 40 MG tablet Commonly known as: LIPITOR Take 1 tablet (40 mg  total) by mouth daily. Start taking on: March 01, 2021   cefTRIAXone  IVPB Commonly known as: ROCEPHIN Inject 2 g into the vein daily for 27 days. Indication:  Septic Arthritis First Dose: Yes Last Day of Therapy:  03/25/21 Labs - Once weekly:  CBC/D and BMP, Labs - Every other week:  ESR and CRP Method of administration: IV Push Method of administration may be changed at the discretion of home infusion pharmacist based upon assessment of the patient and/or caregiver's ability to self-administer the medication ordered.   doxycycline 100 MG capsule Commonly known as: VIBRAMYCIN Take 1 capsule (100 mg total) by mouth 2 (two) times daily for 13 days. Start 03/12/21 Start taking on: March 12, 2021   HYDROcodone-acetaminophen 5-325 MG tablet Commonly known as: NORCO/VICODIN Take 1 tablet by mouth every 6 (six) hours as needed for moderate pain.   linezolid 600 MG tablet Commonly known as: ZYVOX Take 1 tablet (600 mg total) by mouth 2 (two) times daily for 11 days.   losartan 100 MG tablet Commonly known as: COZAAR Take 100 mg by mouth daily.   meloxicam 7.5 MG tablet Commonly known as: MOBIC Take 7.5 mg by mouth daily.   metFORMIN 750 MG  24 hr tablet Commonly known as: GLUCOPHAGE-XR Take 750 mg by mouth every evening.   pantoprazole 40 MG tablet Commonly known as: PROTONIX Take 1 tablet (40 mg total) by mouth 2 (two) times daily.   traMADol 50 MG tablet Commonly known as: ULTRAM Take 50 mg by mouth every 6 (six) hours as needed for moderate pain.        Follow-up Information     [facility], [facility] Follow up.   Specialty: [place] Why: [facility] for iv abx Contact information: [place] [address] [phone]         Shanon Rosser, PA-C. Go on 03/07/2021.   Specialty: Physician Assistant Why: $[email or web address]$ :[place]ane information: [place] [address] [phone]         [place] [facility] Follow up.   Why: outpatient rehab, [address] op        Mayer, Lavell Islam, MD. Go on 03/06/2021.   Specialty: Infectious Diseases Why: appt at 9am Contact information: [address] [address] [phone]                No Known Allergies  Consultations: Cardiology, GI and ID   Procedures/Studies: DG Chest 2 View  Result Date: 02/20/2021 CLINICAL DATA:  Shortness of breath EXAM: CHEST - 2 VIEW COMPARISON:  01/13/2016 FINDINGS: Low volume examination with diffuse bilateral interstitial opacity and small pleural effusions. Left upper extremity PICC. The heart and mediastinum are normal. Disc degenerative disease of the thoracic spine. IMPRESSION: Low volume examination with diffuse bilateral interstitial opacity and small pleural effusions, consistent with edema or infection. Electronically Signed   By: Delanna Ahmadi M.D.   On: 02/20/2021 08:48   DG Wrist Complete Right  Result Date: 02/03/2021 CLINICAL DATA:  Pain, swelling, fever EXAM: RIGHT WRIST - COMPLETE 3+ VIEW COMPARISON:  01/13/2016 FINDINGS: Degenerative changes in the radiocarpal joint with joint space narrowing, subchondral sclerosis and cyst formation. Diffuse soft tissue swelling. No acute bony abnormality. Specifically, no fracture, subluxation, or dislocation. IMPRESSION: Degenerative changes in the radiocarpal joint. Diffuse soft tissue swelling. No acute bony abnormality. Electronically Signed   By: Rolm Baptise M.D.   On: 02/03/2021 17:18   CT Soft Tissue Neck W Contrast  Result Date: 02/04/2021 CLINICAL DATA:  Lymphadenopathy and fever EXAM: CT NECK WITH CONTRAST TECHNIQUE: Multidetector CT imaging of the neck was performed using the standard protocol following the bolus administration of intravenous contrast. CONTRAST:  12mL OMNIPAQUE IOHEXOL 350 MG/ML SOLN COMPARISON:  None. FINDINGS: PHARYNX AND LARYNX: The nasopharynx, oropharynx and larynx are normal.  Visible portions of the oral cavity, tongue base and floor of mouth are normal. Normal epiglottis, vallecula and pyriform sinuses. The larynx is normal. No retropharyngeal abscess, effusion or lymphadenopathy. SALIVARY GLANDS: Normal parotid, submandibular and sublingual glands. THYROID: Normal. LYMPH NODES: No enlarged or abnormal density lymph nodes. VASCULAR: Mild atherosclerotic calcification in the right carotid bifurcation. LIMITED INTRACRANIAL: Normal. VISUALIZED ORBITS: Normal. MASTOIDS AND VISUALIZED PARANASAL SINUSES: No fluid levels or advanced mucosal thickening. No mastoid effusion. SKELETON: No bony spinal canal stenosis. No lytic or blastic lesions. Multilevel degenerative disc disease. UPPER CHEST: Clear. OTHER: None. IMPRESSION: 1. No cervical lymphadenopathy. 2. Normal pharynx and larynx. 3. Mild atherosclerotic calcification at the right carotid bifurcation. Electronically Signed   By: Ulyses Jarred M.D.   On: 02/04/2021 02:14   CT Angio Chest PE W/Cm &/Or Wo Cm  Result Date: 02/20/2021 CLINICAL DATA:  PE suspected, high prob EXAM: CT ANGIOGRAPHY CHEST WITH CONTRAST TECHNIQUE: Multidetector CT imaging of the chest was performed using the standard protocol during bolus administration of intravenous contrast. Multiplanar CT image reconstructions and MIPs were obtained to evaluate the vascular anatomy. CONTRAST:  21mL OMNIPAQUE IOHEXOL 350 MG/ML SOLN COMPARISON:  04/14/2011 FINDINGS: Cardiovascular: There is adequate opacification of the central pulmonary arteries. No intraluminal filling defect identified to suggest acute pulmonary embolism. The central pulmonary arteries are of normal caliber. No significant coronary artery calcification. Global cardiac size is mildly enlarged. No pericardial effusion. The thoracic aorta is unremarkable. Left subclavian central venous catheter tip is seen within the superior vena cava. Mediastinum/Nodes: No pathologic thoracic adenopathy. Visualized thyroid is  unremarkable. Esophagus is unremarkable. Lungs/Pleura: There is moderate bilateral perihilar and suprahilar ground-glass pulmonary infiltrate in a "bat wing" configuration most suggestive of alveolar pulmonary edema. Moderate bilateral pleural effusions are present with compressive atelectasis of the lower lobes bilaterally. No pneumothorax. No central obstructing lesion. Upper Abdomen: No acute abnormality. Musculoskeletal: The osseous structures are age-appropriate. No acute bone abnormality. Review of the MIP images confirms the above findings. IMPRESSION: No pulmonary embolism. Bilateral perihilar and suprahilar pulmonary infiltrates and moderate bilateral pleural effusions most in keeping with moderate cardiogenic failure. Mild cardiomegaly. Electronically Signed   By: Fidela Salisbury M.D.   On: 02/20/2021 19:37   MR HAND RIGHT WO CONTRAST  Result Date: 02/06/2021 CLINICAL DATA:  Septic arthritis suspected, hand, no prior imaging EXAM: MRI OF THE RIGHT HAND WITHOUT CONTRAST TECHNIQUE: Multiplanar, multisequence MR imaging of the right hand was performed. No intravenous contrast was administered. COMPARISON:  02/03/2021 radiograph FINDINGS: Bones/Joint/Cartilage There is severe radiocarpal arthritis with subchondral cystic change and bony productive change at the DRUJ. There is scapholunate dissociation with mild proximal migration of the capitate. There is bony edema signal within the lunate and distal radius, likely reactive from arthritis. There is no definitive osteomyelitis. Mild radiocarpal, DRUJ, and intercarpal effusion. Ligaments Torn scapholunate ligament. Mild subluxation of the ECU tendon compatible with ECU subsheath tear. Muscles/Tendons/Soft tissues There is diffuse flexor tenosynovitis in the hand and wrist, with distention of the ulnar and radial bursa in the palm and extending proximally into the forearm (for perspective see coronal STIR image 13 for proximal-distal extent). There is a  focal fluid collection deep to the second digit flexor tendons measuring 1.5 x 3.0 cm short axis and 2.6 cm in length (axial T2 image 24, coronal stir image 13). There is a fluid collection between the fourth and fifth metacarpals measuring 1.2 x 1.0 cm axially and 3.3 cm in length (axial T2 image 24, coronal stir image 9). There are also more superficial fluid collections along the dorsum of the hand and tracking along the ulnar aspect of the wrist superficially. Ulnar-sided more superficial collection measures approximately 3.5 x 1.4 cm axially (axial T2 image 32). Another collection courses along the are superficial aspect of the distal ulna medially measuring 2.6 x 1.3 cm (axial T2 image 40). Diffuse soft tissue swelling of the hand. There is mild tenosynovial joint distension of multiple extensor compartments, most notable of extensor compartments 2, 3, 4, and 5 at the level of the distal radius (axial T2 image 40). IMPRESSION: Severe soft tissue infection of the hand and wrist with multifocal superficial and deep fluid collections, likely abscesses, and with diffuse flexor tenosynovitis as described above. Milder tenosynovitis of extensor compartments 2, 3, 4, and 5 at the level of the distal radius. 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 Bony edema signal within the carpus and distal radius is favored to be related to patient's pre-existing wrist arthritis, though osteomyelitis would be difficult to exclude given the severity of the soft tissue infection. These results were called by telephone at the time of interpretation on 02/06/2021 at 9:10 pm to provider [facility] , who verbally acknowledged these results. Electronically Signed   By: Maurine Simmering M.D.   On: 02/06/2021 21:13   US RENAL  Result Date: 02/06/2021 CLINICAL DATA:  Acute renal injury. EXAM: RENAL / URINARY TRACT ULTRASOUND COMPLETE COMPARISON:  None. FINDINGS: Right Kidney: Renal measurements:  9.9 x 4.2 x 4.0 cm = volume: 86.2 mL. Echogenicity within normal limits. No mass or hydronephrosis visualized. Left Kidney: Renal measurements: 9.0 x 4.8 x 3.8 cm = volume: 85.34 mL. Echogenicity within normal limits. No mass or hydronephrosis visualized. Bladder: Appears normal for degree of bladder distention. Other: None. IMPRESSION: Normal renal ultrasound. Electronically Signed   By: Ronney Asters M.D.   On: 02/06/2021 18:03   MR WRIST RIGHT W WO CONTRAST  Result Date: 02/23/2021 CLINICAL DATA:  Wrist pain, fever, suspected infection EXAM: MR OF THE RIGHT WRIST WITHOUT AND WITH CONTRAST TECHNIQUE: Multiplanar multisequence MR imaging of the right wrist was performed both before and after the administration of intravenous contrast. CONTRAST:  62mL GADAVIST GADOBUTROL 1 MMOL/ML IV SOLN COMPARISON:  Radiographs 02/20/2021 and prior MRI of the hand from 01/27/2021 FINDINGS: Ligaments: Torn scapholunate ligament with SLAC wrist. Triangular fibrocartilage: Large tear suspected for example image 12 series 6 in the TFCC disc. Tendons: Accentuated synovial enhancement in the second and fourth extensor compartments. Carpal tunnel/median nerve: There is some mildly accentuated edema in the median nerve for example on image 19 series 3. Low-grade enhancement surrounding the carpal tunnel structures without substantial fluid signal. Guyon's canal: Unremarkable Joint/cartilage: Severe arthropathy in the radiocarpal joint with prominent subcortical cystic lesions and/or erosions, eroded radial margin of the lunate, SLAC wrist, a prominent chondral thinning. Extensive synovitis in the radiocarpal joint and midcarpal row. Bones/carpal alignment: Large confluent subcortical cysts are present in the distal radius with smaller cystic lesions in the distal ulna. Synovitis in the distal radioulnar joint with associated spurring. SLAC wrist noted. Scattered marrow edema throughout the distal radius, distal ulna, carpus, and  proximal metacarpals. Other: The large fluid collection previously seen between the pronator and extending up along the flexor tendons at the level of the distal radius and ulna is no longer well appreciated. Similarly, the fluid collection lateral to the ulna is no longer appreciated. The volar lateral fluid collection along the wrist shown on the prior exam is likewise currently absent. Along the posterior margin of the distal radioulnar joint and posterior to the although we demonstrate a 1.5 by 0.5 by 1.8 cm (volume = 0.7 cm^3) fluid collection with enhancing margins. This may represent extension of the distal radioulnar joint given how it extends towards the dorsal portion of the joint. Infection is not excluded. Dorsal to the proximal fourth and fifth metacarpals, a 0.6 by 0.3 by 1.1 cm (volume = 0.1 cm^3) fluid collection with enhancing margins is present, probably a small abscess. Improvement in the extensive subcutaneous edema and enhancement in the wrist and tracking into the dorsal hand. IMPRESSION: 1. Substantial improvement with interval resolution of the dominant fluid collections shown on the prior exam, and substantial reduction in the degree of subcutaneous edema and enhancement and generalized soft tissue edema and enhancement. However,  there is still substantial synovitis in the distal radioulnar joint, radiocarpal joint, and midcarpal row region with patchy diffuse marrow edema and enhancement in the region, probably a combination of chronic underlying arthropathy and septic joint. In particular there is a dorsal effusion of the distal radioulnar joint measuring 0.7 cc with enhancing margins. 2. Small abscess dorsal to the proximal fourth and fifth metacarpals, but only about 0.1 cc in volume. 3. SLAC wrist. Electronically Signed   By: Van Clines M.D.   On: 02/23/2021 09:27   DG Hand Complete Right  Result Date: 02/20/2021 CLINICAL DATA:  Postoperative fever after debridement of the  right hand cellulitis. EXAM: RIGHT HAND - COMPLETE 3+ VIEW COMPARISON:  Right wrist x-ray 02/03/2021. MRI right hand 02/06/2021. FINDINGS: There is no acute fracture or dislocation identified. There is stable severe degenerative changes of the radiocarpal joint with joint space narrowing, subchondral sclerosis and cystic formation. There is diffuse marked soft tissue swelling of the hand and wrist. There is no radiopaque foreign body. There are no cortical erosions. IMPRESSION: 1. Marked diffuse soft tissue swelling of the hand and wrist. 2. No acute bony abnormality. Electronically Signed   By: Ronney Asters M.D.   On: 02/20/2021 16:57   ECHOCARDIOGRAM COMPLETE  Result Date: 02/21/2021    ECHOCARDIOGRAM REPORT   Patient Name:   MANAL KREUTZER Date of Exam: 02/21/2021 Medical Rec #:  [id number]    Height:       60.0 in Accession #:    [id number]   Weight:       137.0 lb Date of Birth:  1951/03/30    BSA:          1.589 m Patient Age:    58 years     BP:           120/69 mmHg Patient Gender: F            HR:           60 bpm. Exam Location:  Inpatient Procedure: 2D Echo, Cardiac Doppler and Color Doppler Indications:    Dyspnea  History:        Patient has no prior history of Echocardiogram examinations.                 Signs/Symptoms:Dyspnea and Shortness of Breath; Risk                 Factors:Hypertension, Dyslipidemia and Diabetes.  Sonographer:    Dustin Flock RDCS Referring Phys: [id number] [place]  1. Left ventricular ejection fraction, by estimation, is 60 to 65%. The left ventricle has normal function. The left ventricle has no regional wall motion abnormalities. Left ventricular diastolic parameters are consistent with Grade I diastolic dysfunction (impaired relaxation).  2. Right ventricular systolic function is normal. The right ventricular size is normal. There is normal pulmonary artery systolic pressure. The estimated right ventricular systolic pressure is 32.2 mmHg.  3. The  mitral valve is normal in structure. Trivial mitral valve regurgitation. No evidence of mitral stenosis.  4. The aortic valve is tricuspid. Aortic valve regurgitation is not visualized. No aortic stenosis is present.  5. The inferior vena cava is normal in size with greater than 50% respiratory variability, suggesting right atrial pressure of 3 mmHg.  6. Pleural effusion noted. FINDINGS  Left Ventricle: Left ventricular ejection fraction, by estimation, is 60 to 65%. The left ventricle has normal function. The left ventricle has no regional wall motion abnormalities. The left ventricular internal  cavity size was normal in size. There is  no left ventricular hypertrophy. Left ventricular diastolic parameters are consistent with Grade I diastolic dysfunction (impaired relaxation). Right Ventricle: The right ventricular size is normal. No increase in right ventricular wall thickness. Right ventricular systolic function is normal. There is normal pulmonary artery systolic pressure. The tricuspid regurgitant velocity is 2.46 m/s, and  with an assumed right atrial pressure of 3 mmHg, the estimated right ventricular systolic pressure is 35.6 mmHg. Left Atrium: Left atrial size was normal in size. Right Atrium: Right atrial size was normal in size. Pericardium: Pleural effusion noted. There is no evidence of pericardial effusion. Mitral Valve: The mitral valve is normal in structure. Mild mitral annular calcification. Trivial mitral valve regurgitation. No evidence of mitral valve stenosis. Tricuspid Valve: The tricuspid valve is normal in structure. Tricuspid valve regurgitation is trivial. Aortic Valve: The aortic valve is tricuspid. Aortic valve regurgitation is not visualized. No aortic stenosis is present. Aortic valve mean gradient measures 8.0 mmHg. Aortic valve peak gradient measures 17.5 mmHg. Aortic valve area, by VTI measures 1.87  cm. Pulmonic Valve: The pulmonic valve was normal in structure. Pulmonic valve  regurgitation is not visualized. Aorta: The aortic root is normal in size and structure. Venous: The inferior vena cava is normal in size with greater than 50% respiratory variability, suggesting right atrial pressure of 3 mmHg. IAS/Shunts: No atrial level shunt detected by color flow Doppler.  LEFT VENTRICLE PLAX 2D LVIDd:         4.40 cm     Diastology LVIDs:         2.90 cm     LV e' medial:    6.42 cm/s LV PW:         0.80 cm     LV E/e' medial:  17.3 LV IVS:        0.80 cm     LV e' lateral:   4.90 cm/s LVOT diam:     1.90 cm     LV E/e' lateral: 22.7 LV SV:         83 LV SV Index:   52 LVOT Area:     2.84 cm  LV Volumes (MOD) LV vol d, MOD A4C: 83.2 ml LV vol s, MOD A4C: 25.4 ml LV SV MOD A4C:     83.2 ml RIGHT VENTRICLE RV Basal diam:  3.00 cm RV S prime:     12.10 cm/s TAPSE (M-mode): 2.2 cm LEFT ATRIUM           Index        RIGHT ATRIUM           Index LA diam:      3.40 cm 2.14 cm/m   RA Area:     11.90 cm LA Vol (A2C): 24.3 ml 15.29 ml/m  RA Volume:   28.90 ml  18.18 ml/m LA Vol (A4C): 42.0 ml 26.43 ml/m  AORTIC VALVE AV Area (Vmax):    1.90 cm AV Area (Vmean):   2.08 cm AV Area (VTI):     1.87 cm AV Vmax:           209.00 cm/s AV Vmean:          132.000 cm/s AV VTI:            0.443 m AV Peak Grad:      17.5 mmHg AV Mean Grad:      8.0 mmHg LVOT Vmax:  140.00 cm/s LVOT Vmean:        97.000 cm/s LVOT VTI:          0.292 m LVOT/AV VTI ratio: 0.66  AORTA Ao Root diam: 2.40 cm MITRAL VALVE                TRICUSPID VALVE MV Area (PHT): 1.85 cm     TR Peak grad:   24.2 mmHg MV Decel Time: 410 msec     TR Vmax:        246.00 cm/s MV E velocity: 111.00 cm/s MV A velocity: 130.00 cm/s  SHUNTS MV E/A ratio:  0.85         Systemic VTI:  0.29 m                             Systemic Diam: 1.90 cm Dalton McleanMD Electronically signed by Franki Monte Signature Date/Time: 02/21/2021/4:22:21 PM    Final    VAS [place] LOWER EXTREMITY VENOUS (DVT)  Result Date: 02/23/2021  Lower Venous DVT Study Patient  Name:  ARACELYS GLADE  Date of Exam:   02/23/2021 Medical Rec #: [id number]     Accession #:    [id number] Date of Birth: [birth year]     Patient Gender: F Patient Age:   53 years Exam Location:  [facility] Procedure:      VAS [place] LOWER EXTREMITY VENOUS (DVT) Referring Phys: Colletta Maryland DIXON --------------------------------------------------------------------------------  Indications: Fever, septic joint and abscess in metacarpals in the right hand.  Comparison Study: No prior study Performing Technologist: Sharion Dove RVS  Examination Guidelines: A complete evaluation includes B-mode imaging, spectral Doppler, color Doppler, and power Doppler as needed of all accessible portions of each vessel. Bilateral testing is considered an integral part of a complete examination. Limited examinations for reoccurring indications may be performed as noted. The reflux portion of the exam is performed with the patient in reverse Trendelenburg.  +---------+---------------+---------+-----------+----------+--------------+ RIGHT    CompressibilityPhasicitySpontaneityPropertiesThrombus Aging +---------+---------------+---------+-----------+----------+--------------+ CFV      Full           Yes      Yes                                 +---------+---------------+---------+-----------+----------+--------------+ SFJ      Full                                                        +---------+---------------+---------+-----------+----------+--------------+ FV Prox  Full                                                        +---------+---------------+---------+-----------+----------+--------------+ FV Mid   Full                                                        +---------+---------------+---------+-----------+----------+--------------+ FV DistalFull                                                        +---------+---------------+---------+-----------+----------+--------------+  PFV       Full                                                        +---------+---------------+---------+-----------+----------+--------------+ POP      Full           Yes      Yes                                 +---------+---------------+---------+-----------+----------+--------------+ PTV      Full                                                        +---------+---------------+---------+-----------+----------+--------------+ PERO     Full                                                        +---------+---------------+---------+-----------+----------+--------------+   +---------+---------------+---------+-----------+----------+--------------+ LEFT     CompressibilityPhasicitySpontaneityPropertiesThrombus Aging +---------+---------------+---------+-----------+----------+--------------+ CFV      Full           Yes      Yes                                 +---------+---------------+---------+-----------+----------+--------------+ SFJ      Full                                                        +---------+---------------+---------+-----------+----------+--------------+ FV Prox  Full                                                        +---------+---------------+---------+-----------+----------+--------------+ FV Mid   Full                                                        +---------+---------------+---------+-----------+----------+--------------+ FV DistalFull                                                        +---------+---------------+---------+-----------+----------+--------------+ PFV      Full                                                        +---------+---------------+---------+-----------+----------+--------------+  POP      Full           Yes      Yes                                 +---------+---------------+---------+-----------+----------+--------------+ PTV      Full                                                         +---------+---------------+---------+-----------+----------+--------------+ PERO     Full                                                        +---------+---------------+---------+-----------+----------+--------------+     Summary: BILATERAL: - No evidence of deep vein thrombosis seen in the lower extremities, bilaterally. -No evidence of popliteal cyst, bilaterally.   *See table(s) above for measurements and observations. Electronically signed by Monica Martinez MD on 02/23/2021 at 11:48:18 AM.    Final    VAS [place] UPPER EXTREMITY VENOUS DUPLEX  Result Date: 02/23/2021 UPPER VENOUS STUDY  Patient Name:  ETRULIA ZARR  Date of Exam:   02/23/2021 Medical Rec #: [id number]     Accession #:    [id number] Date of Birth: October 06, 1950     Patient Gender: F Patient Age:   16 years Exam Location:  [facility] Procedure:      VAS [place] UPPER EXTREMITY VENOUS DUPLEX Referring Phys: Colletta Maryland DIXON --------------------------------------------------------------------------------  Indications: Fever, septic joint and abscess in metacarpals in the right hand Limitations: Bandages, line (left forearm). Comparison Study: No prior study Performing Technologist: Sharion Dove RVS  Right Findings: +----------+------------+---------+-----------+----------+-------+ RIGHT     CompressiblePhasicitySpontaneousPropertiesSummary +----------+------------+---------+-----------+----------+-------+ IJV           Full       Yes       Yes                      +----------+------------+---------+-----------+----------+-------+ Subclavian               Yes       Yes                      +----------+------------+---------+-----------+----------+-------+ Axillary                 Yes       Yes                      +----------+------------+---------+-----------+----------+-------+ Brachial      Full                                           +----------+------------+---------+-----------+----------+-------+ Radial        Full                                          +----------+------------+---------+-----------+----------+-------+ Ulnar  Full                                          +----------+------------+---------+-----------+----------+-------+ Cephalic      Full                                          +----------+------------+---------+-----------+----------+-------+ Basilic       Full                                          +----------+------------+---------+-----------+----------+-------+  Left Findings: +----------+------------+---------+-----------+----------+-----------------+ LEFT      CompressiblePhasicitySpontaneousProperties     Summary      +----------+------------+---------+-----------+----------+-----------------+ IJV           Full       Yes       Yes                                +----------+------------+---------+-----------+----------+-----------------+ Subclavian               Yes       Yes                                +----------+------------+---------+-----------+----------+-----------------+ Axillary                 Yes       Yes                                +----------+------------+---------+-----------+----------+-----------------+ Brachial      Full                                                    +----------+------------+---------+-----------+----------+-----------------+ Radial        Full                                                    +----------+------------+---------+-----------+----------+-----------------+ Ulnar         Full                                                    +----------+------------+---------+-----------+----------+-----------------+ Cephalic      Full                                                    +----------+------------+---------+-----------+----------+-----------------+ Basilic     Partial  Age Indeterminate +----------+------------+---------+-----------+----------+-----------------+  Summary:  Right: No evidence of deep vein thrombosis in the upper extremity.  Left: No evidence of deep vein thrombosis in the upper extremity. 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  *See table(s) above for measurements and observations.  Diagnosing physician: Monica Martinez MD Electronically signed by Monica Martinez MD on 02/23/2021 at 11:48:40 AM.    Final    [place] EKG SITE RITE  Result Date: 02/12/2021 If Site Rite image not attached, placement could not be confirmed due to current cardiac rhythm.    Discharge Exam: Vitals:   02/28/21 0901 02/28/21 1106  BP: 106/63 115/75  Pulse: 64 (!) 59  Resp: 18 17  Temp: 99 F (37.2 C) 98.9 F (37.2 C)  SpO2: 100% 100%   Vitals:   02/28/21 0510 02/28/21 0513 02/28/21 0901 02/28/21 1106  BP: 123/62  106/63 115/75  Pulse: 62  64 (!) 59  Resp: $Remo'18  18 17  'IcDai$ Temp: 99.2 F (37.3 C)  99 F (37.2 C) 98.9 F (37.2 C)  TempSrc: Oral  Oral Oral  SpO2: 99%  100% 100%  Weight:  52 kg    Height:        General: Pt is alert, awake, not in acute distress Cardiovascular: RRR, S1/S2 +, no rubs, no gallops Respiratory: CTA bilaterally, no wheezing, no rhonchi Abdominal: Soft, NT, ND, bowel sounds + Extremities: no edema, no cyanosis    The results of significant diagnostics from this hospitalization (including imaging, microbiology, ancillary and laboratory) are listed below for reference.     Microbiology: Recent Results (from the past 240 hour(s))  Blood Culture (routine x 2)     Status: None   Collection Time: 02/20/21  4:20 PM   Specimen: BLOOD  Result Value Ref Range Status   Specimen Description BLOOD PICC LINE  Final   Special Requests   Final    BOTTLES DRAWN AEROBIC AND ANAEROBIC Blood Culture adequate volume    Culture   Final    NO GROWTH 5 DAYS Performed at [facility], [address], [place], [place] [address]    Report Status 02/25/2021 FINAL  Final  Blood Culture (routine x 2)     Status: None   Collection Time: 02/20/21  5:13 PM   Specimen: BLOOD  Result Value Ref Range Status   Specimen Description BLOOD SITE NOT SPECIFIED  Final   Special Requests   Final    BOTTLES DRAWN AEROBIC AND ANAEROBIC Blood Culture adequate volume   Culture   Final    NO GROWTH 5 DAYS Performed at [facility], [place] [address], [place], [place] [address]    Report Status 02/25/2021 FINAL  Final  Resp Panel by RT-PCR (Flu A&B, Covid) Nasopharyngeal Swab     Status: None   Collection Time: 02/20/21  8:55 PM   Specimen: Nasopharyngeal Swab; Nasopharyngeal(NP) swabs in vial transport medium  Result Value Ref Range Status   SARS Coronavirus 2 by RT PCR NEGATIVE NEGATIVE Final    Comment: (NOTE) SARS-CoV-2 target nucleic acids are NOT DETECTED.  The SARS-CoV-2 RNA is generally detectable in upper respiratory specimens during the acute phase of infection. The lowest concentration of SARS-CoV-2 viral copies this assay can detect is 138 copies/mL. A negative result does not preclude SARS-Cov-2 infection and should not be used as the sole basis for treatment or other patient management decisions. A negative result may occur with  improper specimen collection/handling, submission of specimen other than nasopharyngeal swab, presence of viral mutation(s) within the areas targeted by this assay, and inadequate number of viral copies(<138 copies/mL). A negative result must be combined with clinical observations, patient history, and epidemiological information. The expected result is Negative.  Fact Sheet for Patients:  EntrepreneurPulse.com.au  Fact Sheet for Healthcare Providers:  IncredibleEmployment.be  This test is no t yet approved or cleared by the [place] FDA and  has been authorized for detection and/or diagnosis of SARS-CoV-2 by FDA under an Emergency Use Authorization (EUA). This EUA will remain  in effect (meaning this test can be used) for the duration of the COVID-19 declaration under Section 564(b)(1) of the Act, 21 U.S.C.section 360bbb-3(b)(1), unless the authorization is terminated  or revoked sooner.       Influenza A by PCR NEGATIVE NEGATIVE Final   Influenza B by PCR NEGATIVE NEGATIVE Final    Comment: (NOTE) The Xpert Xpress SARS-CoV-2/FLU/RSV plus assay is intended as an aid in the diagnosis of influenza from Nasopharyngeal swab specimens and should not be used as a sole basis for treatment. Nasal washings and aspirates are unacceptable for Xpert Xpress SARS-CoV-2/FLU/RSV testing.  Fact Sheet for Patients: EntrepreneurPulse.com.au  Fact Sheet for Healthcare Providers: IncredibleEmployment.be  This test is not yet approved or cleared by the [place] FDA and has been authorized for detection and/or diagnosis of SARS-CoV-2 by FDA under an Emergency Use Authorization (EUA). This EUA will remain in effect (meaning this test can be used) for the duration of the COVID-19 declaration under Section 564(b)(1) of the Act, 21 U.S.C. section 360bbb-3(b)(1), unless the authorization is terminated or revoked.  Performed at [facility], [place] [address], [place], [place] [address]   Culture, blood (Routine X 2) w Reflex to ID Panel     Status: None   Collection Time: 02/22/21 10:19 AM   Specimen: BLOOD LEFT HAND  Result Value Ref Range Status   Specimen Description BLOOD LEFT HAND  Final   Special Requests   Final    BOTTLES DRAWN AEROBIC AND ANAEROBIC Blood Culture results may not be optimal due to an inadequate volume of blood received in culture bottles   Culture   Final    NO GROWTH 5 DAYS Performed at [facility], [place] [address] [place], [place] [address]     Report Status 02/27/2021 FINAL  Final  Culture, blood (Routine X 2) w Reflex to ID Panel     Status: None   Collection Time: 02/22/21 10:26 AM   Specimen: BLOOD LEFT HAND  Result Value Ref Range Status   Specimen Description BLOOD LEFT HAND  Final   Special Requests   Final    BOTTLES DRAWN AEROBIC AND ANAEROBIC Blood Culture adequate volume   Culture   Final    NO GROWTH 5 DAYS Performed at [facility], [place] [address], [place], [place] [address]    Report Status 02/27/2021 FINAL  Final     Labs: BNP (last 3 results) Recent Labs    02/20/21 1620  BNP 6,546.5*   Basic Metabolic Panel: Recent Labs  Lab 02/23/21 0421 02/24/21 0500 02/25/21 0030 02/26/21 0444 02/27/21 0519 02/28/21 0325  NA 136 139 138 139 143 139  K 3.7 3.9 4.4 4.8 4.9 5.0  CL 102 103 107 108 115* 113*  CO2 $Re'26 29 24 24 'kcl$ 18* 19*  GLUCOSE 72 80 83 91 88 92  BUN $Re'12 12 9 11 'RBc$ 9  6*  CREATININE 0.77 0.86 0.77 1.24* 1.06* 0.80  CALCIUM 7.9* 8.1* 8.2* 8.5* 8.8* 8.8*  MG 1.4*  --  1.6*  --  1.8  --    Liver Function Tests: No results for input(s): AST, ALT, ALKPHOS, BILITOT, PROT, ALBUMIN in the last 168 hours. No results for input(s): LIPASE, AMYLASE in the last 168 hours. No results for input(s): AMMONIA in the last 168 hours. CBC: Recent Labs  Lab 02/22/21 1020 02/22/21 1500 02/24/21 0500 02/25/21 0030 02/26/21 0444 02/27/21 0519 02/28/21 0325  WBC  --    < > 5.3 6.2 6.8 7.2 6.8  NEUTROABS 3.4  --   --   --   --   --  3.9  HGB  --    < > 8.4* 8.8* 8.5* 8.7* 8.7*  HCT  --    < > 26.4* 28.0* 27.6* 28.6* 28.4*  MCV  --    < > 87.1 87.2 89.6 90.8 89.3  PLT  --    < > 203 216 229 255 243   < > = values in this interval not displayed.   Cardiac Enzymes: No results for input(s): CKTOTAL, CKMB, CKMBINDEX, TROPONINI in the last 168 hours. BNP: Invalid input(s): POCBNP CBG: Recent Labs  Lab 02/27/21 1103 02/27/21 1558 02/27/21 2159 02/28/21 0605 02/28/21 1110  GLUCAP 72 103* 110* 93 107*    D-Dimer No results for input(s): DDIMER in the last 72 hours. Hgb A1c No results for input(s): HGBA1C in the last 72 hours. Lipid Profile No results for input(s): CHOL, HDL, LDLCALC, TRIG, CHOLHDL, LDLDIRECT in the last 72 hours. Thyroid function studies No results for input(s): TSH, T4TOTAL, T3FREE, THYROIDAB in the last 72 hours.  Invalid input(s): FREET3 Anemia work up No results for input(s): VITAMINB12, FOLATE, FERRITIN, TIBC, IRON, RETICCTPCT in the last 72 hours. Urinalysis    Component Value Date/Time   COLORURINE COLORLESS (A) 02/20/2021 2031   APPEARANCEUR CLEAR 02/20/2021 2031   LABSPEC 1.014 02/20/2021 2031   PHURINE 7.0 02/20/2021 2031   GLUCOSEU NEGATIVE 02/20/2021 2031   HGBUR NEGATIVE 02/20/2021 2031   BILIRUBINUR NEGATIVE 02/20/2021 2031   KETONESUR NEGATIVE 02/20/2021 2031   PROTEINUR NEGATIVE 02/20/2021 2031   UROBILINOGEN 1.0 07/25/2009 0821   NITRITE NEGATIVE 02/20/2021 2031   LEUKOCYTESUR NEGATIVE 02/20/2021 2031   Sepsis Labs Invalid input(s): PROCALCITONIN,  WBC,  LACTICIDVEN Microbiology Recent Results (from the past 240 hour(s))  Blood Culture (routine x 2)     Status: None   Collection Time: 02/20/21  4:20 PM   Specimen: BLOOD  Result Value Ref Range Status   Specimen Description BLOOD PICC LINE  Final   Special Requests   Final    BOTTLES DRAWN AEROBIC AND ANAEROBIC Blood Culture adequate volume   Culture   Final    NO GROWTH 5 DAYS Performed at [facility], [place] [address] [place], [place] [address]    Report Status 02/25/2021 FINAL  Final  Blood Culture (routine x 2)     Status: None   Collection Time: 02/20/21  5:13 PM   Specimen: BLOOD  Result Value Ref Range Status   Specimen Description BLOOD SITE NOT SPECIFIED  Final   Special Requests   Final    BOTTLES DRAWN AEROBIC AND ANAEROBIC Blood Culture adequate volume   Culture   Final    NO GROWTH 5 DAYS Performed at [facility], [place] [address] [place], [place]  [address]    Report Status 02/25/2021 FINAL  Final  Resp Panel by RT-PCR (Flu A&B, Covid) Nasopharyngeal Swab     Status: None   Collection Time: 02/20/21  8:55 PM   Specimen: Nasopharyngeal Swab; Nasopharyngeal(NP) swabs in vial transport medium  Result Value Ref Range Status   SARS Coronavirus 2 by RT PCR NEGATIVE NEGATIVE Final    Comment: (NOTE) SARS-CoV-2 target nucleic acids are NOT DETECTED.  The SARS-CoV-2 RNA is generally detectable in upper respiratory specimens during the acute phase of infection. The lowest concentration of SARS-CoV-2 viral copies this assay can detect is 138 copies/mL. A negative result does not preclude SARS-Cov-2 infection and should not be used as the sole basis for treatment or other patient management decisions. A negative result may occur with  improper specimen collection/handling, submission of specimen other than nasopharyngeal swab, presence of viral mutation(s) within the areas targeted by this assay, and inadequate number of viral copies(<138 copies/mL). A negative result must be combined with clinical observations, patient history, and epidemiological information. The expected result is Negative.  Fact Sheet for Patients:  EntrepreneurPulse.com.au  Fact Sheet for Healthcare Providers:  IncredibleEmployment.be  This test is no t yet approved or cleared by the [place] FDA and  has been authorized for detection and/or diagnosis of SARS-CoV-2 by FDA under an Emergency Use Authorization (EUA). This EUA will remain  in effect (meaning this test can be used) for the duration of the COVID-19 declaration under Section 564(b)(1) of the Act, 21 U.S.C.section 360bbb-3(b)(1), unless the authorization is terminated  or revoked sooner.       Influenza A by PCR NEGATIVE NEGATIVE Final   Influenza B by PCR NEGATIVE NEGATIVE Final    Comment: (NOTE) The Xpert Xpress SARS-CoV-2/FLU/RSV plus assay is intended as an  aid in the diagnosis of influenza from Nasopharyngeal swab specimens and should not be used as a sole basis for treatment. Nasal washings and aspirates are unacceptable for Xpert Xpress SARS-CoV-2/FLU/RSV testing.  Fact Sheet for Patients: EntrepreneurPulse.com.au  Fact Sheet for Healthcare Providers: IncredibleEmployment.be  This test is not yet approved or cleared by the [place] FDA and has been authorized for detection and/or diagnosis of SARS-CoV-2 by FDA under an Emergency Use Authorization (EUA). This EUA will remain in effect (meaning this test can be used) for the duration of the COVID-19 declaration under Section 564(b)(1) of the Act, 21 U.S.C. section 360bbb-3(b)(1), unless the authorization is terminated or revoked.  Performed at [facility], [place] [address], [place], [place] [address]   Culture, blood (Routine X 2) w Reflex to ID Panel     Status: None   Collection Time: 02/22/21 10:19 AM   Specimen: BLOOD LEFT HAND  Result Value Ref Range Status   Specimen Description BLOOD LEFT HAND  Final   Special Requests   Final    BOTTLES DRAWN AEROBIC AND ANAEROBIC Blood Culture results may not be optimal due to an inadequate volume of blood received in culture bottles   Culture   Final    NO GROWTH 5 DAYS Performed at [facility], [place] [address] [place], [place] [address]    Report Status 02/27/2021 FINAL  Final  Culture, blood (Routine X 2) w Reflex to ID Panel     Status: None   Collection Time: 02/22/21 10:26 AM   Specimen: BLOOD LEFT HAND  Result Value Ref Range Status   Specimen Description BLOOD LEFT HAND  Final   Special Requests   Final    BOTTLES DRAWN AEROBIC AND ANAEROBIC  Blood Culture adequate volume   Culture   Final    NO GROWTH 5 DAYS Performed at [facility], [place] [address], [place], [place] [address]    Report Status 02/27/2021 FINAL  Final     Time coordinating discharge: Over 30  minutes  SIGNED:   Darliss Cheney, MD  [facility] 02/28/2021, 11:26 AM  If 7PM-7AM, please contact night-coverage [email or web address]

## 2021-02-28 NOTE — Evaluation (Signed)
Occupational Therapy Evaluation Patient Details Name: Christy Higgins MRN: [id number] DOB: [birth year] Today's Date: 02/28/2021   History of Present Illness Pt is a 70 y.o. female who presented 02/20/21 with SOB secondary to acute on chronic diastolic congestive heart failure/elevated troponin. S/p EGD and colonoscopy 11/9. Stay complicated by worsening anemia suspected from GI bleed. Of note she was recently admitted from 10/19 until 10/26 with cellulitis and septic joint, s/p serial I&D on 10/19, 10/21, and 10/24. PMH: cellulitis, diabetes, hypertension, hyperlipidemia   Clinical Impression   Patient admitted for the diagnosis and subsequent procedure above.  She will need aggressive outpatient hand therapy for the foreseeable future.  The plan is to discharge home with assist as needed from her spouse. OT will follow if she remains in the acute setting to continue efforts with respect to hand rehab.         Recommendations for follow up therapy are one component of a multi-disciplinary discharge planning process, led by the attending physician.  Recommendations may be updated based on patient status, additional functional criteria and insurance authorization.   Follow Up Recommendations  Outpatient OT    Assistance Recommended at Discharge Set up Supervision/Assistance  Functional Status Assessment  Patient has had a recent decline in their functional status and demonstrates the ability to make significant improvements in function in a reasonable and predictable amount of time.  Equipment Recommendations  None recommended by OT    Recommendations for Other Services       Precautions / Restrictions Precautions Precautions: None Restrictions Weight Bearing Restrictions: No RUE Weight Bearing: Weight bearing as tolerated      Mobility Bed Mobility Overal bed mobility: Modified Independent             General bed mobility comments: Performs all bed mobility aspects without  assistance.    Transfers Overall transfer level: Independent Equipment used: None               General transfer comment: Able to come to stand without LOB or safety concerns.      Balance Overall balance assessment: No apparent balance deficits (not formally assessed)                                         ADL either performed or assessed with clinical judgement   ADL                                         General ADL Comments: Patient has limited grip to R hand, will need assist for various ADL and IADL.     Vision Baseline Vision/History: 1 Wears glasses Patient Visual Report: No change from baseline       Perception Perception Perception: Not tested   Praxis Praxis Praxis: Not tested    Pertinent Vitals/Pain Pain Assessment: Faces Faces Pain Scale: Hurts even more Pain Location: R hand with AROM to hand, wrist and forearm Pain Descriptors / Indicators: Discomfort;Grimacing;Guarding Pain Intervention(s): Monitored during session     Hand Dominance Right   Extremity/Trunk Assessment Upper Extremity Assessment Upper Extremity Assessment: RUE deficits/detail RUE Deficits / Details: Limited AROM and PROM to hand, wrist and forearm s/p I&D on 10/19 RUE Sensation: WNL RUE Coordination: decreased fine motor   Lower Extremity Assessment Lower Extremity Assessment: Defer to PT  evaluation   Cervical / Trunk Assessment Cervical / Trunk Assessment: Normal   Communication Communication Communication: No difficulties   Cognition Arousal/Alertness: Awake/alert Behavior During Therapy: WFL for tasks assessed/performed Overall Cognitive Status: Within Functional Limits for tasks assessed                                       General Comments  Educated pt on gentle static circular soft tissue mobilization to hand and scar to break up scar tissue. Educated pt on AAROM stretching of hand and wrist     Exercises Exercises: Other exercises Hand Exercises Forearm Supination: AROM Forearm Pronation: AROM Wrist Flexion: AROM;PROM Wrist Extension: AROM;PROM Composite Extension: AROM;PROM Digit Composite Abduction: PROM;AROM Other Exercises Other Exercises: AAROM R wrist and fingers extension/flexion Other Exercises: Gentle scar tissue mobilization R hand   Shoulder Instructions      Home Living Family/patient expects to be discharged to:: Private residence Living Arrangements: Spouse/significant other Available Help at Discharge: Family;Available 24 hours/day Type of Home: House Home Access: Stairs to enter [employer] of Steps: 4 Entrance Stairs-Rails: None Home Layout: One level     Bathroom Shower/Tub: Producer, television/film/video: Standard     Home Equipment: Shower seat          Prior Functioning/Environment Prior Level of Function : Independent/Modified Independent;Driving;Working/employed             Mobility Comments: Pt independent without AD. 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        OT Problem List: Decreased strength;Decreased range of motion;Impaired UE functional use;Pain      OT Treatment/Interventions:      OT Goals(Current goals can be found in the care plan section) Acute Rehab OT Goals Patient Stated Goal: hoping to go back to work in the near future OT Goal Formulation: With patient Time For Goal Achievement: 03/07/21 Potential to Achieve Goals: Good  OT Frequency:     Barriers to D/C:  None noted          Co-evaluation              AM-PAC OT "6 Clicks" Daily Activity     Outcome Measure Help from another person eating meals?: None Help from another person taking care of personal grooming?: A Little Help from another person toileting, which includes using toliet, bedpan, or urinal?: None Help from another person bathing (including washing, rinsing, drying)?: None Help from another person to put on  and taking off regular upper body clothing?: A Little Help from another person to put on and taking off regular lower body clothing?: A Little 6 Click Score: 21   End of Session    Activity Tolerance: Patient tolerated treatment well Patient left: in chair;with call bell/phone within reach  OT Visit Diagnosis: Pain Pain - Right/Left: Right Pain - part of body: Hand                Time: 0820-0839 OT Time Calculation (min): 19 min Charges:  OT General Charges $OT Visit: 1 Visit OT Evaluation $OT Eval Moderate Complexity: 1 Mod  02/28/2021  RP, OTR/L  Acute Rehabilitation Services  Office:  [phone]   Suzanna Obey 02/28/2021, 8:44 AM

## 2021-02-28 NOTE — Evaluation (Addendum)
Physical Therapy Evaluation/Discharge Patient Details Name: Christy Higgins MRN: [id number] DOB: 10/25/1950 Today's Date: 02/28/2021  History of Present Illness  Pt is a 70 y.o. female who presented 02/20/21 with SOB secondary to acute on chronic diastolic congestive heart failure/elevated troponin. S/p EGD and colonoscopy 11/9. Stay complicated by worsening anemia suspected from GI bleed. Of note she was recently admitted from 10/19 until 10/26 with cellulitis and septic joint, s/p serial I&D on 10/19, 10/21, and 10/24. PMH: cellulitis, diabetes, hypertension, hyperlipidemia   Clinical Impression  Pt presents with condition above and deficits mentioned below, see PT Problem List. PTA, she was independent, working in house cleaning. Currently, pt displays [facility] lower extremity strength and balance and no significant gait deviations. Pt appears to be functioning at her baseline other than displaying limitations in R wrist, hand, and fingers ROM along with edema and crepitus noted s/p multiple I&D. Educated pt on gentle scar tissue mobilization and AAROM to address deficits. All education completed and questions answered. PT will sign off. Likely will need outpatient OT to address her R hand deficits.     Recommendations for follow up therapy are one component of a multi-disciplinary discharge planning process, led by the attending physician.  Recommendations may be updated based on patient status, additional functional criteria and insurance authorization.  Follow Up Recommendations No PT follow up    Assistance Recommended at Discharge None  Functional Status Assessment Patient has not had a recent decline in their functional status  Equipment Recommendations  None recommended by PT    Recommendations for Other Services OT consult     Precautions / Restrictions Precautions Precautions: None Restrictions Weight Bearing Restrictions: No      Mobility  Bed Mobility Overal bed mobility:  Modified Independent             General bed mobility comments: Performs all bed mobility aspects without assistance.    Transfers Overall transfer level: Independent Equipment used: None               General transfer comment: Able to come to stand without LOB or safety concerns.    Ambulation/Gait Ambulation/Gait assistance: Independent Gait Distance (Feet): 350 Feet Assistive device: None Gait Pattern/deviations: WFL(Within Functional Limits) Gait velocity: WFL Gait velocity interpretation: >2.62 ft/sec, indicative of community ambulatory   General Gait Details: Pt with no significant gait deviations and no LOB, even when cued to change speeds and head positions.  Stairs Stairs: Yes Stairs assistance: Min guard;Supervision Stair Management: One rail Left;No rails;Alternating pattern;Step to pattern;Forwards Number of Stairs: 10 General stair comments: Ascends initially with L UE support on rail but progressed to no UE support, reciprocal pattern, no LOB. Descends with no UE support and step to pattern, no LOB. Min guard-supervision for safety.  Wheelchair Mobility    Modified Rankin (Stroke Patients Only)       Balance Overall balance assessment: No apparent balance deficits (not formally assessed)                                           Pertinent Vitals/Pain Pain Assessment: Faces Faces Pain Scale: Hurts little more Pain Location: R hand with mobility Pain Descriptors / Indicators: Discomfort;Grimacing;Guarding Pain Intervention(s): Limited activity within patient's tolerance;Monitored during session;Repositioned    Home Living Family/patient expects to be discharged to:: Private residence Living Arrangements: Spouse/significant other Available Help at Discharge: Family;Available 24 hours/day Type  of Home: House Home Access: Stairs to enter Entrance Stairs-Rails: None Entrance Stairs-Number of Steps: 4 (short)   Home Layout:  One level Home Equipment: Shower seat      Prior Function Prior Level of Function : Independent/Modified Independent;Driving;Working/employed             Mobility Comments: Pt independent without AD. 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     Hand Dominance        Extremity/Trunk Assessment   Upper Extremity Assessment Upper Extremity Assessment: Defer to OT evaluation (R wrist and hand ROM limitations with noted edema and crepitus with mobility s/p multiple I&D)    Lower Extremity Assessment Lower Extremity Assessment: Overall WFL for tasks assessed    Cervical / Trunk Assessment Cervical / Trunk Assessment: Normal  Communication   Communication: No difficulties  Cognition Arousal/Alertness: Awake/alert Behavior During Therapy: WFL for tasks assessed/performed Overall Cognitive Status: Within Functional Limits for tasks assessed                                          General Comments General comments (skin integrity, edema, etc.): Educated pt on gentle static circular soft tissue mobilization to hand and scar to break up scar tissue. Educated pt on AAROM stretching of hand and wrist    Exercises Other Exercises Other Exercises: AAROM R wrist and fingers extension/flexion Other Exercises: Gentle scar tissue mobilization R hand   Assessment/Plan    PT Assessment Patient does not need any further PT services  PT Problem List         PT Treatment Interventions      PT Goals (Current goals can be found in the Care Plan section)  Acute Rehab PT Goals Patient Stated Goal: to get back to work PT Goal Formulation: All assessment and education complete, DC therapy Time For Goal Achievement: 03/01/21 Potential to Achieve Goals: Good    Frequency     Barriers to discharge        Co-evaluation               AM-PAC PT "6 Clicks" Mobility  Outcome Measure Help needed turning from your back to your side while in a flat bed without  using bedrails?: None Help needed moving from lying on your back to sitting on the side of a flat bed without using bedrails?: None Help needed moving to and from a bed to a chair (including a wheelchair)?: None Help needed standing up from a chair using your arms (e.g., wheelchair or bedside chair)?: None Help needed to walk in hospital room?: None Help needed climbing 3-5 steps with a railing? : A Little 6 Click Score: 23    End of Session   Activity Tolerance: Patient tolerated treatment well Patient left: in bed;with call bell/phone within reach;Other (comment) (with OT)   PT Visit Diagnosis: Pain;Other (comment) (ROM deficits) Pain - Right/Left: Right Pain - part of body: Hand    Time: 9798-9211 PT Time Calculation (min) (ACUTE ONLY): 15 min   Charges:   PT Evaluation $PT Eval Low Complexity: 1 Low          Raymond Gurney, PT, DPT Acute Rehabilitation Services  Pager: [phone] Office: [phone]   Jewel Baize 02/28/2021, 8:28 AM

## 2021-02-28 NOTE — Progress Notes (Signed)
Mobility Specialist Progress Note:   02/28/21 1020  Mobility  Activity Ambulated in hall  Level of Assistance Standby assist, set-up cues, supervision of patient - no hands on  Assistive Device None  Distance Ambulated (ft) 520 ft  Mobility Ambulated with assistance in hallway  Mobility Response Tolerated well  Mobility performed by Mobility specialist  $Mobility charge 1 Mobility   Pt received in bed willing to participate in mobility. No complaints of pain and asymptomatic. Pt returned to bed with call bell in reach and all needs met.  [facility] Health and safety inspector Phone [phone]

## 2021-02-28 NOTE — Progress Notes (Signed)
[facility] for Infectious Disease  Date of Admission:  02/20/2021           Reason for visit: Follow up on right wrist septic arthritis  Current antibiotics: Ceftriaxone Linezolid  ASSESSMENT:    70 y.o. female admitted with:  Right wrist septic arthritis: Status post serial I&D on 10/19, 10/21, and 10/24 with negative cultures (few gram-positive cocci noted on gram stain 10/19; received Bactrim prior to surgery).  She was discharged 10/26 on daptomycin and ceftriaxone for planned 6-week course.  However she was readmitted 11/2 with fevers, shortness of breath, and cough.  Query the possibility of drug fever with daptomycin.  Her shortness of breath and cough were attributed to high-output CHF, however, daptomycin induced eosinophilic pneumonia pneumonitis not entirely excluded but felt to be less likely.  Repeat MRI of the wrist showed a tiny fluid collection but orthopedics has no plans for further procedures and her sutures were removed during this admission.  She is now on ceftriaxone and linezolid as vancomycin was discontinued due to a bump in her creatinine. Anemia: Status post EGD and colonoscopy 11/9 AKI: Creatinine has improved over the last couple days with a creatinine clearance today of 47.7 Diabetes: Recent hemoglobin A1c 6.4  RECOMMENDATIONS:    Continue ceftriaxone as planned through 12/5 Continue linezolid through 11/21 then doxycycline starting 11/22 through 12/5.  See OP AT note from pharmacist Jimmy Footman on 11/8.  Orders have been placed. Follow-up with Dr. Tommy Medal scheduled for next week. Will sign off, please call as needed.   Principal Problem:   Septic arthritis ([place]) Active Problems:   Acute CHF (congestive heart failure) (HCC)   Demand ischemia (HCC)   HTN (hypertension)   DM (diabetes mellitus) ([place])   Acute respiratory failure with hypoxia (HCC)   CHF (congestive heart failure) (HCC)   NSTEMI (non-ST elevated myocardial infarction)  ([place])   Hand abscess   Fever   Eosinophilic pneumonia (HCC)   Acute blood loss anemia   Diverticulosis of colon without hemorrhage   Heme positive stool   Normocytic anemia   Gastritis    MEDICATIONS:    Scheduled Meds:  atorvastatin  80 mg Oral Daily   Chlorhexidine Gluconate Cloth  6 each Topical Daily   heparin injection (subcutaneous)  5,000 Units Subcutaneous Q8H   hydrocerin   Topical BID   insulin aspart  0-9 Units Subcutaneous TID WC   linezolid  600 mg Oral Q12H   metoprolol tartrate  12.5 mg Oral BID    morphine injection  1 mg Intravenous Once   nystatin  5 mL Oral QID   pantoprazole  40 mg Oral BID   potassium chloride  20 mEq Oral BID   sodium chloride flush  3 mL Intravenous Q12H   sodium chloride flush  3 mL Intravenous Q12H   Continuous Infusions:  sodium chloride Stopped (02/27/21 2329)   cefTRIAXone (ROCEPHIN)  IV Stopped (02/27/21 2202)   PRN Meds:.sodium chloride, acetaminophen **OR** acetaminophen, polyethylene glycol, traMADol  SUBJECTIVE:   24 hour events:  Status post EGD/colonoscopy No other acute events noted  Patient states she is feeling well today.  No new complaints.  Tolerated her procedures well yesterday.  She says she will be working with occupational therapy at discharge.  Review of Systems  All other systems reviewed and are negative.    OBJECTIVE:   Blood pressure 115/75, pulse (!) 59, temperature 98.9 F (37.2 C), temperature source Oral, resp. rate 17,  height 5' (1.524 m), weight 52 kg, SpO2 100 %. Body mass index is 22.38 kg/m.  Physical Exam Constitutional:      Appearance: Normal appearance.  HENT:     Head: Normocephalic and atraumatic.  Eyes:     Extraocular Movements: Extraocular movements intact.     Conjunctiva/sclera: Conjunctivae normal.  Pulmonary:     Effort: Pulmonary effort is normal. No respiratory distress.  Musculoskeletal:     Comments: Right wrist sutures removed and incision healing without  dehiscence.  Skin:    General: Skin is warm and dry.  Neurological:     General: No focal deficit present.     Mental Status: She is alert and oriented to person, place, and time.  Psychiatric:        Mood and Affect: Mood normal.        Behavior: Behavior normal.     Lab Results: Lab Results  Component Value Date   WBC 6.8 02/28/2021   HGB 8.7 (L) 02/28/2021   HCT 28.4 (L) 02/28/2021   MCV 89.3 02/28/2021   PLT 243 02/28/2021    Lab Results  Component Value Date   NA 139 02/28/2021   K 5.0 02/28/2021   CO2 19 (L) 02/28/2021   GLUCOSE 92 02/28/2021   BUN 6 (L) 02/28/2021   CREATININE 0.80 02/28/2021   CALCIUM 8.8 (L) 02/28/2021   GFRNONAA >60 02/28/2021   GFRAA >60 08/04/2016    Lab Results  Component Value Date   ALT 28 02/21/2021   AST 43 (H) 02/21/2021   ALKPHOS 59 02/21/2021   BILITOT 1.0 02/21/2021       Component Value Date/Time   CRP 12.3 (H) 02/21/2021 1316       Component Value Date/Time   ESRSEDRATE 26 (H) 03/06/2016 0816     I have reviewed the micro and lab results in Epic.  Imaging: No results found.   Imaging independently reviewed in Epic.    Vedia Coffer for Infectious Disease [facility] [phone] pager 02/28/2021, 11:17 AM  I spent greater than 35 minutes with the patient including greater than 50% of time in face to face counsel of the patient and in coordination of their care.

## 2021-02-28 NOTE — Progress Notes (Signed)
Heart Failure Nurse Navigator Progress Note  Pt declined HV TOC clinic appt upon DC from hospitalization. Pt educated on benefits of HV TOC, pt will follow with [facility] Cardiology. Pt education complete regarding HF patient booklet.    Ozella Rocks, MSN, RN Heart Failure Nurse Navigator [phone]

## 2021-02-28 NOTE — TOC Benefit Eligibility Note (Signed)
Patient Product/process development scientist completed.    The patient is currently admitted and upon discharge could be taking linezolid (Zyvox) 600 mg.  The current 10 day co-pay is, $5.00.   The patient is insured through [facility] [employer]     Roland Earl, CPhT Pharmacy Patient Advocate Specialist [facility] Pharmacy Patient Advocate Team Direct Number: [phone]  Fax: [phone]

## 2021-02-28 NOTE — TOC Progression Note (Addendum)
Transition of Care [facility]) - Progression Note    Patient Details  Name: Christy Higgins MRN: [id number] Date of Birth: April 27, 1950  Transition of Care [facility]) CM/SW Contact  Lockie Pares, RN Phone Number: 02/28/2021, 9:24 AM  Clinical Narrative:     Outpatient referral for PT placed OPAT orders in, Patient will need a PICC for IV home antibiotics. She has had recent previous IV antibiotics with advanced home infusion. Called Pam from [facility]. She is looking at orders and will call back to make sure it can be performed today. Husband has experience with IV Abx form last time.        Expected Discharge Plan and Services    Home with PICC and IV Abx [facility]       Expected Discharge Date: 02/28/21                                     Social Determinants of Health (SDOH) Interventions    Readmission Risk Interventions No flowsheet data found.

## 2021-03-02 ENCOUNTER — Encounter: Payer: Self-pay | Admitting: Gastroenterology

## 2021-03-05 ENCOUNTER — Encounter: Payer: Self-pay | Admitting: Infectious Disease

## 2021-03-06 ENCOUNTER — Ambulatory Visit: Payer: BC Managed Care – PPO | Admitting: Infectious Disease

## 2021-03-06 ENCOUNTER — Encounter: Payer: Self-pay | Admitting: Infectious Disease

## 2021-03-06 ENCOUNTER — Other Ambulatory Visit: Payer: Self-pay

## 2021-03-06 VITALS — BP 157/89 | HR 86 | Temp 98.5°F | Ht 60.0 in | Wt 114.0 lb

## 2021-03-06 DIAGNOSIS — I151 Hypertension secondary to other renal disorders: Secondary | ICD-10-CM | POA: Diagnosis not present

## 2021-03-06 DIAGNOSIS — L02519 Cutaneous abscess of unspecified hand: Secondary | ICD-10-CM | POA: Diagnosis not present

## 2021-03-06 DIAGNOSIS — N2889 Other specified disorders of kidney and ureter: Secondary | ICD-10-CM

## 2021-03-06 DIAGNOSIS — I509 Heart failure, unspecified: Secondary | ICD-10-CM

## 2021-03-06 DIAGNOSIS — J8281 Chronic eosinophilic pneumonia: Secondary | ICD-10-CM

## 2021-03-06 MED ORDER — DOXYCYCLINE HYCLATE 100 MG PO CAPS
100.0000 mg | ORAL_CAPSULE | Freq: Two times a day (BID) | ORAL | 0 refills | Status: AC
Start: 2021-03-12 — End: 2021-03-25

## 2021-03-06 NOTE — Patient Instructions (Signed)
Continue to take the IV ceftriaxone daily AND the linezolid zyvox (600mg ) tablets through November 21st  Then take Ceftriaxone IV and doxycycline 100mg  twice daily until visit on 12/5

## 2021-03-06 NOTE — Progress Notes (Signed)
Subjective:  Chief complaint still with some hand pain  Patient ID: Christy Higgins, female    DOB: [birth year], 70 y.o.   MRN: [id number]  HPI  Christy Higgins is a 70 year old black woman who was recently hospitalized with right hand abscess and septic arthritis status post I&D on October 19 21st and 24th.  Cultures were negative but likely compromised by her having been on oral Bactrim prior to surgery.  She then was discharged on daptomycin and ceftriaxone with plans for 6 weeks of antibiotics.  She was readmitted with fevers shortness of breath and cough.  Due to the possibility that that she could be having a drug fever and eosinophilic pneumonia from daptomycin this drug was stopped and it was exchanged for vancomycin.  However it was felt more likely that her shortness of breath and cough were due to have high-output heart failure.  While in the hospital she had repeat MRI that showed of the wrist showed a tiny fluid collection.  Orthopedic surgery did not feel there is any need for further intervention and her sutures were removed.  She was on ceftriaxone and vancomycin but had acute Kindrick kidney injury and was switched over to ceftriaxone and linezolid.  Plans were to continue these 2 antibiotics through 21 November and then exchange linezolid for doxycycline.  She says her hand pain is improved dramatically versus when she was in the hospital.  She seems unaware of when she has a hospital follow-up with her orthopedic surgeon.  She also appears to be taking both doxycycline and the Zyvox I have asked her stop the doxycycline for now until she gets to the 21st and then started again.   Past Medical History:  Diagnosis Date  . Diabetes mellitus   . High cholesterol   . Hypertension     Past Surgical History:  Procedure Laterality Date  . BIOPSY  02/27/2021   Procedure: BIOPSY;  Surgeon: Lavena Bullion, DO;  Location: [facility];  Service: Gastroenterology;;  . CESAREAN SECTION     . COLONOSCOPY Left 02/27/2021   Procedure: COLONOSCOPY;  Surgeon: Lavena Bullion, DO;  Location: [place];  Service: Gastroenterology;  Laterality: Left;  . ESOPHAGOGASTRODUODENOSCOPY Left 02/27/2021   Procedure: ESOPHAGOGASTRODUODENOSCOPY (EGD);  Surgeon: Lavena Bullion, DO;  Location: [facility] ENDOSCOPY;  Service: Gastroenterology;  Laterality: Left;  . I & D EXTREMITY Right 02/06/2021   Procedure: IRRIGATION AND DEBRIDEMENT RIGHT HAND;  Surgeon: Iran Planas, MD;  Location: [place];  Service: Orthopedics;  Laterality: Right;  . I & D EXTREMITY Right 02/08/2021   Procedure: IRRIGATION AND DEBRIDEMENT EXTREMITY;  Surgeon: Orene Desanctis, MD;  Location: [place];  Service: Orthopedics;  Laterality: Right;  . I & D EXTREMITY Right 02/11/2021   Procedure: IRRIGATION AND DEBRIDEMENT RIGHT WRIST;  Surgeon: Iran Planas, MD;  Location: [place];  Service: Orthopedics;  Laterality: Right;  . TONSILLECTOMY      Family History  Problem Relation Age of Onset  . Hypertension Mother   . Diabetes Father   . Hypertension Father   . Hypertension Sister   . Diabetes Sister   . Hypertension Sister   . Diabetes Sister   . Hypertension Sister   . Diabetes Sister       Social History   Socioeconomic History  . Marital status: Married    Spouse name: Not on file  . Number of children: 1  . Years of education: Not on file  . Highest education level: Not on file  Occupational  History  . Not on file  Tobacco Use  . Smoking status: Never  . Smokeless tobacco: Never  Substance and Sexual Activity  . Alcohol use: Yes    Comment: beer occasionally  . Drug use: No  . Sexual activity: Not on file  Other Topics Concern  . Not on file  Social History Narrative  . Not on file   Social Determinants of Health   Financial Resource Strain: Not on file  Food Insecurity: Not on file  Transportation Needs: Not on file  Physical Activity: Not on file  Stress: Not on file  Social Connections: Not on  file    No Known Allergies   Current Outpatient Medications:  .  acetaminophen-codeine (TYLENOL #3) 300-30 MG tablet, Take 1 tablet by mouth every 4 (four) hours as needed for moderate pain., Disp: , Rfl:  .  atorvastatin (LIPITOR) 40 MG tablet, Take 1 tablet (40 mg total) by mouth daily., Disp: 30 tablet, Rfl: 0 .  cefTRIAXone (ROCEPHIN) IVPB, Inject 2 g into the vein daily for 27 days. Indication:  Septic Arthritis First Dose: Yes Last Day of Therapy:  03/25/21 Labs - Once weekly:  CBC/D and BMP, Labs - Every other week:  ESR and CRP Method of administration: IV Push Method of administration may be changed at the discretion of home infusion pharmacist based upon assessment of the patient and/or caregiver's ability to self-administer the medication ordered., Disp: 27 Units, Rfl: 0 .  [START ON 03/12/2021] doxycycline (VIBRAMYCIN) 100 MG capsule, Take 1 capsule (100 mg total) by mouth 2 (two) times daily for 13 days. Start 03/12/21, Disp: 26 capsule, Rfl: 0 .  HYDROcodone-acetaminophen (NORCO/VICODIN) 5-325 MG tablet, Take 1 tablet by mouth every 6 (six) hours as needed for moderate pain., Disp: , Rfl:  .  linezolid (ZYVOX) 600 MG tablet, Take 1 tablet (600 mg total) by mouth 2 (two) times daily for 11 days., Disp: 22 tablet, Rfl: 0 .  losartan (COZAAR) 100 MG tablet, Take 100 mg by mouth daily., Disp: , Rfl:  .  meloxicam (MOBIC) 7.5 MG tablet, Take 7.5 mg by mouth daily., Disp: , Rfl:  .  metFORMIN (GLUCOPHAGE-XR) 750 MG 24 hr tablet, Take 750 mg by mouth every evening., Disp: , Rfl:  .  pantoprazole (PROTONIX) 40 MG tablet, Take 1 tablet (40 mg total) by mouth 2 (two) times daily., Disp: 84 tablet, Rfl: 0 .  traMADol (ULTRAM) 50 MG tablet, Take 50 mg by mouth every 6 (six) hours as needed for moderate pain., Disp: , Rfl:     Review of Systems  Constitutional:  Negative for activity change, appetite change, chills, diaphoresis, fatigue, fever and unexpected weight change.  HENT:  Negative for  congestion, rhinorrhea, sinus pressure, sneezing, sore throat and trouble swallowing.   Eyes:  Negative for photophobia and visual disturbance.  Respiratory:  Negative for cough, chest tightness, shortness of breath, wheezing and stridor.   Cardiovascular:  Negative for chest pain, palpitations and leg swelling.  Gastrointestinal:  Negative for abdominal distention, abdominal pain, anal bleeding, blood in stool, constipation, diarrhea, nausea and vomiting.  Genitourinary:  Negative for difficulty urinating, dysuria, flank pain and hematuria.  Musculoskeletal:  Negative for arthralgias, back pain, gait problem, joint swelling and myalgias.  Skin:  Positive for wound. Negative for color change, pallor and rash.  Neurological:  Negative for dizziness, tremors, weakness and light-headedness.  Hematological:  Negative for adenopathy. Does not bruise/bleed easily.  Psychiatric/Behavioral:  Negative for agitation, behavioral problems, confusion, decreased  concentration, dysphoric mood and sleep disturbance.       Objective:   Physical Exam Constitutional:      General: She is not in acute distress.    Appearance: Normal appearance. She is well-developed. She is not ill-appearing or diaphoretic.  HENT:     Head: Normocephalic and atraumatic.     Right Ear: Hearing and external ear normal.     Left Ear: Hearing and external ear normal.     Nose: No nasal deformity or rhinorrhea.  Eyes:     General: No scleral icterus.    Conjunctiva/sclera: Conjunctivae normal.     Right eye: Right conjunctiva is not injected.     Left eye: Left conjunctiva is not injected.     Pupils: Pupils are equal, round, and reactive to light.  Neck:     Vascular: No JVD.  Cardiovascular:     Rate and Rhythm: Normal rate and regular rhythm.     Heart sounds: S1 normal and S2 normal.  Pulmonary:     Effort: Pulmonary effort is normal. No respiratory distress.     Breath sounds: No wheezing.  Abdominal:     General:  Bowel sounds are normal. There is no distension.     Palpations: Abdomen is soft.     Tenderness: There is no abdominal tenderness.  Musculoskeletal:        General: Normal range of motion.     Right shoulder: Normal.     Left shoulder: Normal.     Cervical back: Normal range of motion and neck supple.     Right hip: Normal.     Left hip: Normal.     Right knee: Normal.     Left knee: Normal.  Lymphadenopathy:     Head:     Right side of head: No submandibular, preauricular or posterior auricular adenopathy.     Left side of head: No submandibular, preauricular or posterior auricular adenopathy.     Cervical: No cervical adenopathy.     Right cervical: No superficial or deep cervical adenopathy.    Left cervical: No superficial or deep cervical adenopathy.  Skin:    General: Skin is warm and dry.     Coloration: Skin is not pale.     Findings: No abrasion, bruising, ecchymosis, erythema, lesion or rash.     Nails: There is no clubbing.  Neurological:     General: No focal deficit present.     Mental Status: She is alert and oriented to person, place, and time.     Sensory: No sensory deficit.     Coordination: Coordination normal.     Gait: Gait normal.  Psychiatric:        Attention and Perception: She is attentive.        Mood and Affect: Mood normal.        Speech: Speech normal.        Behavior: Behavior normal. Behavior is cooperative.        Thought Content: Thought content normal.        Judgment: Judgment normal.    Right hand March 06, 2021:       PICC line        Assessment & Plan:   Right hand after abscess and septic arthritis:  I will Continue ceftriaxone through 5 December.  Continue Zyvox through the 21st of then exchanged for doxycycline.  I have reviewed labs from home health occluding BMP with GFR CBC differential  High output heart  failure: Follow-up with PCP and cardiology.  Vaccine counseling she is up-to-date on her vaccinations  and having had seasonal flu and updated COVID-19 booster.

## 2021-03-18 ENCOUNTER — Encounter: Payer: Self-pay | Admitting: Infectious Disease

## 2021-03-25 ENCOUNTER — Encounter: Payer: Self-pay | Admitting: Infectious Disease

## 2021-03-25 ENCOUNTER — Telehealth: Payer: Self-pay

## 2021-03-25 ENCOUNTER — Ambulatory Visit (INDEPENDENT_AMBULATORY_CARE_PROVIDER_SITE_OTHER): Payer: BC Managed Care – PPO | Admitting: Infectious Disease

## 2021-03-25 ENCOUNTER — Other Ambulatory Visit: Payer: Self-pay

## 2021-03-25 VITALS — BP 169/9 | HR 109 | Temp 98.4°F | Wt 115.0 lb

## 2021-03-25 DIAGNOSIS — M19031 Primary osteoarthritis, right wrist: Secondary | ICD-10-CM

## 2021-03-25 DIAGNOSIS — M25441 Effusion, right hand: Secondary | ICD-10-CM | POA: Diagnosis not present

## 2021-03-25 DIAGNOSIS — L02519 Cutaneous abscess of unspecified hand: Secondary | ICD-10-CM

## 2021-03-25 DIAGNOSIS — T50905A Adverse effect of unspecified drugs, medicaments and biological substances, initial encounter: Secondary | ICD-10-CM

## 2021-03-25 DIAGNOSIS — N2889 Other specified disorders of kidney and ureter: Secondary | ICD-10-CM

## 2021-03-25 DIAGNOSIS — D6959 Other secondary thrombocytopenia: Secondary | ICD-10-CM

## 2021-03-25 DIAGNOSIS — I5023 Acute on chronic systolic (congestive) heart failure: Secondary | ICD-10-CM

## 2021-03-25 DIAGNOSIS — E119 Type 2 diabetes mellitus without complications: Secondary | ICD-10-CM

## 2021-03-25 DIAGNOSIS — M009 Pyogenic arthritis, unspecified: Secondary | ICD-10-CM

## 2021-03-25 DIAGNOSIS — I151 Hypertension secondary to other renal disorders: Secondary | ICD-10-CM

## 2021-03-25 HISTORY — DX: Adverse effect of unspecified drugs, medicaments and biological substances, initial encounter: D69.59

## 2021-03-25 HISTORY — DX: Effusion, right hand: M25.441

## 2021-03-25 LAB — CBC WITH DIFFERENTIAL/PLATELET
Absolute Monocytes: 1028 cells/uL — ABNORMAL HIGH (ref 200–950)
Basophils Absolute: 29 cells/uL (ref 0–200)
Basophils Relative: 0.3 %
Eosinophils Absolute: 136 cells/uL (ref 15–500)
Eosinophils Relative: 1.4 %
HCT: 29.9 % — ABNORMAL LOW (ref 35.0–45.0)
Hemoglobin: 9.4 g/dL — ABNORMAL LOW (ref 11.7–15.5)
Lymphs Abs: 1455 cells/uL (ref 850–3900)
MCH: 26.9 pg — ABNORMAL LOW (ref 27.0–33.0)
MCHC: 31.4 g/dL — ABNORMAL LOW (ref 32.0–36.0)
MCV: 85.4 fL (ref 80.0–100.0)
MPV: 11.6 fL (ref 7.5–12.5)
Monocytes Relative: 10.6 %
Neutro Abs: 7052 cells/uL (ref 1500–7800)
Neutrophils Relative %: 72.7 %
Platelets: 160 10*3/uL (ref 140–400)
RBC: 3.5 10*6/uL — ABNORMAL LOW (ref 3.80–5.10)
RDW: 15.4 % — ABNORMAL HIGH (ref 11.0–15.0)
Total Lymphocyte: 15 %
WBC: 9.7 10*3/uL (ref 3.8–10.8)

## 2021-03-25 NOTE — Telephone Encounter (Signed)
Per Dr. Daiva Eves called Home health with verbal orders to pull picc. Spoke with Amy at [facility] who was able to take order. Patient is aware. Juanita Laster, RMA

## 2021-03-25 NOTE — Progress Notes (Signed)
Subjective:  Chief complaint some swelling in her finger  Patient ID: Christy Higgins, female    DOB: February 01, 1951, 70 y.o.   MRN: [id number]  HPI  Christy Higgins is a 70 year old black woman who was recently hospitalized with right hand abscess and septic arthritis status post I&D on October 19 21st and 24th.  Cultures were negative but likely compromised by her having been on oral Bactrim prior to surgery.  She then was discharged on daptomycin and ceftriaxone with plans for 6 weeks of antibiotics.  She was readmitted with fevers shortness of breath and cough.  Due to the possibility that that she could be having a drug fever and eosinophilic pneumonia from daptomycin this drug was stopped and it was exchanged for vancomycin.  However it was felt more likely that her shortness of breath and cough were due to have high-output heart failure.  While in the hospital she had repeat MRI that showed of the wrist showed a tiny fluid collection.  Orthopedic surgery did not feel there is any need for further intervention and her sutures were removed.  She was on ceftriaxone and vancomycin but had acute Kindrick kidney injury and was switched over to ceftriaxone and linezolid.  Plans were to continue these 2 antibiotics through 21 November and then exchange linezolid for doxycycline.  She says her hand pain improved dramatically versus when she was in the hospital when last saw her.  At that visit she appeared to beboth doxycycline and the Zyvox I have asked her stop the doxycycline for now until she gets to the 21st and then started again.  She did do this.  I have reviewed labs from home health and it did show that she developed some thrombocytopenia presumably from the Zyvox.  She has seen her hand surgeon who is happy with her progress.  Today's last day of her antibiotics.  The hand itself seems to have healed up well she has experienced some swelling of her middle finger over the last few days with swelling  up and then going back down again.     Past Medical History:  Diagnosis Date   Diabetes mellitus    High cholesterol    Hypertension     Past Surgical History:  Procedure Laterality Date   BIOPSY  02/27/2021   Procedure: BIOPSY;  Surgeon: Lavena Bullion, DO;  Location: [place] ENDOSCOPY;  Service: Gastroenterology;;   CESAREAN SECTION     COLONOSCOPY Left 02/27/2021   Procedure: COLONOSCOPY;  Surgeon: Lavena Bullion, DO;  Location: [place];  Service: Gastroenterology;  Laterality: Left;   ESOPHAGOGASTRODUODENOSCOPY Left 02/27/2021   Procedure: ESOPHAGOGASTRODUODENOSCOPY (EGD);  Surgeon: Lavena Bullion, DO;  Location: [facility] ENDOSCOPY;  Service: Gastroenterology;  Laterality: Left;   I & D EXTREMITY Right 02/06/2021   Procedure: IRRIGATION AND DEBRIDEMENT RIGHT HAND;  Surgeon: Iran Planas, MD;  Location: [place];  Service: Orthopedics;  Laterality: Right;   I & D EXTREMITY Right 02/08/2021   Procedure: IRRIGATION AND DEBRIDEMENT EXTREMITY;  Surgeon: Orene Desanctis, MD;  Location: [place];  Service: Orthopedics;  Laterality: Right;   I & D EXTREMITY Right 02/11/2021   Procedure: IRRIGATION AND DEBRIDEMENT RIGHT WRIST;  Surgeon: Iran Planas, MD;  Location: [place];  Service: Orthopedics;  Laterality: Right;   TONSILLECTOMY      Family History  Problem Relation Age of Onset   Hypertension Mother    Diabetes Father    Hypertension Father    Hypertension Sister    Diabetes Sister  Hypertension Sister    Diabetes Sister    Hypertension Sister    Diabetes Sister       Social History   Socioeconomic History   Marital status: Married    Spouse name: Not on file   Number of children: 1   Years of education: Not on file   Highest education level: Not on file  Occupational History   Not on file  Tobacco Use   Smoking status: Never   Smokeless tobacco: Never  Substance and Sexual Activity   Alcohol use: Yes    Comment: beer occasionally   Drug use: No   Sexual  activity: Not on file  Other Topics Concern   Not on file  Social History Narrative   Not on file   Social Determinants of Health   Financial Resource Strain: Not on file  Food Insecurity: Not on file  Transportation Needs: Not on file  Physical Activity: Not on file  Stress: Not on file  Social Connections: Not on file    No Known Allergies   Current Outpatient Medications:    acetaminophen-codeine (TYLENOL #3) 300-30 MG tablet, Take 1 tablet by mouth every 4 (four) hours as needed for moderate pain., Disp: , Rfl:    atorvastatin (LIPITOR) 40 MG tablet, Take 1 tablet (40 mg total) by mouth daily., Disp: 30 tablet, Rfl: 0   cefTRIAXone (ROCEPHIN) IVPB, Inject 2 g into the vein daily for 27 days. Indication:  Septic Arthritis First Dose: Yes Last Day of Therapy:  03/25/21 Labs - Once weekly:  CBC/D and BMP, Labs - Every other week:  ESR and CRP Method of administration: IV Push Method of administration may be changed at the discretion of home infusion pharmacist based upon assessment of the patient and/or caregiver's ability to self-administer the medication ordered., Disp: 27 Units, Rfl: 0   doxycycline (VIBRAMYCIN) 100 MG capsule, Take 1 capsule (100 mg total) by mouth 2 (two) times daily for 13 days. Start 03/12/21, Disp: 26 capsule, Rfl: 0   HYDROcodone-acetaminophen (NORCO/VICODIN) 5-325 MG tablet, Take 1 tablet by mouth every 6 (six) hours as needed for moderate pain., Disp: , Rfl:    losartan (COZAAR) 100 MG tablet, Take 100 mg by mouth daily., Disp: , Rfl:    meloxicam (MOBIC) 7.5 MG tablet, Take 7.5 mg by mouth daily., Disp: , Rfl:    metFORMIN (GLUCOPHAGE-XR) 750 MG 24 hr tablet, Take 750 mg by mouth every evening., Disp: , Rfl:    pantoprazole (PROTONIX) 40 MG tablet, Take 1 tablet (40 mg total) by mouth 2 (two) times daily., Disp: 84 tablet, Rfl: 0   traMADol (ULTRAM) 50 MG tablet, Take 50 mg by mouth every 6 (six) hours as needed for moderate pain., Disp: , Rfl:     Review  of Systems  Constitutional:  Negative for chills and fever.  HENT:  Negative for congestion and sore throat.   Eyes:  Negative for photophobia.  Respiratory:  Negative for cough, shortness of breath and wheezing.   Cardiovascular:  Negative for chest pain, palpitations and leg swelling.  Gastrointestinal:  Negative for abdominal pain, blood in stool, constipation, diarrhea, nausea and vomiting.  Genitourinary:  Negative for dysuria, flank pain and hematuria.  Musculoskeletal:  Positive for joint swelling. Negative for back pain and myalgias.  Skin:  Positive for wound. Negative for rash.  Neurological:  Negative for dizziness, weakness and headaches.  Hematological:  Does not bruise/bleed easily.  Psychiatric/Behavioral:  Negative for suicidal ideas.  Objective:   Physical Exam Constitutional:      General: She is not in acute distress.    Appearance: Normal appearance. She is well-developed. She is not ill-appearing or diaphoretic.  HENT:     Head: Normocephalic and atraumatic.     Right Ear: Hearing and external ear normal.     Left Ear: Hearing and external ear normal.     Nose: No nasal deformity or rhinorrhea.  Eyes:     General: No scleral icterus.    Conjunctiva/sclera: Conjunctivae normal.     Right eye: Right conjunctiva is not injected.     Left eye: Left conjunctiva is not injected.     Pupils: Pupils are equal, round, and reactive to light.  Neck:     Vascular: No JVD.  Cardiovascular:     Rate and Rhythm: Normal rate and regular rhythm.     Heart sounds: Normal heart sounds, S1 normal and S2 normal. No murmur heard.   No friction rub.  Abdominal:     General: Bowel sounds are normal. There is no distension.     Palpations: Abdomen is soft.     Tenderness: There is no abdominal tenderness.  Musculoskeletal:        General: Normal range of motion.     Right shoulder: Normal.     Left shoulder: Normal.     Cervical back: Normal range of motion and neck  supple.     Right hip: Normal.     Left hip: Normal.     Right knee: Normal.     Left knee: Normal.  Lymphadenopathy:     Head:     Right side of head: No submandibular, preauricular or posterior auricular adenopathy.     Left side of head: No submandibular, preauricular or posterior auricular adenopathy.     Cervical: No cervical adenopathy.     Right cervical: No superficial or deep cervical adenopathy.    Left cervical: No superficial or deep cervical adenopathy.  Skin:    General: Skin is warm and dry.     Coloration: Skin is not pale.     Findings: No abrasion, bruising, ecchymosis, erythema, lesion or rash.     Nails: There is no clubbing.  Neurological:     General: No focal deficit present.     Mental Status: She is alert and oriented to person, place, and time.     Sensory: No sensory deficit.     Coordination: Coordination normal.     Gait: Gait normal.  Psychiatric:        Attention and Perception: She is attentive.        Mood and Affect: Mood normal.        Speech: Speech normal.        Behavior: Behavior normal. Behavior is cooperative.        Thought Content: Thought content normal.        Judgment: Judgment normal.    Right hand March 06, 2021:        Right hand March 25, 2021:       PICC line November 2022 visit     03/25/2021: PICC line          Assessment & Plan:   Hand abscess with septic arthritis of the wrist status post several I&D's:   I reviewed her labs from home health which have shown her sed rate that is normalized now on [facility]'s reference range going down to 40 from a value of  58 this was on labs obtained on March 18, 2021.  CRP is also normal and labs reviewed from that blood draw. She is completed her course of ceftriaxone paired with Zyvox and then doxycycline.  We will have her have the PICC line removed today and monitor her off antibiotics.  Middle finger swelling: This bothers me given that is the same  hand where she had her hand abscess.  She says the swelling does go up and go down again.  She is scheduled to see her hand surgeon in 15 days.  I have also scheduled to see me on December 16 at 8:45 in the morning.  Drug-induced thrombocytopenia:  I noted on her labs from March 18, 2021 drawn with home health that her platelets were now 118.  I suspect this is due to the Zyvox.  We will recheck a CBC with differential today.  High output cardiac failure: Follow-up with PCP and cardiology  Follow-up with PCP and cardiology.   diabetes mellitus follow-up with PCP.

## 2021-04-05 ENCOUNTER — Other Ambulatory Visit: Payer: Self-pay

## 2021-04-05 ENCOUNTER — Encounter: Payer: Self-pay | Admitting: Infectious Disease

## 2021-04-05 ENCOUNTER — Ambulatory Visit (INDEPENDENT_AMBULATORY_CARE_PROVIDER_SITE_OTHER): Payer: BC Managed Care – PPO | Admitting: Infectious Disease

## 2021-04-05 ENCOUNTER — Ambulatory Visit (INDEPENDENT_AMBULATORY_CARE_PROVIDER_SITE_OTHER): Payer: BC Managed Care – PPO

## 2021-04-05 VITALS — BP 176/82 | HR 108 | Temp 98.4°F | Wt 111.0 lb

## 2021-04-05 DIAGNOSIS — E119 Type 2 diabetes mellitus without complications: Secondary | ICD-10-CM

## 2021-04-05 DIAGNOSIS — N2889 Other specified disorders of kidney and ureter: Secondary | ICD-10-CM

## 2021-04-05 DIAGNOSIS — Z23 Encounter for immunization: Secondary | ICD-10-CM | POA: Diagnosis not present

## 2021-04-05 DIAGNOSIS — D6959 Other secondary thrombocytopenia: Secondary | ICD-10-CM | POA: Diagnosis not present

## 2021-04-05 DIAGNOSIS — M009 Pyogenic arthritis, unspecified: Secondary | ICD-10-CM | POA: Diagnosis not present

## 2021-04-05 DIAGNOSIS — L02519 Cutaneous abscess of unspecified hand: Secondary | ICD-10-CM | POA: Diagnosis not present

## 2021-04-05 DIAGNOSIS — T50905A Adverse effect of unspecified drugs, medicaments and biological substances, initial encounter: Secondary | ICD-10-CM

## 2021-04-05 DIAGNOSIS — Z7185 Encounter for immunization safety counseling: Secondary | ICD-10-CM

## 2021-04-05 DIAGNOSIS — I151 Hypertension secondary to other renal disorders: Secondary | ICD-10-CM

## 2021-04-05 NOTE — Progress Notes (Signed)
Subjective:  Chief complaint :  Patient ID: Christy Higgins, female    DOB: [birth year], 70 y.o.   MRN: [id number]  HPI  Christy Higgins is a 70 year old black woman who was recently hospitalized with right hand abscess and septic arthritis status post I&D on October 19 21st and 24th.  Cultures were negative but likely compromised by her having been on oral Bactrim prior to surgery.  She then was discharged on daptomycin and ceftriaxone with plans for 6 weeks of antibiotics.  She was readmitted with fevers shortness of breath and cough.  Due to the possibility that that she could be having a drug fever and eosinophilic pneumonia from daptomycin this drug was stopped and it was exchanged for vancomycin.  However it was felt more likely that her shortness of breath and cough were due to have high-output heart failure.  While in the hospital she had repeat MRI that showed of the wrist showed a tiny fluid collection.  Orthopedic surgery did not feel there is any need for further intervention and her sutures were removed.  She was on ceftriaxone and vancomycin but had acute Kindrick kidney injury and was switched over to ceftriaxone and linezolid.  Plans were to continue these 2 antibiotics through 21 November and then exchange linezolid for doxycycline.  She says her hand pain improved dramatically versus when she was in the hospital when last saw her.  At that visit she appeared to beboth doxycycline and the Zyvox I have asked her stop the doxycycline for now until she gets to the 21st and then started again.  She did do this.  I have reviewed labs from home health and it did show that she developed some thrombocytopenia presumably from the Zyvox.  She has seen her hand surgeon (Dr Caralyn Guile) who was  happy with her progress.  Last visit with me on December 5 was her last day of antibiotics.  Had noted some thrombocytopenia on home health labs repeated CBC with differential which showed normalization of her  platelets.    That visit she had some swelling of her middle finger but this is now improved dramatically since I last saw her.  She has again seen Dr. Apolonio Schneiders who she says is pleased with things on the whole although he had some concerns about some of her bones in her wrist and had scheduled a follow-up on January 13.       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   Finger joint swelling, right 03/25/2021   High cholesterol    Hypertension     Past Surgical History:  Procedure Laterality Date   BIOPSY  02/27/2021   Procedure: BIOPSY;  Surgeon: Lavena Bullion, DO;  Location: [place];  Service: Gastroenterology;;   CESAREAN SECTION     COLONOSCOPY Left 02/27/2021   Procedure: COLONOSCOPY;  Surgeon: Lavena Bullion, DO;  Location: [place] [facility];  Service: Gastroenterology;  Laterality: Left;   ESOPHAGOGASTRODUODENOSCOPY Left 02/27/2021   Procedure: ESOPHAGOGASTRODUODENOSCOPY (EGD);  Surgeon: Lavena Bullion, DO;  Location: [facility];  Service: Gastroenterology;  Laterality: Left;   I & D EXTREMITY Right 02/06/2021   Procedure: IRRIGATION AND DEBRIDEMENT RIGHT HAND;  Surgeon: Iran Planas, MD;  Location: [place];  Service: Orthopedics;  Laterality: Right;   I & D EXTREMITY Right 02/08/2021   Procedure: IRRIGATION AND DEBRIDEMENT EXTREMITY;  Surgeon: Orene Desanctis, MD;  Location: [place];  Service: Orthopedics;  Laterality: Right;   I & D EXTREMITY Right 02/11/2021   Procedure: IRRIGATION AND DEBRIDEMENT RIGHT WRIST;  Surgeon: Iran Planas, MD;  Location: [place];  Service: Orthopedics;  Laterality: Right;   TONSILLECTOMY      Family History  Problem Relation Age of Onset   Hypertension Mother    Diabetes Father    Hypertension Father    Hypertension Sister    Diabetes Sister    Hypertension Sister    Diabetes Sister    Hypertension Sister    Diabetes Sister        Social History   Socioeconomic History   Marital status: Married    Spouse name: Not on file   Number of children: 1   Years of education: Not on file   Highest education level: Not on file  Occupational History   Not on file  Tobacco Use   Smoking status: Never   Smokeless tobacco: Never  Substance and Sexual Activity   Alcohol use: Yes    Comment: beer occasionally   Drug use: No   Sexual activity: Not on file  Other Topics Concern   Not on file  Social History Narrative   Not on file   Social Determinants of Health   Financial Resource Strain: Not on file  Food Insecurity: Not on file  Transportation Needs: Not on file  Physical Activity: Not on file  Stress: Not on file  Social Connections: Not on file    No Known Allergies   Current Outpatient Medications:    acetaminophen-codeine (TYLENOL #3) 300-30 MG tablet, Take 1 tablet by mouth every 4 (four) hours as needed for moderate pain., Disp: , Rfl:    HYDROcodone-acetaminophen (NORCO/VICODIN) 5-325 MG tablet, Take 1 tablet by mouth every 6 (six) hours as needed for moderate pain., Disp: , Rfl:    losartan (COZAAR) 100 MG tablet, Take 100 mg by mouth daily., Disp: , Rfl:    meloxicam (MOBIC) 7.5 MG tablet, Take 7.5 mg by mouth daily., Disp: , Rfl:    metFORMIN (GLUCOPHAGE-XR) 750 MG 24 hr tablet, Take 750 mg by mouth every evening., Disp: , Rfl:    pantoprazole (PROTONIX) 40 MG tablet, Take 1 tablet (40 mg total) by mouth 2 (two) times daily., Disp: 84 tablet, Rfl: 0   traMADol (ULTRAM) 50 MG tablet, Take 50 mg by mouth every 6 (six) hours as needed for moderate pain., Disp: , Rfl:    atorvastatin (LIPITOR) 40 MG tablet, Take 1 tablet (40 mg total) by mouth daily., Disp: 30 tablet, Rfl: 0    Review of Systems  Constitutional:  Negative for activity change, appetite change, chills, diaphoresis, fatigue, fever and unexpected weight change.  HENT:  Negative for congestion, rhinorrhea, sinus pressure, sneezing, sore  throat and trouble swallowing.   Eyes:  Negative for photophobia and visual disturbance.  Respiratory:  Negative for cough, chest tightness, shortness of breath, wheezing and stridor.   Cardiovascular:  Negative for chest pain, palpitations and leg swelling.  Gastrointestinal:  Negative for abdominal distention, abdominal pain, anal bleeding, blood in stool, constipation, diarrhea, nausea and vomiting.  Genitourinary:  Negative for difficulty urinating, dysuria, flank pain and hematuria.  Musculoskeletal:  Negative for arthralgias, back pain, gait problem, joint swelling and myalgias.  Skin:  Negative for color change, pallor, rash and wound.  Neurological:  Negative for dizziness, tremors, weakness and light-headedness.  Hematological:  Negative for adenopathy. Does not bruise/bleed easily.  Psychiatric/Behavioral:  Negative for agitation, behavioral problems,  confusion, decreased concentration, dysphoric mood and sleep disturbance.       Objective:   Physical Exam Constitutional:      General: She is not in acute distress.    Appearance: Normal appearance. She is well-developed. She is not ill-appearing or diaphoretic.  HENT:     Head: Normocephalic and atraumatic.     Right Ear: Hearing and external ear normal.     Left Ear: Hearing and external ear normal.     Nose: No nasal deformity or rhinorrhea.  Eyes:     General: No scleral icterus.    Conjunctiva/sclera: Conjunctivae normal.     Right eye: Right conjunctiva is not injected.     Left eye: Left conjunctiva is not injected.     Pupils: Pupils are equal, round, and reactive to light.  Neck:     Vascular: No JVD.  Cardiovascular:     Rate and Rhythm: Normal rate and regular rhythm.     Heart sounds: Normal heart sounds, S1 normal and S2 normal. No murmur heard.   No friction rub.  Abdominal:     General: Bowel sounds are normal. There is no distension.     Palpations: Abdomen is soft.     Tenderness: There is no abdominal  tenderness.  Musculoskeletal:     Right shoulder: Normal.     Left shoulder: Normal.     Cervical back: Normal range of motion and neck supple.     Right hip: Normal.     Left hip: Normal.     Right knee: Normal.     Left knee: Normal.  Lymphadenopathy:     Head:     Right side of head: No submandibular, preauricular or posterior auricular adenopathy.     Left side of head: No submandibular, preauricular or posterior auricular adenopathy.     Cervical: No cervical adenopathy.     Right cervical: No superficial or deep cervical adenopathy.    Left cervical: No superficial or deep cervical adenopathy.  Skin:    General: Skin is warm and dry.     Coloration: Skin is not pale.     Findings: No abrasion, bruising, ecchymosis, erythema, lesion or rash.     Nails: There is no clubbing.  Neurological:     General: No focal deficit present.     Mental Status: She is alert and oriented to person, place, and time.     Sensory: No sensory deficit.     Coordination: Coordination normal.     Gait: Gait normal.  Psychiatric:        Attention and Perception: She is attentive.        Mood and Affect: Mood normal.        Speech: Speech normal.        Behavior: Behavior normal. Behavior is cooperative.        Thought Content: Thought content normal.        Judgment: Judgment normal.    Right hand March 06, 2021:        Right hand March 25, 2021:       04/05/2021:               Assessment & Plan:   Abscess with septic arthritis of the wrist status multiple I&D's:  She is off antibiotics now we will repeat sed rate CRP BMP and CBC differential  She can return to clinic as needed unless these inflammatory markers are discouraging  Middle finger swelling: This is improving  Drug-induced thrombocytopenia: Resolved  Beatties mellitus following with PCP diabetes  Seen counseling I recommended she get the updated COVID-19 booster today which she is  receiving

## 2021-04-05 NOTE — Progress Notes (Signed)
° °  Covid-19 Vaccination Clinic  Name:  Christy Higgins    MRN: [id number] DOB: 1950-06-13  04/05/2021  [name] was observed post Covid-19 immunization for 15 minutes without incident. She was provided with Vaccine Information Sheet and instruction to access the V-Safe system.   [name] was instructed to call 911 with any severe reactions post vaccine: Difficulty breathing  Swelling of face and throat  A fast heartbeat  A bad rash all over body  Dizziness and weakness     Clayborne Artist CMA

## 2021-04-06 LAB — CBC WITH DIFFERENTIAL/PLATELET
Absolute Monocytes: 699 cells/uL (ref 200–950)
Basophils Absolute: 23 cells/uL (ref 0–200)
Basophils Relative: 0.3 %
Eosinophils Absolute: 198 cells/uL (ref 15–500)
Eosinophils Relative: 2.6 %
HCT: 26.2 % — ABNORMAL LOW (ref 35.0–45.0)
Hemoglobin: 8.4 g/dL — ABNORMAL LOW (ref 11.7–15.5)
Lymphs Abs: 1999 cells/uL (ref 850–3900)
MCH: 27.1 pg (ref 27.0–33.0)
MCHC: 32.1 g/dL (ref 32.0–36.0)
MCV: 84.5 fL (ref 80.0–100.0)
MPV: 11.5 fL (ref 7.5–12.5)
Monocytes Relative: 9.2 %
Neutro Abs: 4682 cells/uL (ref 1500–7800)
Neutrophils Relative %: 61.6 %
Platelets: 348 10*3/uL (ref 140–400)
RBC: 3.1 10*6/uL — ABNORMAL LOW (ref 3.80–5.10)
RDW: 15.7 % — ABNORMAL HIGH (ref 11.0–15.0)
Total Lymphocyte: 26.3 %
WBC: 7.6 10*3/uL (ref 3.8–10.8)

## 2021-04-06 LAB — BASIC METABOLIC PANEL WITH GFR
BUN/Creatinine Ratio: 10 (calc) (ref 6–22)
BUN: 5 mg/dL — ABNORMAL LOW (ref 7–25)
CO2: 28 mmol/L (ref 20–32)
Calcium: 8.7 mg/dL (ref 8.6–10.4)
Chloride: 107 mmol/L (ref 98–110)
Creat: 0.52 mg/dL (ref 0.50–1.05)
Glucose, Bld: 86 mg/dL (ref 65–99)
Potassium: 3.7 mmol/L (ref 3.5–5.3)
Sodium: 145 mmol/L (ref 135–146)
eGFR: 101 mL/min/{1.73_m2} (ref >=60)

## 2021-04-06 LAB — SEDIMENTATION RATE: Sed Rate: 33 mm/h — ABNORMAL HIGH (ref 0–30)

## 2021-04-06 LAB — C-REACTIVE PROTEIN: CRP: 39.6 mg/L — ABNORMAL HIGH (ref <8.0)

## 2021-04-08 ENCOUNTER — Telehealth: Payer: Self-pay

## 2021-04-08 NOTE — Telephone Encounter (Signed)
-----   Message from Randall Hiss, MD sent at 04/08/2021  3:43 PM EST ----- Telemetry markers are up again at least her CRP is increased significantly.  I would like her to if she could schedule with me in February or if with another one of my partners in January ----- Message ----- From: Janace Hoard Lab Results In Sent: 04/05/2021   4:51 PM EST To: Randall Hiss, MD

## 2021-04-08 NOTE — Telephone Encounter (Signed)
Attempted to call patient to go over results with her and schedule appointment.  Voicemail box not set up to leave a message.    Dragon Thrush Lesli Albee, CMA

## 2021-04-09 NOTE — Telephone Encounter (Signed)
Attempted to contact patient today. Unable to leave message. Patient's voicemail not setup

## 2021-04-10 NOTE — Telephone Encounter (Signed)
Attempted to reach patient again. Staff unable to leave voice message.  Valarie Cones

## 2021-04-11 NOTE — Telephone Encounter (Signed)
Was able to schedule in patient in late February as she declined seeing another provider. Would you like for patient to restart PO antibiotics or hold off at this time?

## 2021-06-17 NOTE — Progress Notes (Signed)
Subjective:  Chief complaint : Follow-up for hand infection  Patient ID: Christy Higgins, female    DOB: [birth year], 71 y.o.   MRN: [id number]  HPI  Christy Higgins is a 71 year old black woman who was recently hospitalized with right hand abscess and septic arthritis status post I&D on October 19 21st and 24th.  Cultures were negative but likely compromised by her having been on oral Bactrim prior to surgery.  She then was discharged on daptomycin and ceftriaxone with plans for 6 weeks of antibiotics.  She was readmitted with fevers shortness of breath and cough.  Due to the possibility that that she could be having a drug fever and eosinophilic pneumonia from daptomycin this drug was stopped and it was exchanged for vancomycin.  However it was felt more likely that her shortness of breath and cough were due to have high-output heart failure.  While in the hospital she had repeat MRI that showed of the wrist showed a tiny fluid collection.  Orthopedic surgery did not feel there is any need for further intervention and her sutures were removed.  She was on ceftriaxone and vancomycin but had acute Kindrick kidney injury and was switched over to ceftriaxone and linezolid.  Plans were to continue these 2 antibiotics through 21 November and then exchange linezolid for doxycycline.  She said that her hand pain improved dramatically    At that visit she appeared to beboth doxycycline and the Zyvox Ihad  her stop the doxycycline for now until she gets to the 21st and then started again.  She did do this.  I have reviewed labs from home health and it did show that she developed some thrombocytopenia presumably from the Zyvox.  She has seen her hand surgeon (Dr Caralyn Guile) who was  happy with her progress.  Last visit with me on December 5 was her last day of antibiotics.  Had noted some thrombocytopenia on home health labs repeated CBC with differential which showed normalization of her platelets.    That visit  she had some swelling of her middle finger but this then improved dramatically before her last visit withme.   She has again seen Dr. Apolonio Schneiders   Apparently swelling in her middle finger has completely resolved now.  When I saw her last we checked sed rate and CRP and both in particular the CRP was markedly elevated so I had her come back to see me again she is continue to be off antibiotics and only has some pain on the lateral aspect of her wrist at times.  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   Finger joint swelling, right 03/25/2021   High cholesterol    Hypertension     Past Surgical History:  Procedure Laterality Date   BIOPSY  02/27/2021   Procedure: BIOPSY;  Surgeon: Lavena Bullion, DO;  Location: [place] [facility];  Service: Gastroenterology;;   CESAREAN SECTION     COLONOSCOPY Left 02/27/2021   Procedure: COLONOSCOPY;  Surgeon: Lavena Bullion, DO;  Location: [place] [facility];  Service: Gastroenterology;  Laterality: Left;   ESOPHAGOGASTRODUODENOSCOPY Left 02/27/2021   Procedure: ESOPHAGOGASTRODUODENOSCOPY (EGD);  Surgeon: Lavena Bullion, DO;  Location:  [place] [facility];  Service: Gastroenterology;  Laterality: Left;   I & D EXTREMITY Right 02/06/2021   Procedure: IRRIGATION AND DEBRIDEMENT RIGHT HAND;  Surgeon: Iran Planas, MD;  Location: [place];  Service: Orthopedics;  Laterality: Right;   I & D EXTREMITY Right 02/08/2021   Procedure: IRRIGATION AND DEBRIDEMENT EXTREMITY;  Surgeon: Orene Desanctis, MD;  Location: [place];  Service: Orthopedics;  Laterality: Right;   I & D EXTREMITY Right 02/11/2021   Procedure: IRRIGATION AND DEBRIDEMENT RIGHT WRIST;  Surgeon: Iran Planas, MD;  Location:  [place];  Service: Orthopedics;  Laterality: Right;   TONSILLECTOMY      Family History  Problem Relation Age of Onset   Hypertension Mother    Diabetes Father    Hypertension Father    Hypertension Sister    Diabetes Sister    Hypertension Sister    Diabetes Sister    Hypertension Sister    Diabetes Sister       Social History   Socioeconomic History   Marital status: Married    Spouse name: Not on file   Number of children: 1   Years of education: Not on file   Highest education level: Not on file  Occupational History   Not on file  Tobacco Use   Smoking status: Never   Smokeless tobacco: Never  Substance and Sexual Activity   Alcohol use: Yes    Comment: beer occasionally   Drug use: No   Sexual activity: Not on file  Other Topics Concern   Not on file  Social History Narrative   Not on file   Social Determinants of Health   Financial Resource Strain: Not on file  Food Insecurity: Not on file  Transportation Needs: Not on file  Physical Activity: Not on file  Stress: Not on file  Social Connections: Not on file    No Known Allergies   Current Outpatient Medications:    acetaminophen-codeine (TYLENOL #3) 300-30 MG tablet, Take 1 tablet by mouth every 4 (four) hours as needed for moderate pain., Disp: , Rfl:    atorvastatin (LIPITOR) 40 MG tablet, Take 1 tablet (40 mg total) by mouth daily., Disp: 30 tablet, Rfl: 0   HYDROcodone-acetaminophen (NORCO/VICODIN) 5-325 MG tablet, Take 1 tablet by mouth every 6 (six) hours as needed for moderate pain., Disp: , Rfl:    losartan (COZAAR) 100 MG tablet, Take 100 mg by mouth daily., Disp: , Rfl:    meloxicam (MOBIC) 7.5 MG tablet, Take 7.5 mg by mouth daily., Disp: , Rfl:    metFORMIN (GLUCOPHAGE-XR) 750 MG 24 hr tablet, Take 750 mg by mouth every evening., Disp: , Rfl:    pantoprazole (PROTONIX) 40 MG tablet, Take 1 tablet (40 mg total) by mouth 2 (two) times daily., Disp: 84 tablet, Rfl: 0   traMADol (ULTRAM) 50  MG tablet, Take 50 mg by mouth every 6 (six) hours as needed for moderate pain., Disp: , Rfl:     Review of Systems  Constitutional:  Negative for activity change, appetite change, chills, diaphoresis, fatigue, fever and unexpected weight change.  HENT:  Negative for congestion, rhinorrhea, sinus pressure, sneezing, sore throat and trouble swallowing.   Eyes:  Negative for photophobia and visual disturbance.  Respiratory:  Negative for cough, chest tightness, shortness of breath, wheezing and stridor.   Cardiovascular:  Negative for chest pain, palpitations and leg swelling.  Gastrointestinal:  Negative for abdominal distention, abdominal pain, anal bleeding, blood in stool, constipation, diarrhea, nausea and vomiting.  Genitourinary:  Negative for difficulty urinating, dysuria, flank pain and hematuria.  Musculoskeletal:  Positive for arthralgias. Negative for back pain, gait problem, joint swelling and myalgias.  Skin:  Negative for color change, pallor, rash and wound.  Neurological:  Negative for dizziness, tremors, weakness, light-headedness and headaches.  Hematological:  Negative for adenopathy. Does not bruise/bleed easily.  Psychiatric/Behavioral:  Negative for agitation, behavioral problems, confusion, decreased concentration, dysphoric mood, sleep disturbance and suicidal ideas.       Objective:   Physical Exam Constitutional:      General: She is not in acute distress.    Appearance: Normal appearance. She is well-developed. She is not ill-appearing or diaphoretic.  HENT:     Head: Normocephalic and atraumatic.     Right Ear: Hearing and external ear normal.     Left Ear: Hearing and external ear normal.     Nose: No nasal deformity or rhinorrhea.  Eyes:     General: No scleral icterus.    Conjunctiva/sclera: Conjunctivae normal.     Right eye: Right conjunctiva is not injected.     Left eye: Left conjunctiva is not injected.     Pupils: Pupils are equal, round, and  reactive to light.  Neck:     Vascular: No JVD.  Cardiovascular:     Rate and Rhythm: Normal rate and regular rhythm.     Heart sounds: S1 normal and S2 normal.  Pulmonary:     Effort: Pulmonary effort is normal. No respiratory distress.     Breath sounds: No wheezing.  Abdominal:     General: Bowel sounds are normal. There is no distension.     Palpations: Abdomen is soft.     Tenderness: There is no abdominal tenderness.  Musculoskeletal:        General: Normal range of motion.     Right shoulder: Normal.     Left shoulder: Normal.     Cervical back: Normal range of motion and neck supple.     Right hip: Normal.     Left hip: Normal.     Right knee: Normal.     Left knee: Normal.  Lymphadenopathy:     Head:     Right side of head: No submandibular, preauricular or posterior auricular adenopathy.     Left side of head: No submandibular, preauricular or posterior auricular adenopathy.     Cervical: No cervical adenopathy.     Right cervical: No superficial or deep cervical adenopathy.    Left cervical: No superficial or deep cervical adenopathy.  Skin:    General: Skin is warm and dry.     Coloration: Skin is not pale.     Findings: No abrasion, bruising, ecchymosis, erythema, lesion or rash.     Nails: There is no clubbing.  Neurological:     General: No focal deficit present.     Mental Status: She is alert and oriented to person, place, and time.     Sensory: No sensory deficit.     Coordination: Coordination normal.     Gait: Gait normal.  Psychiatric:        Attention and Perception: She is attentive.        Mood and Affect: Mood normal.        Speech: Speech normal.        Behavior: Behavior normal. Behavior is cooperative.        Thought Content: Thought content normal.        Judgment: Judgment normal.  Right hand March 06, 2021:        Right hand March 25, 2021:       04/05/2021:          06/18/2021:          Assessment  & Plan:  Abscess with septic arthritis of wrist status post multiple I&D's:  We have been observing her off antibiotics and she says no clinical evidence of recurrence I will recheck sed rate CRP BMP and CBC with differential.  Even if sed rate and CRP are still up I will have her follow-up as needed if there is worsening pain.  She is unaware of other reasons for her to have elevated inflammatory markers  Drug-induced thrombocytopenia: This has resolved   DM: She remains on metformin.  Hypertension remains on Cozaar prescription.  Vaccine counseling recommended Prevnar 20.

## 2021-06-18 ENCOUNTER — Other Ambulatory Visit: Payer: Self-pay

## 2021-06-18 ENCOUNTER — Ambulatory Visit: Payer: BC Managed Care – PPO | Admitting: Infectious Disease

## 2021-06-18 ENCOUNTER — Telehealth: Payer: Self-pay

## 2021-06-18 ENCOUNTER — Encounter: Payer: Self-pay | Admitting: Infectious Disease

## 2021-06-18 VITALS — BP 138/83 | HR 101 | Temp 98.4°F | Wt 108.0 lb

## 2021-06-18 DIAGNOSIS — Z23 Encounter for immunization: Secondary | ICD-10-CM

## 2021-06-18 DIAGNOSIS — L02519 Cutaneous abscess of unspecified hand: Secondary | ICD-10-CM | POA: Diagnosis not present

## 2021-06-18 DIAGNOSIS — D6959 Other secondary thrombocytopenia: Secondary | ICD-10-CM

## 2021-06-18 DIAGNOSIS — M009 Pyogenic arthritis, unspecified: Secondary | ICD-10-CM | POA: Diagnosis not present

## 2021-06-18 DIAGNOSIS — M19031 Primary osteoarthritis, right wrist: Secondary | ICD-10-CM | POA: Diagnosis not present

## 2021-06-18 DIAGNOSIS — T50905A Adverse effect of unspecified drugs, medicaments and biological substances, initial encounter: Secondary | ICD-10-CM

## 2021-06-18 NOTE — Telephone Encounter (Signed)
Called patient after visit today to inform her MD was able to write letter for work. Patient states she will come by office tomorrow in the morning to pick up copy. Will leave letter at the front desk for her to pick up. Juanita Laster, RMA

## 2021-06-19 ENCOUNTER — Telehealth: Payer: Self-pay

## 2021-06-19 LAB — BASIC METABOLIC PANEL WITH GFR
BUN: 14 mg/dL (ref 7–25)
CO2: 27 mmol/L (ref 20–32)
Calcium: 9.7 mg/dL (ref 8.6–10.4)
Chloride: 107 mmol/L (ref 98–110)
Creat: 0.75 mg/dL (ref 0.60–1.00)
Glucose, Bld: 101 mg/dL — ABNORMAL HIGH (ref 65–99)
Potassium: 4 mmol/L (ref 3.5–5.3)
Sodium: 141 mmol/L (ref 135–146)
eGFR: 86 mL/min/{1.73_m2} (ref >=60)

## 2021-06-19 LAB — CBC WITH DIFFERENTIAL/PLATELET
Absolute Monocytes: 623 cells/uL (ref 200–950)
Basophils Absolute: 21 cells/uL (ref 0–200)
Basophils Relative: 0.3 %
Eosinophils Absolute: 140 cells/uL (ref 15–500)
Eosinophils Relative: 2 %
HCT: 37.4 % (ref 35.0–45.0)
Hemoglobin: 11.7 g/dL (ref 11.7–15.5)
Lymphs Abs: 2751 cells/uL (ref 850–3900)
MCH: 27.3 pg (ref 27.0–33.0)
MCHC: 31.3 g/dL — ABNORMAL LOW (ref 32.0–36.0)
MCV: 87.2 fL (ref 80.0–100.0)
MPV: 11.2 fL (ref 7.5–12.5)
Monocytes Relative: 8.9 %
Neutro Abs: 3465 cells/uL (ref 1500–7800)
Neutrophils Relative %: 49.5 %
Platelets: 216 10*3/uL (ref 140–400)
RBC: 4.29 10*6/uL (ref 3.80–5.10)
RDW: 15.5 % — ABNORMAL HIGH (ref 11.0–15.0)
Total Lymphocyte: 39.3 %
WBC: 7 10*3/uL (ref 3.8–10.8)

## 2021-06-19 LAB — C-REACTIVE PROTEIN: CRP: 3 mg/L (ref <8.0)

## 2021-06-19 LAB — SEDIMENTATION RATE: Sed Rate: 11 mm/h (ref 0–30)

## 2021-06-19 NOTE — Telephone Encounter (Signed)
Called patient to relay results, call could not be completed.   Lynda Capistran D Brenleigh Collet, RN  

## 2021-06-19 NOTE — Telephone Encounter (Signed)
-----   Message from Randall Hiss, MD sent at 06/19/2021  1:49 PM EST ----- ?Labs look great no need to RTC unless she has new problems ?----- Message ----- ?From: Interface, [facility] Lab Results In ?Sent: 06/18/2021  10:57 PM EST ?To: Randall Hiss, MD ? ? ?

## 2021-06-20 NOTE — Telephone Encounter (Signed)
Second attempt to reach patient, call could not be completed.  Elyjah Hazan D Tennelle Taflinger, RN   

## 2021-06-21 NOTE — Telephone Encounter (Signed)
Attempted for third time to reach patient unable to leave vm.  ?

## 2021-06-28 ENCOUNTER — Other Ambulatory Visit: Payer: Self-pay | Admitting: Nurse Practitioner

## 2021-06-28 DIAGNOSIS — Z1231 Encounter for screening mammogram for malignant neoplasm of breast: Secondary | ICD-10-CM

## 2022-01-27 IMAGING — MR MR WRIST*R* WO/W CM
9 of 11 series · 37 of 40 positions shown · IV contrast (gadavist)
Comparison: Radiographs 02/20/2021 and prior MRI of the hand from
01/27/2021

CLINICAL DATA: Wrist pain, fever, suspected infection

EXAM:
MR OF THE RIGHT WRIST WITHOUT AND WITH CONTRAST
TECHNIQUE: Multiplanar multisequence MR imaging of the right wrist was
performed both before and after the administration of intravenous
contrast.
CONTRAST:  5mL GADAVIST GADOBUTROL 1 MMOL/ML IV SOLN

[Series 3: T2 fat-sat · axial · 2.0mm · 0.48mm/px · z∈[-41,+9]mm · 4 of 28 slices shown (1 of 2)]
[im 1/28]
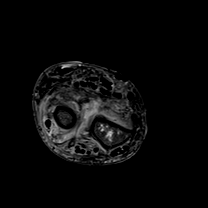
[im 10/28]
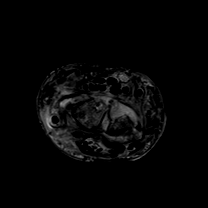
[im 19/28]
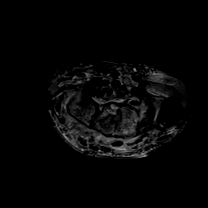
[im 28/28]
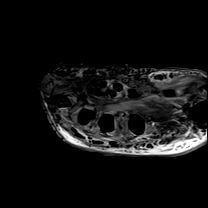

[Series 4: T1 · axial · 2.0mm · 0.33mm/px · z∈[-44,+6]mm · 4 of 28 slices shown (1 of 2)]
[im 1/28]
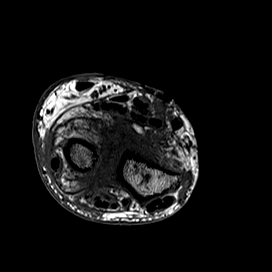
[im 10/28]
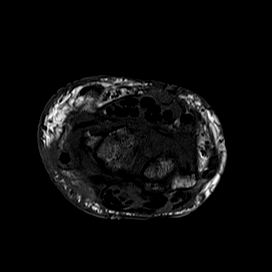
[im 19/28]
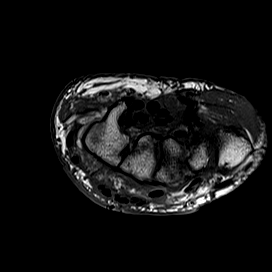
[im 28/28]
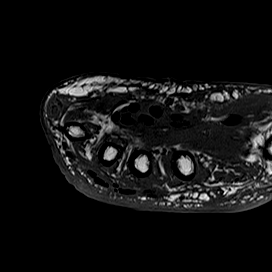

[Series 5: T1 · coronal · 2.0mm · 0.31mm/px · 4 of 23 slices shown (2 of 2)]
[im 1/23]
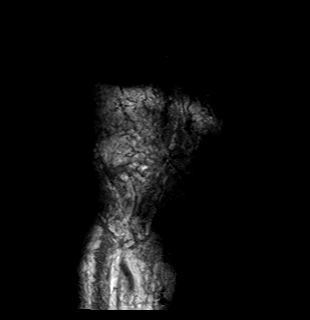
[im 8/23]
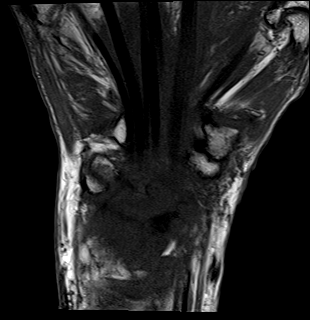
[im 15/23]
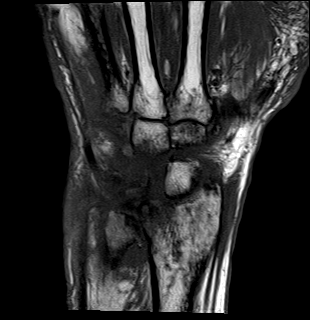
[im 23/23]
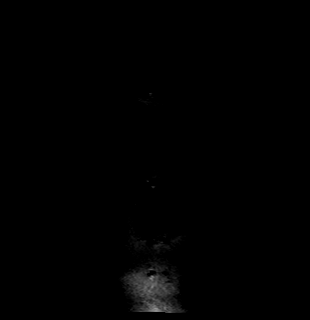

[Series 6: T2 fat-sat · coronal · 2.0mm · 0.48mm/px · 4 of 23 slices shown (2 of 2)]
[im 1/23]
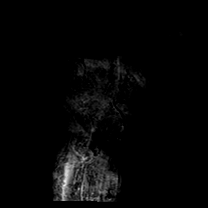
[im 8/23]
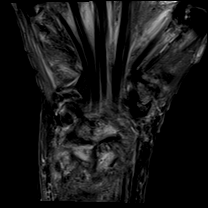
[im 15/23]
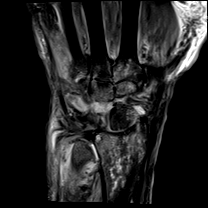
[im 23/23]
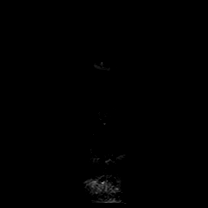

[Series 7: PD fat-sat · coronal · 2.0mm · 0.33mm/px · 4 of 23 slices shown (1 of 2)]
[im 1/23]
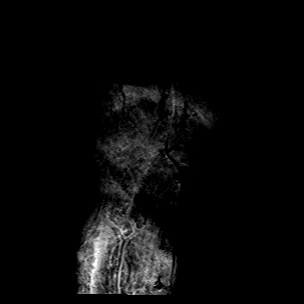
[im 8/23]
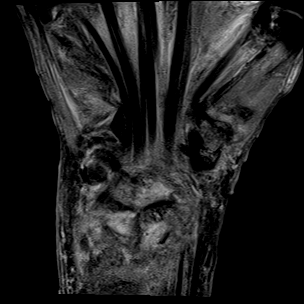
[im 15/23]
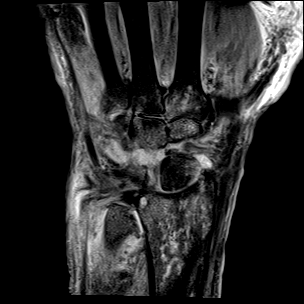
[im 23/23]
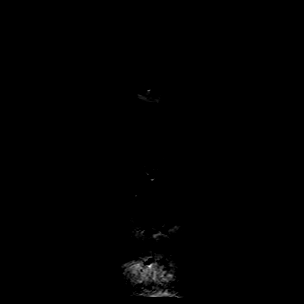

[Series 8: PD fat-sat · oblique · 2.0mm · 0.33mm/px · 5 of 33 slices shown (2 of 2)]
[im 1/33]
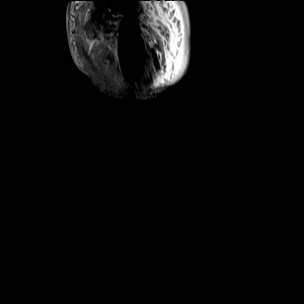
[im 9/33]
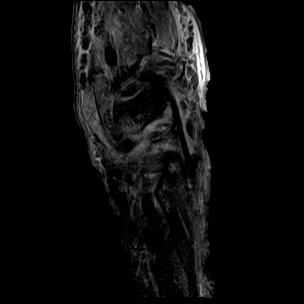
[im 17/33]
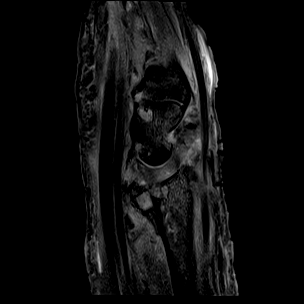
[im 25/33]
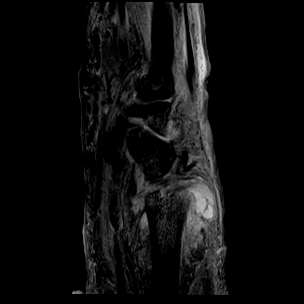
[im 33/33]
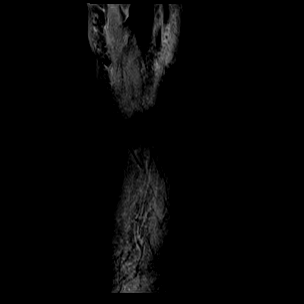

[Series 9: T1 fat-sat · axial · non-contrast · 2.0mm · 0.33mm/px · z∈[-44,+6]mm · 4 of 28 slices shown]
[im 1/28]
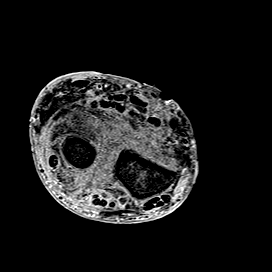
[im 10/28]
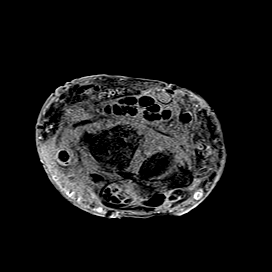
[im 19/28]
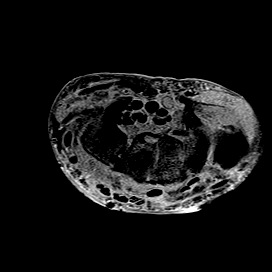
[im 28/28]
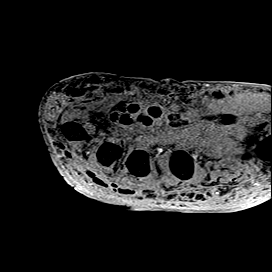

[Series 10: T1 fat-sat post-contrast · axial · 2.0mm · 0.33mm/px · z∈[-44,+6]mm · 4 of 28 slices shown (1 of 2)]
[im 1/28]
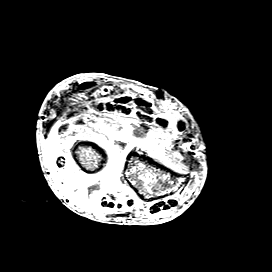
[im 10/28]
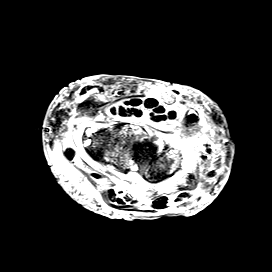
[im 19/28]
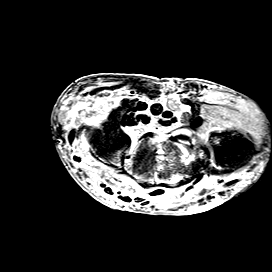
[im 28/28]
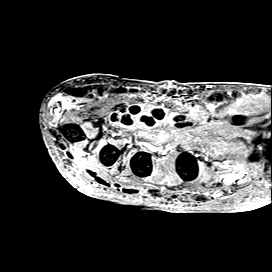

[Series 11: T1 fat-sat post-contrast · coronal · 2.0mm · 0.37mm/px · 4 of 23 slices shown (2 of 2)]
[im 1/23]
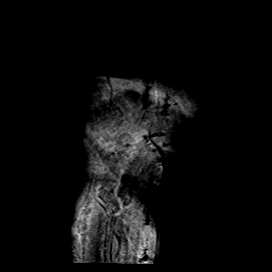
[im 8/23]
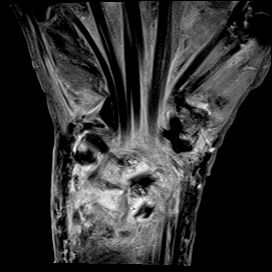
[im 15/23]
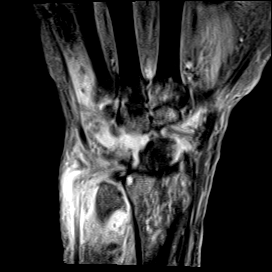
[im 23/23]
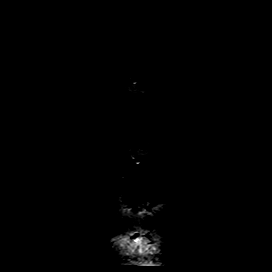

[37 of 40 positions shown; findings below may reference images not displayed]

FINDINGS: Ligaments: Torn scapholunate ligament with SLAC wrist.

Triangular fibrocartilage: Large tear suspected for example image 12
series 6 in the TFCC disc.

Tendons: Accentuated synovial enhancement in the second and fourth
extensor compartments.

Carpal tunnel/median nerve: There is some mildly accentuated edema
in the median nerve for example on image 19 series 3. Low-grade
enhancement surrounding the carpal tunnel structures without
substantial fluid signal.

Guyon's canal: Unremarkable

Joint/cartilage: Severe arthropathy in the radiocarpal joint with
prominent subcortical cystic lesions and/or erosions, eroded radial
margin of the lunate, SLAC wrist, a prominent chondral thinning.
Extensive synovitis in the radiocarpal joint and midcarpal row.

Bones/carpal alignment: Large confluent subcortical cysts are
present in the distal radius with smaller cystic lesions in the
distal ulna. Synovitis in the distal radioulnar joint with
associated spurring. SLAC wrist noted. Scattered marrow edema
throughout the distal radius, distal ulna, carpus, and proximal
metacarpals.

Other: The large fluid collection previously seen between the
pronator and extending up along the flexor tendons at the level of
the distal radius and ulna is no longer well appreciated. Similarly,
the fluid collection lateral to the ulna is no longer appreciated.
The volar lateral fluid collection along the wrist shown on the
prior exam is likewise currently absent.

Along the posterior margin of the distal radioulnar joint and
posterior to the although we demonstrate a 1.5 by 0.5 by 1.8 cm
(volume = 0.7 cm^3) fluid collection with enhancing margins. This
may represent extension of the distal radioulnar joint given how it
extends towards the dorsal portion of the joint. Infection is not
excluded.

Dorsal to the proximal fourth and fifth metacarpals, a 0.6 by 0.3 by
1.1 cm (volume = 0.1 cm^3) fluid collection with enhancing margins
is present, probably a small abscess.

Improvement in the extensive subcutaneous edema and enhancement in
the wrist and tracking into the dorsal hand.
IMPRESSION: 1. Substantial improvement with interval resolution of the dominant
fluid collections shown on the prior exam, and substantial reduction
in the degree of subcutaneous edema and enhancement and generalized
soft tissue edema and enhancement. However, there is still
substantial synovitis in the distal radioulnar joint, radiocarpal
joint, and midcarpal row region with patchy diffuse marrow edema and
enhancement in the region, probably a combination of chronic
underlying arthropathy and septic joint. In particular there is a
dorsal effusion of the distal radioulnar joint measuring 0.7 cc with
enhancing margins.
2. Small abscess dorsal to the proximal fourth and fifth
metacarpals, but only about 0.1 cc in volume.
3. SLAC wrist.

## 2023-02-21 ENCOUNTER — Encounter (HOSPITAL_COMMUNITY): Payer: Self-pay

## 2023-02-21 ENCOUNTER — Inpatient Hospital Stay (HOSPITAL_COMMUNITY)
Admission: RE | Admit: 2023-02-21 | Discharge: 2023-02-21 | Discharge status: Home / Self Care | Payer: BC Managed Care – PPO | Source: Ambulatory Visit | Attending: Emergency Medicine | Admitting: Emergency Medicine

## 2023-02-21 VITALS — BP 163/77 | HR 74 | Temp 98.1°F | Resp 16

## 2023-02-21 DIAGNOSIS — M542 Cervicalgia: Secondary | ICD-10-CM | POA: Diagnosis not present

## 2023-02-21 DIAGNOSIS — M545 Low back pain, unspecified: Secondary | ICD-10-CM

## 2023-02-21 MED ORDER — LIDOCAINE 5 % EX PTCH: 1.0000 | MEDICATED_PATCH | CUTANEOUS | 0 refills | Status: AC

## 2023-02-21 MED ORDER — BACLOFEN 5 MG PO TABS: 5.0000 mg | ORAL_TABLET | Freq: Two times a day (BID) | ORAL | 0 refills | Status: DC
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
## 2023-02-21 NOTE — Discharge Instructions (Addendum)
Tylenol can be used every 4-6 hours. You can take the muscle relaxer (Baclofen) twice daily. If the medication makes you drowsy, take only at bed time. Try lidocaine patch for topical relief (or SalonPas, IcyHot, Biofreeze) Gentle stretching if it does not cause pain  Unfortunately you may feel worse tomorrow, and then notice slow improvement over the week. Please return if needed.

## 2023-03-24 ENCOUNTER — Other Ambulatory Visit: Payer: Self-pay | Admitting: Nurse Practitioner

## 2023-03-24 DIAGNOSIS — Z1231 Encounter for screening mammogram for malignant neoplasm of breast: Secondary | ICD-10-CM

## 2023-09-28 ENCOUNTER — Ambulatory Visit (HOSPITAL_COMMUNITY)
Admission: EM | Admit: 2023-09-28 | Discharge: 2023-09-28 | Disposition: A | Discharge status: Home / Self Care | Attending: Family Medicine | Admitting: Family Medicine

## 2023-09-28 ENCOUNTER — Ambulatory Visit (INDEPENDENT_AMBULATORY_CARE_PROVIDER_SITE_OTHER)

## 2023-09-28 ENCOUNTER — Encounter (HOSPITAL_COMMUNITY): Payer: Self-pay | Admitting: Emergency Medicine

## 2023-09-28 DIAGNOSIS — M545 Low back pain, unspecified: Secondary | ICD-10-CM

## 2023-09-28 DIAGNOSIS — M5416 Radiculopathy, lumbar region: Secondary | ICD-10-CM

## 2023-09-28 DIAGNOSIS — S39012A Strain of muscle, fascia and tendon of lower back, initial encounter: Secondary | ICD-10-CM

## 2023-09-28 MED ORDER — CYCLOBENZAPRINE HCL 10 MG PO TABS: 10.0000 mg | ORAL_TABLET | Freq: Two times a day (BID) | ORAL | 0 refills | Status: AC | PRN

## 2023-09-28 MED ORDER — MELOXICAM 15 MG PO TABS
ORAL_TABLET | ORAL | 0 refills | Status: AC
Start: 2023-09-28 — End: ?

## 2023-09-28 NOTE — ED Provider Notes (Signed)
 [facility]    CSN: [id number] Arrival date & time: 09/28/23  4098      History   Chief Complaint Chief Complaint  Patient presents with   Spasms    HPI Christy Higgins is a 73 y.o. female.   Patient presents with complaints of a muscle spasm affecting the right lower extremity, which she describes as a tight, cramping sensation that radiates down the leg. Symptoms have been present for several days and have persisted despite conservative measures. She reports trying both heat and ice without significant relief. She has not taken any over-the-counter or prescription medications for pain management. She denies any known injury, recent increase in physical activity, numbness, or weakness.      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   Diverticulosis of colon without hemorrhage    Heme positive stool    Normocytic anemia    Gastritis    Acute blood loss anemia    CHF (congestive heart failure) (HCC) 02/21/2021   NSTEMI (non-ST elevated myocardial infarction) (HCC)    Hand abscess    Fever    Eosinophilic pneumonia (HCC)    Acute CHF (congestive heart failure) (HCC) 02/20/2021   Demand ischemia (HCC) 02/20/2021   HTN (hypertension) 02/20/2021   DM (diabetes mellitus) (HCC) 02/20/2021   Acute respiratory failure with hypoxia (HCC) 02/20/2021   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   Septic arthritis (HCC) 02/06/2021   AKI (acute kidney injury) (HCC)    Hyponatremia    Cellulitis 02/04/2021    Past Surgical History:  Procedure Laterality Date   BIOPSY  02/27/2021   Procedure: BIOPSY;  Surgeon: Annis Kinder, DO;  Location: [facility];   Service: Gastroenterology;;   CESAREAN SECTION     COLONOSCOPY Left 02/27/2021   Procedure: COLONOSCOPY;  Surgeon: Annis Kinder, DO;  Location: [facility];  Service: Gastroenterology;  Laterality: Left;   ESOPHAGOGASTRODUODENOSCOPY Left 02/27/2021   Procedure: ESOPHAGOGASTRODUODENOSCOPY (EGD);  Surgeon: Annis Kinder, DO;  Location: [facility] ENDOSCOPY;  Service: Gastroenterology;  Laterality: Left;   I & D EXTREMITY Right 02/06/2021   Procedure: IRRIGATION AND DEBRIDEMENT RIGHT HAND;  Surgeon: Arvil Birks, MD;  Location: [facility] OR;  Service: Orthopedics;  Laterality: Right;   I & D EXTREMITY Right 02/08/2021   Procedure: IRRIGATION AND DEBRIDEMENT EXTREMITY;  Surgeon: Ltanya Rummer, MD;  Location: [facility] OR;  Service: Orthopedics;  Laterality: Right;   I & D EXTREMITY Right 02/11/2021   Procedure: IRRIGATION AND DEBRIDEMENT RIGHT WRIST;  Surgeon: Arvil Birks, MD;  Location: [facility] OR;  Service: Orthopedics;  Laterality: Right;   TONSILLECTOMY      OB History   No obstetric history on file.      Home Medications    Prior to Admission medications   Medication Sig Start Date End Date Taking? Authorizing Provider  amLODipine (NORVASC) 5 MG tablet Take 5 mg by mouth daily. 08/04/23  Yes [provider]  cyclobenzaprine (FLEXERIL) 10 MG tablet Take 1 tablet (10 mg total) by mouth 2 (two) times daily as needed for muscle spasms. 09/28/23  Yes Maylani Embree, MD  meloxicam (MOBIC) 15 MG tablet Take 1 tablet daily with food  for 14 days. Then take as needed. 09/28/23  Yes Iver Fehrenbach, MD  Vitamin D, Ergocalciferol, (DRISDOL) 1.25 MG (50000 UNIT) CAPS capsule Take 50,000 Units by mouth once a week. 09/25/23  Yes [provider]  acetaminophen -codeine (TYLENOL  #3) 300-30 MG tablet Take 1 tablet by mouth every 4 (four) hours as needed for moderate pain.    [provider]  atorvastatin  (LIPITOR ) 40 MG tablet Take 1 tablet (40 mg total) by mouth daily. 03/01/21 03/31/21  Modena Andes, MD   baclofen  5 MG TABS Take 1 tablet (5 mg total) by mouth 2 (two) times daily. 02/21/23   Rising, Ivette Marks, PA-C  lidocaine  (LIDODERM ) 5 % Place 1 patch onto the skin daily. Remove & Discard patch within 12 hours 02/21/23   Rising, Ivette Marks, PA-C  losartan  (COZAAR ) 100 MG tablet Take 100 mg by mouth daily. 12/14/18   [provider]  metFORMIN (GLUCOPHAGE-XR) 750 MG 24 hr tablet Take 750 mg by mouth every evening. 12/14/18   [provider]  pantoprazole  (PROTONIX ) 40 MG tablet Take 1 tablet (40 mg total) by mouth 2 (two) times daily. 02/28/21 04/11/21  Modena Andes, MD    Family History Family History  Problem Relation Age of Onset   Hypertension Mother    Diabetes Father    Hypertension Father    Hypertension Sister    Diabetes Sister    Hypertension Sister    Diabetes Sister    Hypertension Sister    Diabetes Sister     Social History Social History   Tobacco Use   Smoking status: Never   Smokeless tobacco: Never  Substance Use Topics   Alcohol use: Yes    Comment: beer occasionally   Drug use: No     Allergies   Patient has no known allergies.   Review of Systems Review of Systems   Physical Exam Triage Vital Signs ED Triage Vitals  Encounter Vitals Group     BP 09/28/23 1034 (!) 145/77     Systolic BP Percentile --      Diastolic BP Percentile --      Pulse Rate 09/28/23 1034 81     Resp 09/28/23 1034 17     Temp 09/28/23 1034 97.9 F (36.6 C)     Temp Source 09/28/23 1034 Oral     SpO2 09/28/23 1034 96 %     Weight --      Height --      Head Circumference --      Peak Flow --      Pain Score 09/28/23 1033 0     Pain Loc --      Pain Education --      Exclude from Growth Chart --    No data found.  Updated Vital Signs BP (!) 145/77 (BP Location: Right Arm)   Pulse 81   Temp 97.9 F (36.6 C) (Oral)   Resp 17   SpO2 96%   Visual Acuity Right Eye Distance:   Left Eye Distance:   Bilateral Distance:    Right Eye Near:    Left Eye Near:    Bilateral Near:     Physical Exam Inspection reveals no gross abnormalities.  There is tenderness of the paraspinal muscles at the lumbar spine on the right side.  No tenderness over the spinous process itself.  Straight leg test is negative on the right side.  There is some pain with flexion extension of the lumbar spine.   UC Treatments /  Results  Labs (all labs ordered are listed, but only abnormal results are displayed) Labs Reviewed - No data to display  EKG   Radiology DG Lumbar Spine Complete Result Date: 09/28/2023 CLINICAL DATA:  Back pain EXAM: LUMBAR SPINE - COMPLETE 4+ VIEW COMPARISON:  July 15, 2010 FINDINGS: Multilevel degenerative disc disease with narrowing T12-L1, L1-L2 L3-L4, L4-L5 disc spaces with diffuse anterior osteophytes and mild posterior bony spondylosis. There is advanced degenerative facet disease  L3-L4 L4-5 and L5-S1 No spondylolysis or spondylolisthesis. No obvious fractures IMPRESSION: Multilevel degenerative disc disease and facet disease. Electronically Signed   By: Fredrich Jefferson M.D.   On: 09/28/2023 11:16    Procedures Procedures (including critical care time)  Medications Ordered in UC Medications - No data to display  Initial Impression / Assessment and Plan / UC Course  I have reviewed the triage vital signs and the nursing notes.  Pertinent labs & imaging results that were available during my care of the patient were reviewed by me and considered in my medical decision making (see chart for details).     Patient likely dealing with 2 issues at this time, suspect one of them to be a lumbar strain as well as some noted lumbar radiculitis.  Patient does have degeneration of the lumbar strain as well as some facet arthropathy.  Will go ahead and recommend meloxicam daily for the next 14 days for the lumbar strain.  Also recommend Flexeril as well as heat over the affected area.  At this time, patient would benefit from following  up with her PCP regarding her lumbar degeneration as I suspect she will likely lead to a ESI as this was an acute on chronic issue. Final Clinical Impressions(s) / UC Diagnoses   Final diagnoses:  Acute bilateral low back pain without sciatica     Discharge Instructions      You have been diagnosed with a lumbar strain, which is a common cause of lower back pain resulting from overstretched or injured muscles. This condition often improves with conservative management.  Treatment Plan:  Medication:  Meloxicam (NSAID): Take one tablet daily for 14 days, then use as needed for pain or inflammation. Take with food to reduce stomach upset.  Flexeril (Cyclobenzaprine): Take as needed for muscle spasms, especially at night. Do not drive or operate heavy machinery while taking this medication, as it may cause drowsiness.  Heat Therapy:  Apply heat to the lower back area for 15-20 minutes at a time, 2-3 times daily, especially in the morning or before bed. Use a heating pad or warm compress. Avoid direct contact with the skin to prevent burns.  Activity:  Avoid heavy lifting, twisting motions, or prolonged sitting.  Gentle movement and walking are encouraged to prevent stiffness.  Avoid bed rest beyond short periods, as it can delay recovery.  Follow-Up:  If symptoms persist beyond 2 weeks, worsen, or if you develop numbness, weakness, or loss of bowel/bladder control, seek medical attention promptly.   ED Prescriptions     Medication Sig Dispense Auth. Provider   meloxicam (MOBIC) 15 MG tablet Take 1 tablet daily with food for 14 days. Then take as needed. 20 tablet Deneise Getty, MD   cyclobenzaprine (FLEXERIL) 10 MG tablet Take 1 tablet (10 mg total) by mouth 2 (two) times daily as needed for muscle spasms. 20 tablet Jenasis Straley, MD      PDMP not reviewed this encounter.   Jude Norton, MD 09/28/23 1128

## 2023-09-28 NOTE — ED Triage Notes (Signed)
 Pt reports having "muscle spasm that runs down right leg" for several days. Tried heat and ice. Hast taken anything for pain.

## 2023-09-28 NOTE — Discharge Instructions (Addendum)
 You have been diagnosed with a lumbar strain, which is a common cause of lower back pain resulting from overstretched or injured muscles. This condition often improves with conservative management.  Treatment Plan:  Medication:  Meloxicam (NSAID): Take one tablet daily for 14 days, then use as needed for pain or inflammation. Take with food to reduce stomach upset.  Flexeril (Cyclobenzaprine): Take as needed for muscle spasms, especially at night. Do not drive or operate heavy machinery while taking this medication, as it may cause drowsiness.  Heat Therapy:  Apply heat to the lower back area for 15-20 minutes at a time, 2-3 times daily, especially in the morning or before bed. Use a heating pad or warm compress. Avoid direct contact with the skin to prevent burns.  Activity:  Avoid heavy lifting, twisting motions, or prolonged sitting.  Gentle movement and walking are encouraged to prevent stiffness.  Avoid bed rest beyond short periods, as it can delay recovery.  Follow-Up:  If symptoms persist beyond 2 weeks, worsen, or if you develop numbness, weakness, or loss of bowel/bladder control, seek medical attention promptly.

## 2024-05-07 ENCOUNTER — Encounter (HOSPITAL_COMMUNITY): Payer: Self-pay

## 2024-05-07 ENCOUNTER — Ambulatory Visit (HOSPITAL_COMMUNITY): Admission: EM | Admit: 2024-05-07 | Discharge: 2024-05-07 | Disposition: A | Discharge status: Home / Self Care

## 2024-05-07 DIAGNOSIS — M5442 Lumbago with sciatica, left side: Secondary | ICD-10-CM | POA: Diagnosis not present

## 2024-05-07 DIAGNOSIS — M5441 Lumbago with sciatica, right side: Secondary | ICD-10-CM

## 2024-05-07 MED ORDER — KETOROLAC TROMETHAMINE 30 MG/ML IJ SOLN
15.0000 mg | Freq: Once | INTRAMUSCULAR | Status: AC
  Administered 2024-05-07: 15 mg via INTRAMUSCULAR

## 2024-05-07 MED ORDER — DICLOFENAC SODIUM 50 MG PO TBEC: 50.0000 mg | DELAYED_RELEASE_TABLET | Freq: Two times a day (BID) | ORAL | 1 refills | Status: AC

## 2024-05-07 MED ORDER — KETOROLAC TROMETHAMINE 30 MG/ML IJ SOLN
INTRAMUSCULAR | Status: AC
  Filled 2024-05-07: qty 1

## 2024-05-07 NOTE — ED Provider Notes (Addendum)
 " [facility]  Note:  This document was prepared using Dragon voice recognition software and may include unintentional dictation errors.  MRN: [id number] DOB: 1951/03/05  Subjective:   Christy Higgins is a 74 y.o. female presenting for intermittent lower back pain with radiation to posterior legs bilaterally and occasional chest wall muscle spasms that started last Tuesday.  Patient reports that pain is pulsating and sharp.  Denies any recent falls, injury, back surgery, trauma.  Patient reports that she was previously seen for back pain by her PCP who prescribed her cyclobenzaprine , patient has been taking medication with minimal improvement to symptoms.  Patient denies any numbness or tingling in the lower extremities.  Patient states that pain when it radiates to her legs goes down the back of her thighs but no radiation below the knee.  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   Finger joint swelling, right 03/25/2021   High cholesterol    Hypertension      Past Surgical History:  Procedure Laterality Date   BIOPSY  02/27/2021   Procedure: BIOPSY;  Surgeon: San Sandor GAILS, DO;  Location: [facility] ENDOSCOPY;  Service: Gastroenterology;;   CESAREAN SECTION     COLONOSCOPY Left 02/27/2021   Procedure: COLONOSCOPY;  Surgeon: San Sandor GAILS, DO;  Location: [facility] ENDOSCOPY;  Service: Gastroenterology;  Laterality: Left;   ESOPHAGOGASTRODUODENOSCOPY Left 02/27/2021   Procedure: ESOPHAGOGASTRODUODENOSCOPY (EGD);  Surgeon: San Sandor GAILS, DO;  Location: [facility];  Service: Gastroenterology;  Laterality: Left;   I & D EXTREMITY Right 02/06/2021   Procedure: IRRIGATION AND DEBRIDEMENT RIGHT HAND;  Surgeon: Shari Easter, MD;  Location: [facility] OR;  Service: Orthopedics;  Laterality: Right;   I & D EXTREMITY Right 02/08/2021   Procedure: IRRIGATION AND  DEBRIDEMENT EXTREMITY;  Surgeon: Alyse Agent, MD;  Location: [facility] OR;  Service: Orthopedics;  Laterality: Right;   I & D EXTREMITY Right 02/11/2021   Procedure: IRRIGATION AND DEBRIDEMENT RIGHT WRIST;  Surgeon: Shari Easter, MD;  Location: [facility] OR;  Service: Orthopedics;  Laterality: Right;   TONSILLECTOMY      Family History  Problem Relation Age of Onset   Hypertension Mother    Diabetes Father    Hypertension Father    Hypertension Sister    Diabetes Sister    Hypertension Sister    Diabetes Sister    Hypertension Sister    Diabetes Sister     Social History[3]  ROS Refer to HPI for ROS details.  Objective:    Vitals: BP (!) 132/55 (BP Location: Left Arm)   Pulse 99   Temp 98.7 F (37.1 C) (Oral)   Resp 16   SpO2 95%   Physical Exam Vitals and nursing note reviewed.  Constitutional:      General: She is not in acute distress.    Appearance: Normal appearance. She is well-developed. She is not ill-appearing or toxic-appearing.  HENT:     Head: Normocephalic and atraumatic.  Cardiovascular:     Rate and Rhythm: Normal rate.  Pulmonary:     Effort: Pulmonary effort is normal. No respiratory distress.     Breath sounds: No stridor. No wheezing.  Chest:     Chest wall: No tenderness.  Musculoskeletal:     Cervical back: Normal range of motion and neck supple.     Lumbar back: Spasms and tenderness present. No swelling, deformity or bony  tenderness. Normal range of motion. Positive right straight leg raise test and positive left straight leg raise test.  Skin:    General: Skin is warm and dry.  Neurological:     General: No focal deficit present.     Mental Status: She is alert and oriented to person, place, and time.  Psychiatric:        Mood and Affect: Mood normal.        Behavior: Behavior normal.     Procedures  No results found for this or any previous visit (from the past 24 hours).  Assessment and Plan :     Discharge Instructions        1. Acute bilateral low back pain with bilateral sciatica (Primary) - EKG 12-Lead completed today in UC due to intermittent chest muscle spasm and history of CHF and NSTEMI.  EKG reveals sinus rhythm with occasional PVCs.  Ventricular rate of 96 bpm, no STEMI.  Otherwise normal EKG. - ketorolac  (TORADOL ) 30 MG/ML injection 15 mg given in UC for acute back pain and muscle spasm.  Dose adjustment based on previous history of elevated creatinine and age dependent. - diclofenac  (VOLTAREN ) 50 MG EC tablet; Take 1 tablet (50 mg total) by mouth 2 (two) times daily.  Dispense: 30 tablet; Refill: 1  -Continue to monitor symptoms for any change in severity if there is any escalation of current symptoms or development of new symptoms follow-up in ER for further evaluation and management.      Christy Higgins    Christy Higgins, [place] B, NP 05/07/24 0930     [1] No current facility-administered medications for this encounter.  Current Outpatient Medications:    amLODipine (NORVASC) 5 MG tablet, Take 5 mg by mouth daily., Disp: , Rfl:    atorvastatin  (LIPITOR ) 40 MG tablet, Take 1 tablet (40 mg total) by mouth daily., Disp: 30 tablet, Rfl: 0   cyclobenzaprine  (FLEXERIL ) 10 MG tablet, Take 1 tablet (10 mg total) by mouth 2 (two) times daily as needed for muscle spasms., Disp: 20 tablet, Rfl: 0   diclofenac  (VOLTAREN ) 50 MG EC tablet, Take 1 tablet (50 mg total) by mouth 2 (two) times daily., Disp: 30 tablet, Rfl: 1   lidocaine  (LIDODERM ) 5 %, Place 1 patch onto the skin daily. Remove & Discard patch within 12 hours, Disp: 14 patch, Rfl: 0   losartan  (COZAAR ) 100 MG tablet, Take 100 mg by mouth daily., Disp: , Rfl:    meloxicam  (MOBIC ) 15 MG tablet, Take 1 tablet daily with food for 14 days. Then take as needed., Disp: 20 tablet, Rfl: 0   metFORMIN (GLUCOPHAGE-XR) 750 MG 24 hr tablet, Take 750 mg by mouth every evening., Disp: , Rfl:    Vitamin D, Ergocalciferol, (DRISDOL) 1.25 MG (50000 UNIT) CAPS  capsule, Take 50,000 Units by mouth once a week., Disp: , Rfl:    acetaminophen -codeine (TYLENOL  #3) 300-30 MG tablet, Take 1 tablet by mouth every 4 (four) hours as needed for moderate pain., Disp: , Rfl:  [2] No Known Allergies [3]  Social History Tobacco Use   Smoking status: Never   Smokeless tobacco: Never  Vaping Use   Vaping status: Never Used  Substance Use Topics   Alcohol use: Yes    Comment: beer occasionally   Drug use: No     Aurea Goodell B, NP 05/07/24 0946  "

## 2024-05-07 NOTE — ED Triage Notes (Addendum)
 Pt c/o of intermittent lower back and sometimes chest spasms since last Tuesday. Pt describes as pulsating, sharp pain. Pt reports that pain radiates down back of thighs. Denies any numbness or tingling in lower extremities. Patient has been alternating heat and ice with no relief. PCP prescribed Cyclobenzaprine , pt has been taking as prescribed with minimal relief. No hx of falls, injury, or back surgeries.

## 2024-05-07 NOTE — Discharge Instructions (Addendum)
" °  1. Acute bilateral low back pain with bilateral sciatica (Primary) - EKG 12-Lead completed today in UC due to intermittent chest muscle spasm and history of CHF and NSTEMI.  EKG reveals sinus rhythm with occasional PVCs.  Ventricular rate of 96 bpm, no STEMI.  Otherwise normal EKG. - ketorolac  (TORADOL ) 30 MG/ML injection 15 mg given in UC for acute back pain and muscle spasm.  Dose adjustment based on previous history of elevated creatinine and age dependent. - diclofenac  (VOLTAREN ) 50 MG EC tablet; Take 1 tablet (50 mg total) by mouth 2 (two) times daily.  Dispense: 30 tablet; Refill: 1  -Continue to monitor symptoms for any change in severity if there is any escalation of current symptoms or development of new symptoms follow-up in ER for further evaluation and management. "
